# Patient Record
Sex: Male | Born: 1945
Health system: Southern US, Community
[De-identification: ages and names within clinical notes are randomized; demographics above are authoritative.]

## PROBLEM LIST (undated history)

## (undated) DIAGNOSIS — E669 Obesity, unspecified: Secondary | ICD-10-CM

## (undated) DIAGNOSIS — Z87442 Personal history of urinary calculi: Secondary | ICD-10-CM

## (undated) DIAGNOSIS — G8929 Other chronic pain: Secondary | ICD-10-CM

## (undated) DIAGNOSIS — F5104 Psychophysiologic insomnia: Secondary | ICD-10-CM

## (undated) DIAGNOSIS — E781 Pure hyperglyceridemia: Secondary | ICD-10-CM

## (undated) DIAGNOSIS — R109 Unspecified abdominal pain: Secondary | ICD-10-CM

## (undated) DIAGNOSIS — M1712 Unilateral primary osteoarthritis, left knee: Secondary | ICD-10-CM

## (undated) DIAGNOSIS — N521 Erectile dysfunction due to diseases classified elsewhere: Secondary | ICD-10-CM

## (undated) DIAGNOSIS — H11002 Unspecified pterygium of left eye: Secondary | ICD-10-CM

## (undated) DIAGNOSIS — M545 Low back pain: Secondary | ICD-10-CM

## (undated) DIAGNOSIS — M1120 Other chondrocalcinosis, unspecified site: Secondary | ICD-10-CM

## (undated) DIAGNOSIS — E1140 Type 2 diabetes mellitus with diabetic neuropathy, unspecified: Principal | ICD-10-CM

## (undated) DIAGNOSIS — K56609 Unspecified intestinal obstruction, unspecified as to partial versus complete obstruction: Secondary | ICD-10-CM

## (undated) DIAGNOSIS — I700 Atherosclerosis of aorta: Secondary | ICD-10-CM

## (undated) DIAGNOSIS — R1032 Left lower quadrant pain: Secondary | ICD-10-CM

## (undated) DIAGNOSIS — K219 Gastro-esophageal reflux disease without esophagitis: Secondary | ICD-10-CM

## (undated) DIAGNOSIS — I10 Essential (primary) hypertension: Secondary | ICD-10-CM

## (undated) DIAGNOSIS — J302 Other seasonal allergic rhinitis: Secondary | ICD-10-CM

## (undated) HISTORY — DX: Other seasonal allergic rhinitis: J30.2

## (undated) HISTORY — PX: CATARACT EXTRACTION W/ INTRAOCULAR LENS  IMPLANT, BILATERAL: SHX1307

## (undated) HISTORY — PX: HERNIA REPAIR: SHX51

## (undated) HISTORY — DX: Psychophysiologic insomnia: F51.04

## (undated) HISTORY — DX: Unspecified pterygium of left eye: H11.002

## (undated) HISTORY — DX: Essential (primary) hypertension: I10

## (undated) HISTORY — DX: Unspecified abdominal pain: R10.9

## (undated) HISTORY — DX: Low back pain: M54.5

## (undated) HISTORY — DX: Type 2 diabetes mellitus with diabetic neuropathy, unspecified: E11.40

## (undated) HISTORY — DX: Unilateral primary osteoarthritis, left knee: M17.12

## (undated) HISTORY — DX: Obesity, unspecified: E66.9

## (undated) HISTORY — DX: Pure hyperglyceridemia: E78.1

## (undated) HISTORY — DX: Unspecified intestinal obstruction, unspecified as to partial versus complete obstruction: K56.609

## (undated) HISTORY — DX: Gastro-esophageal reflux disease without esophagitis: K21.9

## (undated) HISTORY — DX: Other chronic pain: G89.29

## (undated) HISTORY — PX: ABDOMINAL SURGERY: SHX537

## (undated) HISTORY — PX: APPENDECTOMY: SHX54

## (undated) HISTORY — DX: Other chondrocalcinosis, unspecified site: M11.20

## (undated) HISTORY — DX: Left lower quadrant pain: R10.32

## (undated) HISTORY — DX: Erectile dysfunction due to diseases classified elsewhere: N52.1

---
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
## 1993-05-08 HISTORY — PX: ABDOMINAL ADHESION SURGERY: SHX90

## 1997-09-09 ENCOUNTER — Encounter: Admission: RE | Admit: 1997-09-09 | Discharge: 1997-09-09 | Payer: Self-pay | Admitting: Hematology and Oncology

## 1997-10-27 ENCOUNTER — Observation Stay (HOSPITAL_COMMUNITY): Admission: EM | Admit: 1997-10-27 | Discharge: 1997-10-28 | Payer: Self-pay | Admitting: Emergency Medicine

## 1997-10-28 ENCOUNTER — Encounter: Admission: RE | Admit: 1997-10-28 | Discharge: 1997-10-28 | Payer: Self-pay | Admitting: Internal Medicine

## 1998-05-08 HISTORY — PX: VENTRAL HERNIA REPAIR: SHX424

## 1998-10-02 ENCOUNTER — Inpatient Hospital Stay (HOSPITAL_COMMUNITY): Admission: EM | Admit: 1998-10-02 | Discharge: 1998-10-04 | Payer: Self-pay | Admitting: Emergency Medicine

## 1998-10-02 ENCOUNTER — Encounter: Payer: Self-pay | Admitting: Emergency Medicine

## 1998-10-03 ENCOUNTER — Encounter: Payer: Self-pay | Admitting: General Surgery

## 1998-10-29 ENCOUNTER — Inpatient Hospital Stay (HOSPITAL_COMMUNITY): Admission: RE | Admit: 1998-10-29 | Discharge: 1998-11-02 | Payer: Self-pay | Admitting: Surgery

## 1999-09-21 ENCOUNTER — Encounter: Admission: RE | Admit: 1999-09-21 | Discharge: 1999-09-21 | Payer: Self-pay | Admitting: Hematology and Oncology

## 1999-10-25 ENCOUNTER — Encounter: Payer: Self-pay | Admitting: Emergency Medicine

## 1999-10-25 ENCOUNTER — Inpatient Hospital Stay (HOSPITAL_COMMUNITY): Admission: EM | Admit: 1999-10-25 | Discharge: 1999-10-26 | Payer: Self-pay | Admitting: Emergency Medicine

## 1999-10-26 ENCOUNTER — Encounter: Payer: Self-pay | Admitting: Gastroenterology

## 2000-05-02 ENCOUNTER — Encounter: Admission: RE | Admit: 2000-05-02 | Discharge: 2000-05-02 | Payer: Self-pay | Admitting: Internal Medicine

## 2000-05-18 ENCOUNTER — Encounter: Admission: RE | Admit: 2000-05-18 | Discharge: 2000-05-18 | Payer: Self-pay

## 2001-03-28 ENCOUNTER — Encounter: Payer: Self-pay | Admitting: Orthopaedic Surgery

## 2001-03-28 ENCOUNTER — Ambulatory Visit (HOSPITAL_BASED_OUTPATIENT_CLINIC_OR_DEPARTMENT_OTHER): Admission: RE | Admit: 2001-03-28 | Discharge: 2001-03-28 | Payer: Self-pay | Admitting: Orthopaedic Surgery

## 2001-03-28 HISTORY — PX: SHOULDER ARTHROSCOPY: SHX128

## 2001-05-08 HISTORY — PX: ROTATOR CUFF REPAIR: SHX139

## 2001-06-12 ENCOUNTER — Encounter: Admission: RE | Admit: 2001-06-12 | Discharge: 2001-06-12 | Payer: Self-pay | Admitting: Internal Medicine

## 2001-06-20 ENCOUNTER — Encounter: Admission: RE | Admit: 2001-06-20 | Discharge: 2001-06-20 | Payer: Self-pay | Admitting: Internal Medicine

## 2001-09-04 ENCOUNTER — Encounter: Admission: RE | Admit: 2001-09-04 | Discharge: 2001-09-04 | Payer: Self-pay

## 2001-09-09 ENCOUNTER — Encounter: Admission: RE | Admit: 2001-09-09 | Discharge: 2001-09-09 | Payer: Self-pay

## 2002-03-20 ENCOUNTER — Encounter: Admission: RE | Admit: 2002-03-20 | Discharge: 2002-03-20 | Payer: Self-pay | Admitting: Internal Medicine

## 2002-03-24 ENCOUNTER — Encounter: Admission: RE | Admit: 2002-03-24 | Discharge: 2002-03-24 | Payer: Self-pay | Admitting: Internal Medicine

## 2002-05-08 HISTORY — PX: PTERYGIUM EXCISION: SHX2273

## 2002-06-20 ENCOUNTER — Encounter: Admission: RE | Admit: 2002-06-20 | Discharge: 2002-06-20 | Payer: Self-pay | Admitting: Internal Medicine

## 2002-07-03 ENCOUNTER — Encounter: Admission: RE | Admit: 2002-07-03 | Discharge: 2002-07-03 | Payer: Self-pay | Admitting: Internal Medicine

## 2003-04-08 ENCOUNTER — Encounter: Admission: RE | Admit: 2003-04-08 | Discharge: 2003-04-08 | Payer: Self-pay | Admitting: Internal Medicine

## 2003-04-27 ENCOUNTER — Encounter (INDEPENDENT_AMBULATORY_CARE_PROVIDER_SITE_OTHER): Payer: Self-pay | Admitting: *Deleted

## 2003-04-27 LAB — CONVERTED CEMR LAB: PSA: 0.67 ng/mL

## 2003-05-21 ENCOUNTER — Encounter: Admission: RE | Admit: 2003-05-21 | Discharge: 2003-05-21 | Payer: Self-pay | Admitting: Internal Medicine

## 2004-03-17 ENCOUNTER — Ambulatory Visit: Payer: Self-pay | Admitting: Internal Medicine

## 2004-03-23 ENCOUNTER — Ambulatory Visit: Payer: Self-pay | Admitting: Internal Medicine

## 2004-08-10 ENCOUNTER — Ambulatory Visit: Payer: Self-pay | Admitting: Internal Medicine

## 2004-12-12 ENCOUNTER — Ambulatory Visit (HOSPITAL_COMMUNITY): Admission: RE | Admit: 2004-12-12 | Discharge: 2004-12-12 | Payer: Self-pay | Admitting: Internal Medicine

## 2004-12-12 ENCOUNTER — Ambulatory Visit: Payer: Self-pay | Admitting: Internal Medicine

## 2004-12-19 ENCOUNTER — Ambulatory Visit: Payer: Self-pay | Admitting: Internal Medicine

## 2005-01-03 ENCOUNTER — Encounter (INDEPENDENT_AMBULATORY_CARE_PROVIDER_SITE_OTHER): Payer: Self-pay | Admitting: Cardiology

## 2005-01-03 ENCOUNTER — Ambulatory Visit: Payer: Self-pay | Admitting: Internal Medicine

## 2005-01-03 ENCOUNTER — Ambulatory Visit (HOSPITAL_COMMUNITY): Admission: RE | Admit: 2005-01-03 | Discharge: 2005-01-03 | Payer: Self-pay | Admitting: Internal Medicine

## 2005-02-02 ENCOUNTER — Ambulatory Visit (HOSPITAL_COMMUNITY): Admission: RE | Admit: 2005-02-02 | Discharge: 2005-02-02 | Payer: Self-pay | Admitting: Internal Medicine

## 2005-02-02 ENCOUNTER — Ambulatory Visit: Payer: Self-pay | Admitting: Internal Medicine

## 2005-02-08 ENCOUNTER — Ambulatory Visit (HOSPITAL_COMMUNITY): Admission: RE | Admit: 2005-02-08 | Discharge: 2005-02-08 | Payer: Self-pay | Admitting: Internal Medicine

## 2005-04-13 ENCOUNTER — Ambulatory Visit: Payer: Self-pay | Admitting: Internal Medicine

## 2005-06-26 ENCOUNTER — Ambulatory Visit: Payer: Self-pay | Admitting: Internal Medicine

## 2005-06-30 ENCOUNTER — Ambulatory Visit: Payer: Self-pay | Admitting: Internal Medicine

## 2005-10-06 ENCOUNTER — Ambulatory Visit: Payer: Self-pay | Admitting: Internal Medicine

## 2006-02-12 ENCOUNTER — Ambulatory Visit: Payer: Self-pay | Admitting: Internal Medicine

## 2006-02-15 ENCOUNTER — Ambulatory Visit: Payer: Self-pay | Admitting: Internal Medicine

## 2006-02-26 ENCOUNTER — Encounter (INDEPENDENT_AMBULATORY_CARE_PROVIDER_SITE_OTHER): Payer: Self-pay | Admitting: *Deleted

## 2006-02-26 DIAGNOSIS — K56609 Unspecified intestinal obstruction, unspecified as to partial versus complete obstruction: Secondary | ICD-10-CM | POA: Insufficient documentation

## 2006-02-26 DIAGNOSIS — N521 Erectile dysfunction due to diseases classified elsewhere: Secondary | ICD-10-CM | POA: Insufficient documentation

## 2006-02-26 DIAGNOSIS — E781 Pure hyperglyceridemia: Secondary | ICD-10-CM

## 2006-02-26 DIAGNOSIS — E7800 Pure hypercholesterolemia, unspecified: Secondary | ICD-10-CM

## 2006-02-26 DIAGNOSIS — E119 Type 2 diabetes mellitus without complications: Secondary | ICD-10-CM | POA: Insufficient documentation

## 2006-02-26 DIAGNOSIS — N489 Disorder of penis, unspecified: Secondary | ICD-10-CM | POA: Insufficient documentation

## 2006-02-26 DIAGNOSIS — K219 Gastro-esophageal reflux disease without esophagitis: Secondary | ICD-10-CM | POA: Insufficient documentation

## 2006-02-26 DIAGNOSIS — E785 Hyperlipidemia, unspecified: Secondary | ICD-10-CM | POA: Insufficient documentation

## 2006-02-26 DIAGNOSIS — Z8711 Personal history of peptic ulcer disease: Secondary | ICD-10-CM

## 2006-02-26 DIAGNOSIS — E1140 Type 2 diabetes mellitus with diabetic neuropathy, unspecified: Secondary | ICD-10-CM

## 2006-02-26 HISTORY — DX: Erectile dysfunction due to diseases classified elsewhere: N52.1

## 2006-02-26 HISTORY — DX: Gastro-esophageal reflux disease without esophagitis: K21.9

## 2006-02-26 HISTORY — DX: Pure hyperglyceridemia: E78.1

## 2006-09-24 ENCOUNTER — Encounter (INDEPENDENT_AMBULATORY_CARE_PROVIDER_SITE_OTHER): Payer: Self-pay | Admitting: Internal Medicine

## 2006-09-24 ENCOUNTER — Ambulatory Visit: Payer: Self-pay | Admitting: Internal Medicine

## 2006-09-24 LAB — CONVERTED CEMR LAB
ALT: 20 units/L (ref 0–53)
Basophils Absolute: 0 10*3/uL (ref 0.0–0.1)
Basophils Relative: 0 % (ref 0–1)
Blood Glucose, Fingerstick: 119
CO2: 22 meq/L (ref 19–32)
Cholesterol: 141 mg/dL
Creatinine, Ser: 0.85 mg/dL (ref 0.40–1.50)
Eosinophils Absolute: 0.1 10*3/uL (ref 0.0–0.7)
Eosinophils Relative: 1 % (ref 0–5)
HCT: 45.2 % (ref 39.0–52.0)
HDL: 30 mg/dL
Hemoglobin: 14.7 g/dL (ref 13.0–17.0)
Ketones, ur: NEGATIVE mg/dL
Leukocytes, UA: NEGATIVE
Lymphocytes Relative: 35 % (ref 12–46)
MCHC: 32.5 g/dL (ref 30.0–36.0)
MCV: 85.4 fL (ref 78.0–100.0)
Monocytes Absolute: 0.9 10*3/uL — ABNORMAL HIGH (ref 0.2–0.7)
Nitrite: NEGATIVE
RDW: 14 % (ref 11.5–14.0)
Specific Gravity, Urine: 1.033 (ref 1.005–1.03)
Total Bilirubin: 0.4 mg/dL (ref 0.3–1.2)
Total CK: 211 units/L (ref 7–232)
Triglyceride fasting, serum: 250 mg/dL
pH: 5.5 (ref 5.0–8.0)

## 2006-10-26 ENCOUNTER — Ambulatory Visit: Payer: Self-pay | Admitting: Internal Medicine

## 2006-10-26 ENCOUNTER — Encounter (INDEPENDENT_AMBULATORY_CARE_PROVIDER_SITE_OTHER): Payer: Self-pay | Admitting: Internal Medicine

## 2006-10-29 LAB — CONVERTED CEMR LAB
ALT: 19 units/L (ref 0–53)
AST: 16 units/L (ref 0–37)
Albumin: 4.2 g/dL (ref 3.5–5.2)
BUN: 15 mg/dL (ref 6–23)
Calcium: 8.9 mg/dL (ref 8.4–10.5)
Chloride: 107 meq/L (ref 96–112)
Potassium: 4.2 meq/L (ref 3.5–5.3)
Total Protein: 6.9 g/dL (ref 6.0–8.3)

## 2006-11-02 ENCOUNTER — Ambulatory Visit: Payer: Self-pay | Admitting: Internal Medicine

## 2006-11-02 DIAGNOSIS — H11039 Double pterygium of unspecified eye: Secondary | ICD-10-CM

## 2006-11-02 DIAGNOSIS — M1712 Unilateral primary osteoarthritis, left knee: Secondary | ICD-10-CM

## 2006-11-02 HISTORY — DX: Unilateral primary osteoarthritis, left knee: M17.12

## 2006-11-27 ENCOUNTER — Ambulatory Visit (HOSPITAL_COMMUNITY): Admission: RE | Admit: 2006-11-27 | Discharge: 2006-11-27 | Payer: Self-pay | Admitting: Orthopaedic Surgery

## 2006-12-13 ENCOUNTER — Ambulatory Visit (HOSPITAL_BASED_OUTPATIENT_CLINIC_OR_DEPARTMENT_OTHER): Admission: RE | Admit: 2006-12-13 | Discharge: 2006-12-13 | Payer: Self-pay | Admitting: Orthopaedic Surgery

## 2006-12-13 HISTORY — PX: KNEE ARTHROSCOPY: SUR90

## 2007-01-14 ENCOUNTER — Telehealth: Payer: Self-pay | Admitting: *Deleted

## 2007-02-14 ENCOUNTER — Telehealth (INDEPENDENT_AMBULATORY_CARE_PROVIDER_SITE_OTHER): Payer: Self-pay | Admitting: *Deleted

## 2007-04-12 ENCOUNTER — Encounter (INDEPENDENT_AMBULATORY_CARE_PROVIDER_SITE_OTHER): Payer: Self-pay | Admitting: *Deleted

## 2007-07-25 ENCOUNTER — Encounter (INDEPENDENT_AMBULATORY_CARE_PROVIDER_SITE_OTHER): Payer: Self-pay | Admitting: *Deleted

## 2007-07-25 ENCOUNTER — Ambulatory Visit: Payer: Self-pay | Admitting: Internal Medicine

## 2007-07-27 LAB — CONVERTED CEMR LAB
Cholesterol: 255 mg/dL — ABNORMAL HIGH (ref 0–200)
Total CHOL/HDL Ratio: 8.5
Triglycerides: 459 mg/dL — ABNORMAL HIGH (ref <150)

## 2007-09-05 ENCOUNTER — Encounter (INDEPENDENT_AMBULATORY_CARE_PROVIDER_SITE_OTHER): Payer: Self-pay | Admitting: *Deleted

## 2007-09-05 ENCOUNTER — Ambulatory Visit: Payer: Self-pay | Admitting: Internal Medicine

## 2007-09-05 DIAGNOSIS — G8929 Other chronic pain: Secondary | ICD-10-CM

## 2007-09-05 DIAGNOSIS — M545 Low back pain, unspecified: Secondary | ICD-10-CM

## 2007-09-05 HISTORY — DX: Low back pain, unspecified: M54.50

## 2007-09-05 HISTORY — DX: Other chronic pain: G89.29

## 2007-09-10 LAB — CONVERTED CEMR LAB
CO2: 21 meq/L (ref 19–32)
Cholesterol: 199 mg/dL (ref 0–200)
Creatinine, Ser: 0.97 mg/dL (ref 0.40–1.50)
Glucose, Bld: 120 mg/dL — ABNORMAL HIGH (ref 70–99)
Total Bilirubin: 0.5 mg/dL (ref 0.3–1.2)
Total CHOL/HDL Ratio: 6
Total Protein: 7.2 g/dL (ref 6.0–8.3)
Triglycerides: 290 mg/dL — ABNORMAL HIGH (ref <150)
VLDL: 58 mg/dL — ABNORMAL HIGH (ref 0–40)

## 2007-09-25 ENCOUNTER — Telehealth (INDEPENDENT_AMBULATORY_CARE_PROVIDER_SITE_OTHER): Payer: Self-pay | Admitting: *Deleted

## 2007-10-17 ENCOUNTER — Telehealth (INDEPENDENT_AMBULATORY_CARE_PROVIDER_SITE_OTHER): Payer: Self-pay | Admitting: *Deleted

## 2007-11-29 ENCOUNTER — Ambulatory Visit: Payer: Self-pay | Admitting: Internal Medicine

## 2007-11-29 ENCOUNTER — Encounter: Payer: Self-pay | Admitting: Internal Medicine

## 2007-11-29 DIAGNOSIS — N401 Enlarged prostate with lower urinary tract symptoms: Secondary | ICD-10-CM | POA: Insufficient documentation

## 2007-11-29 DIAGNOSIS — R351 Nocturia: Secondary | ICD-10-CM

## 2007-11-29 LAB — CONVERTED CEMR LAB
AST: 14 units/L (ref 0–37)
Albumin: 4.5 g/dL (ref 3.5–5.2)
BUN: 23 mg/dL (ref 6–23)
Bilirubin Urine: NEGATIVE
Blood Glucose, Fingerstick: 132
CO2: 22 meq/L (ref 19–32)
Calcium: 9.3 mg/dL (ref 8.4–10.5)
Chloride: 106 meq/L (ref 96–112)
Cholesterol: 115 mg/dL (ref 0–200)
Creatinine, Ser: 0.94 mg/dL (ref 0.40–1.50)
Glucose, Bld: 93 mg/dL (ref 70–99)
HCT: 46.2 % (ref 39.0–52.0)
HDL: 34 mg/dL — ABNORMAL LOW (ref >39)
Hemoglobin: 14.8 g/dL (ref 13.0–17.0)
Ketones, ur: NEGATIVE mg/dL
Ketones, urine, test strip: NEGATIVE
Nitrite: NEGATIVE
Protein, U semiquant: NEGATIVE
RBC: 5.41 M/uL (ref 4.22–5.81)
RDW: 13.8 % (ref 11.5–15.5)
Specific Gravity, Urine: 1.026 (ref 1.005–1.03)
Total CHOL/HDL Ratio: 3.4
Triglycerides: 174 mg/dL — ABNORMAL HIGH (ref <150)
pH: 5
pH: 5 (ref 5.0–8.0)

## 2008-03-16 ENCOUNTER — Telehealth: Payer: Self-pay | Admitting: *Deleted

## 2008-04-07 ENCOUNTER — Telehealth: Payer: Self-pay | Admitting: Internal Medicine

## 2008-04-13 ENCOUNTER — Telehealth: Payer: Self-pay | Admitting: Internal Medicine

## 2008-05-27 ENCOUNTER — Ambulatory Visit: Payer: Self-pay | Admitting: Internal Medicine

## 2008-05-27 DIAGNOSIS — N390 Urinary tract infection, site not specified: Secondary | ICD-10-CM

## 2008-05-27 DIAGNOSIS — F528 Other sexual dysfunction not due to a substance or known physiological condition: Secondary | ICD-10-CM | POA: Insufficient documentation

## 2008-05-27 DIAGNOSIS — R350 Frequency of micturition: Secondary | ICD-10-CM

## 2008-05-27 LAB — CONVERTED CEMR LAB
Blood Glucose, Fingerstick: 169
Hemoglobin, Urine: NEGATIVE
Hgb A1c MFr Bld: 6.3 %
Ketones, ur: NEGATIVE mg/dL
Leukocytes, UA: NEGATIVE
Microalb Creat Ratio: 2.7 mg/g (ref 0.0–30.0)
Microalb, Ur: 0.61 mg/dL (ref 0.00–1.89)
Nitrite: NEGATIVE
Protein, ur: NEGATIVE mg/dL
Urine Glucose: NEGATIVE mg/dL
pH: 5.5 (ref 5.0–8.0)

## 2008-05-28 ENCOUNTER — Ambulatory Visit: Payer: Self-pay | Admitting: Internal Medicine

## 2008-05-28 LAB — CONVERTED CEMR LAB
ALT: 27 units/L (ref 0–53)
CO2: 20 meq/L (ref 19–32)
Calcium: 8.8 mg/dL (ref 8.4–10.5)
Chloride: 109 meq/L (ref 96–112)
Cholesterol: 110 mg/dL (ref 0–200)
Glucose, Bld: 124 mg/dL — ABNORMAL HIGH (ref 70–99)
Prolactin: 8 ng/mL (ref 2.1–17.1)
Sodium: 142 meq/L (ref 135–145)
Testosterone: 306.07 ng/dL — ABNORMAL LOW (ref 350–890)
Total Bilirubin: 0.4 mg/dL (ref 0.3–1.2)
Total Protein: 6.8 g/dL (ref 6.0–8.3)
Triglycerides: 194 mg/dL — ABNORMAL HIGH (ref <150)

## 2008-06-01 ENCOUNTER — Telehealth: Payer: Self-pay | Admitting: *Deleted

## 2008-07-08 ENCOUNTER — Ambulatory Visit: Payer: Self-pay | Admitting: Internal Medicine

## 2008-07-08 LAB — CONVERTED CEMR LAB
Blood in Urine, dipstick: NEGATIVE
Glucose, Urine, Semiquant: NEGATIVE
Ketones, urine, test strip: NEGATIVE
Urobilinogen, UA: 0.2
WBC Urine, dipstick: NEGATIVE
pH: 5

## 2008-07-13 ENCOUNTER — Telehealth: Payer: Self-pay | Admitting: Internal Medicine

## 2008-08-05 ENCOUNTER — Encounter: Payer: Self-pay | Admitting: Internal Medicine

## 2008-08-05 ENCOUNTER — Ambulatory Visit (HOSPITAL_COMMUNITY): Admission: RE | Admit: 2008-08-05 | Discharge: 2008-08-05 | Payer: Self-pay | Admitting: Gastroenterology

## 2008-10-14 ENCOUNTER — Telehealth: Payer: Self-pay | Admitting: Internal Medicine

## 2009-01-12 ENCOUNTER — Telehealth: Payer: Self-pay | Admitting: Internal Medicine

## 2009-01-27 ENCOUNTER — Telehealth: Payer: Self-pay | Admitting: Internal Medicine

## 2009-02-04 ENCOUNTER — Ambulatory Visit: Payer: Self-pay | Admitting: Diagnostic Radiology

## 2009-02-04 ENCOUNTER — Emergency Department (HOSPITAL_BASED_OUTPATIENT_CLINIC_OR_DEPARTMENT_OTHER): Admission: EM | Admit: 2009-02-04 | Discharge: 2009-02-04 | Payer: Self-pay | Admitting: Emergency Medicine

## 2009-03-02 ENCOUNTER — Ambulatory Visit: Payer: Self-pay | Admitting: Internal Medicine

## 2009-03-02 ENCOUNTER — Telehealth: Payer: Self-pay | Admitting: Internal Medicine

## 2009-03-02 LAB — CONVERTED CEMR LAB
Blood Glucose, AC Bkfst: 122 mg/dL
Hgb A1c MFr Bld: 5.9 %

## 2009-03-03 ENCOUNTER — Encounter: Payer: Self-pay | Admitting: Internal Medicine

## 2009-03-04 LAB — CONVERTED CEMR LAB
ALT: 35 units/L (ref 0–53)
AST: 22 units/L (ref 0–37)
Albumin: 4.3 g/dL (ref 3.5–5.2)
Alkaline Phosphatase: 70 units/L (ref 39–117)
BUN: 20 mg/dL (ref 6–23)
Basophils Absolute: 0 10*3/uL (ref 0.0–0.1)
Creatinine, Ser: 0.96 mg/dL (ref 0.40–1.50)
Eosinophils Relative: 1 % (ref 0–5)
HCT: 49.8 % (ref 39.0–52.0)
HDL: 40 mg/dL (ref >39)
LDL Cholesterol: 102 mg/dL — ABNORMAL HIGH (ref 0–99)
Lymphocytes Relative: 31 % (ref 12–46)
Neutro Abs: 6.2 10*3/uL (ref 1.7–7.7)
Neutrophils Relative %: 58 % (ref 43–77)
Platelets: 286 10*3/uL (ref 150–400)
Potassium: 4.6 meq/L (ref 3.5–5.3)
RDW: 14.9 % (ref 11.5–15.5)
Total CHOL/HDL Ratio: 4.7
Total CK: 137 units/L (ref 7–232)
WBC: 10.7 10*3/uL — ABNORMAL HIGH (ref 4.0–10.5)

## 2009-04-05 ENCOUNTER — Ambulatory Visit: Payer: Self-pay | Admitting: Internal Medicine

## 2009-04-05 LAB — CONVERTED CEMR LAB
Alkaline Phosphatase: 66 units/L (ref 39–117)
BUN: 15 mg/dL (ref 6–23)
Glucose, Bld: 120 mg/dL — ABNORMAL HIGH (ref 70–99)
HDL: 29 mg/dL — ABNORMAL LOW (ref >39)
Total Bilirubin: 0.6 mg/dL (ref 0.3–1.2)
Total CHOL/HDL Ratio: 9.3
Triglycerides: 459 mg/dL — ABNORMAL HIGH (ref <150)

## 2009-04-19 ENCOUNTER — Ambulatory Visit: Payer: Self-pay | Admitting: Internal Medicine

## 2009-04-29 ENCOUNTER — Ambulatory Visit (HOSPITAL_COMMUNITY): Admission: RE | Admit: 2009-04-29 | Discharge: 2009-04-29 | Payer: Self-pay | Admitting: Gastroenterology

## 2009-06-01 ENCOUNTER — Telehealth: Payer: Self-pay | Admitting: Internal Medicine

## 2009-06-03 ENCOUNTER — Ambulatory Visit: Payer: Self-pay | Admitting: Internal Medicine

## 2009-06-03 DIAGNOSIS — R252 Cramp and spasm: Secondary | ICD-10-CM | POA: Insufficient documentation

## 2009-06-03 DIAGNOSIS — I10 Essential (primary) hypertension: Secondary | ICD-10-CM

## 2009-06-03 HISTORY — DX: Essential (primary) hypertension: I10

## 2009-06-03 LAB — CONVERTED CEMR LAB
Cholesterol: 323 mg/dL — ABNORMAL HIGH (ref 0–200)
Creatinine, Urine: 166.3 mg/dL
Microalb Creat Ratio: 3 mg/g (ref 0.0–30.0)
Microalb, Ur: 0.5 mg/dL (ref 0.00–1.89)
Triglycerides: 551 mg/dL — ABNORMAL HIGH (ref <150)

## 2009-07-12 ENCOUNTER — Telehealth: Payer: Self-pay | Admitting: Internal Medicine

## 2009-08-12 ENCOUNTER — Encounter: Payer: Self-pay | Admitting: Internal Medicine

## 2009-10-05 ENCOUNTER — Encounter: Admission: RE | Admit: 2009-10-05 | Discharge: 2009-10-05 | Payer: Self-pay | Admitting: Gastroenterology

## 2010-02-06 ENCOUNTER — Encounter: Payer: Self-pay | Admitting: Internal Medicine

## 2010-02-08 ENCOUNTER — Emergency Department (HOSPITAL_COMMUNITY): Admission: EM | Admit: 2010-02-08 | Discharge: 2010-02-08 | Payer: Self-pay | Admitting: Emergency Medicine

## 2010-02-08 ENCOUNTER — Telehealth: Payer: Self-pay | Admitting: Internal Medicine

## 2010-02-09 ENCOUNTER — Telehealth: Payer: Self-pay | Admitting: *Deleted

## 2010-02-09 ENCOUNTER — Encounter: Payer: Self-pay | Admitting: Internal Medicine

## 2010-02-16 ENCOUNTER — Encounter: Payer: Self-pay | Admitting: Internal Medicine

## 2010-02-23 ENCOUNTER — Ambulatory Visit (HOSPITAL_COMMUNITY): Admission: RE | Admit: 2010-02-23 | Discharge: 2010-02-23 | Payer: Self-pay | Admitting: Surgery

## 2010-03-07 ENCOUNTER — Encounter: Payer: Self-pay | Admitting: Internal Medicine

## 2010-03-10 ENCOUNTER — Ambulatory Visit (HOSPITAL_COMMUNITY): Admission: RE | Admit: 2010-03-10 | Discharge: 2010-03-10 | Payer: Self-pay | Admitting: Internal Medicine

## 2010-03-10 ENCOUNTER — Ambulatory Visit: Payer: Self-pay | Admitting: Internal Medicine

## 2010-03-10 DIAGNOSIS — R0789 Other chest pain: Secondary | ICD-10-CM | POA: Insufficient documentation

## 2010-03-10 DIAGNOSIS — G8929 Other chronic pain: Secondary | ICD-10-CM

## 2010-03-10 DIAGNOSIS — R109 Unspecified abdominal pain: Secondary | ICD-10-CM

## 2010-03-10 HISTORY — DX: Other chronic pain: G89.29

## 2010-03-10 LAB — CONVERTED CEMR LAB
Blood Glucose, Fingerstick: 154
Hgb A1c MFr Bld: 6 %

## 2010-03-15 ENCOUNTER — Ambulatory Visit: Payer: Self-pay | Admitting: Internal Medicine

## 2010-03-15 ENCOUNTER — Encounter: Payer: Self-pay | Admitting: *Deleted

## 2010-03-15 DIAGNOSIS — R079 Chest pain, unspecified: Secondary | ICD-10-CM | POA: Insufficient documentation

## 2010-03-21 ENCOUNTER — Telehealth (INDEPENDENT_AMBULATORY_CARE_PROVIDER_SITE_OTHER): Payer: Self-pay | Admitting: *Deleted

## 2010-03-22 ENCOUNTER — Ambulatory Visit: Payer: Self-pay | Admitting: Cardiovascular Disease

## 2010-03-22 ENCOUNTER — Encounter (HOSPITAL_COMMUNITY)
Admission: RE | Admit: 2010-03-22 | Discharge: 2010-05-07 | Payer: Self-pay | Source: Home / Self Care | Attending: Internal Medicine | Admitting: Internal Medicine

## 2010-03-22 ENCOUNTER — Ambulatory Visit: Payer: Self-pay | Admitting: Internal Medicine

## 2010-03-22 ENCOUNTER — Ambulatory Visit: Payer: Self-pay

## 2010-03-22 ENCOUNTER — Encounter: Payer: Self-pay | Admitting: Cardiovascular Disease

## 2010-03-25 LAB — CONVERTED CEMR LAB
ALT: 19 units/L (ref 0–53)
Amylase: 36 units/L (ref 0–105)
Basophils Absolute: 0 10*3/uL (ref 0.0–0.1)
Bilirubin Urine: NEGATIVE
CO2: 25 meq/L (ref 19–32)
Chloride: 103 meq/L (ref 96–112)
Creatinine, Ser: 0.82 mg/dL (ref 0.40–1.50)
HDL: 30 mg/dL — ABNORMAL LOW (ref >39)
Hemoglobin, Urine: NEGATIVE
Hemoglobin: 14.6 g/dL (ref 13.0–17.0)
Ketones, ur: NEGATIVE mg/dL
Leukocytes, UA: NEGATIVE
Lymphocytes Relative: 42 % (ref 12–46)
Monocytes Absolute: 0.7 10*3/uL (ref 0.1–1.0)
Neutro Abs: 2.9 10*3/uL (ref 1.7–7.7)
Protein, ur: NEGATIVE mg/dL
RBC: 5.22 M/uL (ref 4.22–5.81)
RDW: 13.8 % (ref 11.5–15.5)
Total CHOL/HDL Ratio: 9.8
Triglycerides: 597 mg/dL — ABNORMAL HIGH (ref <150)
Urine Glucose: NEGATIVE mg/dL
pH: 6.5 (ref 5.0–8.0)

## 2010-05-28 ENCOUNTER — Encounter: Payer: Self-pay | Admitting: Gastroenterology

## 2010-05-28 ENCOUNTER — Encounter: Payer: Self-pay | Admitting: Surgery

## 2010-05-29 ENCOUNTER — Encounter: Payer: Self-pay | Admitting: Internal Medicine
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
## 2010-06-09 NOTE — Progress Notes (Signed)
Summary: Referral  Phone Note Call from Patient   Caller: Spouse Summary of Call: Spoke with pt's wife this am. Pt needs a referral to Cardiology.  What Cardiologist would you recommend?  GI did not  specify a physician.Angelina Ok RN  February 09, 2010 10:24 AM  Initial call taken by: Angelina Ok RN,  February 09, 2010 10:23 AM  Follow-up for Phone Call        He will need to be seen and evaluated in order for [place] to make a referral. Follow-up by: [name] MD,  February 09, 2010 3:36 PM  Additional Follow-up for Phone Call Additional follow up Details #1::        Pt has an appointment with Dr. Graciela Husbands for 02/24/2010. Additional Follow-up by: Angelina Ok RN,  February 10, 2010 1:34 PM    Additional Follow-up for Phone Call Additional follow up Details #2::    I will be also happy to work out a time see Mr. Gentle apart from my regular clinic.   Follow-up by: [name] MD,  February 11, 2010 3:35 PM  Additional Follow-up for Phone Call Additional follow up Details #3:: Details for Additional Follow-up Action Taken: Pt was seen by Dr. Jamey Ripa on yesterday.  CT done yesterday at [facility] showed a Cyst on the left Kidney.  Dr. Jamey Ripa wants his PCP to do a referral if necessary.  DLoleta Chance sees Dr. Abel Presto.  Saw Dr. Matthias Hughs on Tuesday. Can be reached at  [phone] or [phone].  Angelina Ok RN  February 24, 2010 3:54 PM  Additional Follow-up by: Angelina Ok RN,  February 24, 2010 3:54 PM  Additional Follow-up Please get copies of all notes from Dr. Jamey Ripa, Dr. Matthias Hughs, and Dr. Graciela Husbands, and a copy of the CT report.  I will need to see patient in clinic.  I can see him next Wednesday afternoon at 2:00 PM if that will work for him. [name] MD  February 24, 2010 4:58 PM

## 2010-06-09 NOTE — Assessment & Plan Note (Signed)
Summary: Cardiology Nuclear Testing  Nuclear Med Background Indications for Stress Test: Evaluation for Ischemia   History: Myocardial Perfusion Study  History Comments:  ~5 yrs ago MPS: (-)  Symptoms: Chest Pain, Chest Pressure, DOE    Nuclear Pre-Procedure Cardiac Risk Factors: Family History - CAD, History of Smoking, Hypertension, Lipids, NIDDM Caffeine/Decaff Intake: none NPO After: 8:00 PM Lungs: clear IV 0.9% NS with Angio Cath: 22g     IV Site: R Hand IV Started by: Cathlyn Parsons, RN Chest Size (in) 46     Height (in): 67 Weight (lb): 215 BMI: 33.80  Nuclear Med Study 1 or 2 day study:  1 day     Stress Test Type:  Treadmill/Lexiscan Reading MD:  Charlton Haws, MD     Referring MD:  S.Klein Resting Radionuclide:  Technetium 51m Tetrofosmin     Resting Radionuclide Dose:  11 mCi  Stress Radionuclide:  Technetium 14m Tetrofosmin     Stress Radionuclide Dose:  33 mCi   Stress Protocol  Max Systolic BP: 152 mm Hg Lexiscan: 0.4 mg   Stress Test Technologist:  Milana Na, EMT-P     Nuclear Technologist:  Doyne Keel, CNMT  Rest Procedure  Myocardial perfusion imaging was performed at rest 45 minutes following the intravenous administration of Technetium 40m Tetrofosmin.  Stress Procedure  The patient received IV Lexiscan 0.4 mg over 15-seconds with concurrent low level exercise and then Technetium 61m Tetrofosmin was injected at 30-seconds while the patient continued walking one more minute.  There were no significant changes with Lexiscan.  Quantitative spect images were obtained after a 45 minute delay.  QPS Raw Data Images:  Normal; no motion artifact; normal heart/lung ratio. Stress Images:  Normal homogeneous uptake in all areas of the myocardium. Rest Images:  Normal homogeneous uptake in all areas of the myocardium. Subtraction (SDS):  Normal Transient Ischemic Dilatation:  1.04  (Normal <1.22)  Lung/Heart Ratio:  .36  (Normal  <0.45)  Quantitative Gated Spect Images QGS EDV:  109 ml QGS ESV:  40 ml QGS EF:  64 % QGS cine images:  normal  Findings Normal nuclear study      Overall Impression  Exercise Capacity: Lexiscan with no exercise. BP Response: Normal blood pressure response. Clinical Symptoms: Lightheaded ECG Impression: No significant ST segment change suggestive of ischemia. Overall Impression: Normal stress nuclear study.  Appended Document: Cardiology Nuclear Testing adv pt of normal results. Claris Gladden, RN, BSN

## 2010-06-09 NOTE — Letter (Signed)
Summary: ONE TOUCH /BLOOD GLUCOSE  ONE TOUCH /BLOOD GLUCOSE   Imported By: Margie Billet 04/04/2010 14:06:27  _____________________________________________________________________  External Attachment:    Type:   Image     Comment:   External Document

## 2010-06-09 NOTE — Letter (Signed)
Summary: [facility]  [facility]   Imported By: Margie Billet 08/23/2009 14:32:11  _____________________________________________________________________  External Attachment:    Type:   Image     Comment:   External Document  Appended Document: [facility]   Diabetic Eye Exam  Procedure date:  08/12/2009  Findings:      No background diabetic retinopathy. Exam by Marin Comment, OD, [facility]

## 2010-06-09 NOTE — Progress Notes (Signed)
Summary: Elevated sugars  Phone Note Call from Patient   Caller: Spouse Call For: [name] MD Summary of Call: Spoke with pt's wife says that pt's sugars are in the 200's.  Personnality changes.  Says that he has these changes when his sugars are up.  Has also been off of his Cholesterol meds x 5 months.  Angelina Ok RN  June 01, 2009 9:19 AM  Initial call taken by: Angelina Ok RN,  June 01, 2009 9:19 AM  Follow-up for Phone Call        I returned call and got answering machine; I left message that I could see Mr. Getter tomorrow afternoon at 3:30 or later, or first thing Thursday AM.  I asked that he let me know in the morning if either of these will work with his schedule. Follow-up by: [name] MD,  June 01, 2009 7:49 PM  Additional Follow-up for Phone Call Additional follow up Details #1::        Appointment scheduled for Thursday 1/27 at 8:30 AM. Additional Follow-up by: [name] MD,  June 03, 2009 8:31 AM

## 2010-06-09 NOTE — Assessment & Plan Note (Signed)
Summary: to see joines @ 4pm/ch   Vital Signs:  Patient profile:   65 year old male Height:      67 inches Weight:      219.8 pounds BMI:     34.55 Temp:     97.0 degrees F oral Pulse rate:   62 / minute BP sitting:   143 / 79  (right arm)  Vitals Entered By: Filomena Jungling NT II (March 10, 2010 4:07 PM) CC: checkup Is Patient Diabetic? Yes Nutritional Status BMI of > 30 = obese CBG Result 154  Have you ever been in a relationship where you felt threatened, hurt or afraid?No   Does patient need assistance? Functional Status Self care Ambulation Normal    Primary Care Provider:  [name] MD  CC:  checkup.  History of Present Illness: Patient returns for follow up of his chronic abdominal pain with recent exacerbation, diabetes mellitus, hypertension, hyperlipidemia, GERD, and other chronic problems.  He has had chronic bilateral mid abdominal pain since his abdominal surgery in 1995, but the pain worsened in early October prompting a visit to the emergency room and follow-up with his GI physician Dr. Matthias Hughs and general surgeon Dr. Jamey Ripa.  Dr. Jamey Ripa obtained a CT scan on October 19, which showed no acute abnormalities; a probable small left renal cyst was identified.  Patient reports that his midabdominal pain has improved, and although it is still present it is currently at a tolerable level of about 2/10 severity.  Other complaints include chronic GERD with heartburn and frequent reflux symptoms aggravated by lying on his back.  He also reports recent frequent episodes of difficulty swallowing, with solid food sticking and then slowly passing.  He has had some episodes of vomiting when solid foods will not pass.  He has chronic substernal chest pain that he associates with the GERD.  He stopped taking Nexium several months ago.  Regarding his diabetes, patient stopped taking metformin several months ago; he has occasionally been checking his blood sugar which ranges from the  low to mid 100s to as high as the 200s.  He also stopped taking TriCor which I had prescribed at the last visit for elevated triglycerides.     Preventive Screening-Counseling & Management  Alcohol-Tobacco     Smoking Status: quit     Year Quit: 20 years ago     Passive Smoke Exposure: no  Caffeine-Diet-Exercise     Does Patient Exercise: yes     Type of exercise: total gym     Times/week: 3  Current Medications (verified): 1)  Aspirin 81 Mg Tabs (Aspirin) .... Take 1 Tablet By Mouth Once Daily. 2)  Onetouch Ultra Test   Strp (Glucose Blood) .... Use To Test Once A Day.  Please Dispense 90 Day Supply 3)  Lancets Thin   Misc (Lancets) .... Use To Test Blood Sugar Once A Day  Allergies (verified): 1)  ! Darvocet  Family History: Family History of CAD Male 1st degree relative <50, father died of MI at 37 Mother had throat cancer, Type II DM Brother has Type II DM Brother with dysphagia No colon cancer No prostate cancer  Social History: Married Former Smoker Works as Nutritional therapist Alcohol use-less than 3 beers per week  Review of Systems General:  Denies chills, fever, and sweats. Eyes:  Denies blurring and double vision. ENT:  Denies sore throat. CV:  Denies difficulty breathing at night, difficulty breathing while lying down, fainting, and swelling of feet; has  substernal discomfort that he associates with reflux. Resp:  Denies shortness of breath. GI:  Denies bloody stools, constipation, diarrhea, nausea, and vomiting blood; see HPI. GU:  Denies dysuria and urinary hesitancy. MS:  Denies cramps. Endo:  Denies excessive thirst and excessive urination.  Physical Exam  General:  alert, no distress Lungs:  normal respiratory effort, normal breath sounds, no crackles, and no wheezes.   Heart:  normal rate, regular rhythm, no murmur, no gallop, and no rub.   Abdomen:  well-healed surgical scars are present; soft, non-tender, normal bowel sounds, no masses, no guarding, no  rigidity, no hepatomegaly, and no splenomegaly.   Extremities:  no edema; see diabetic foot exam  Diabetes Management Exam:    Foot Exam (with socks and/or shoes not present):       Sensory-Monofilament:          Left foot: normal          Right foot: normal   Impression & Recommendations:  Problem # 1:  DIABETES MELLITUS, TYPE II (ICD-250.00) Patient's hemoglobin A1c is 6.0, which indicates reasonably good control on diet alone.  His random blood sugars at home seem higher than would be expected for that hemoglobin A1C level.  I advised him to check his blood sugar regularly, and I will check labs including a repeat hemoglobin A1c and a fructosamine level.  I also advised him to bring his meter in to the clinic so we can check his reading against our blood glucose meter.  If his triglycerides remain elevated, or if otherwise indicated by the above labs, then I will consider restarting metformin.  Patient stopped his medication on his own although he had no apparent side effects.  The following medications were removed from the medication list:    Metformin Hcl 500 Mg Xr24h-tab (Metformin hcl) .Marland Kitchen... Take 1 tablet by mouth once a day His updated medication list for this problem includes:    Aspirin 81 Mg Tabs (Aspirin) .Marland Kitchen... Take 1 tablet by mouth once daily.  Labs Reviewed: Creat: 0.88 (04/05/2009)     Last Eye Exam: No background diabetic retinopathy. Exam by Marin Comment, OD, [facility] (08/12/2009) Reviewed HgBA1c results: 6.0 (03/10/2010)  6.0 (06/03/2009)  Orders: T-Hgb A1C (in-house) (30865HQ) T- Capillary Blood Glucose (82948)Future Orders: T-Urinalysis ([id number]) ... 03/11/2010 T- * Misc. Laboratory test [phone]) ... 03/11/2010  Problem # 2:  HYPERTENSION, MILD (ICD-401.1) Patient is on no medications.  Following above lab assessment, will consider addition of a low-dose ACE inhibitor.  BP today: 143/79 Prior BP: 134/90 (06/03/2009)  Labs  Reviewed: K+: 4.5 (04/05/2009) Creat: : 0.88 (04/05/2009)   Chol: 323 (06/03/2009)   HDL: 33 (06/03/2009)   LDL: * mg/dL (05/10/7251)   TG: 664 (06/03/2009)  Problem # 3:  HYPERLIPIDEMIA (ICD-272.4) Patient's triglyceride level has been markedly elevated in the past.  He stopped TriCor on his own, although he had no apparent side effects.  The plan is to check a fasting lipid panel and then base treatment recommendations upon that result.  The following medications were removed from the medication list:    Tricor 48 Mg Tabs (Fenofibrate) .Marland Kitchen... Take 1 tablet by mouth once a day  Labs Reviewed: SGOT: 14 (04/05/2009)   SGPT: 20 (04/05/2009)   HDL:33 (06/03/2009), 29 (04/05/2009)  LDL:* mg/dL (40/34/7425), * mg/dL (95/63/8756)  EPPI:951 (06/03/2009), 269 (04/05/2009)  Trig:551 (06/03/2009), 459 (04/05/2009)  Future Orders: T-Lipid Profile ([id number]) ... 03/11/2010  Problem # 4:  ABDOMINAL PAIN (ICD-789.00) Patient has chronic  mid abdominal pain with an unremarkable CT scan done recently by Dr. Jamey Ripa.  His pain has improved, and Dr. Jamey Ripa did not feel that he would benefit from surgery without a clear indication.  The plan is to check labs as below.  Future Orders: T-Comprehensive Metabolic Panel [phone]) ... 03/11/2010 T-CBC w/Diff ([id number]) ... 03/11/2010 T-Lipase [phone]) ... 03/11/2010 T-Amylase ([id number]) ... 03/11/2010 T-TSH [phone]) ... 03/11/2010  Problem # 5:  GERD (ICD-530.81) Patient reports prominent symptoms of GERD with dysphagia to solid foods.  I advised that he see Dr. Matthias Hughs again for consideration of an EGD.  Will restart Nexium at previous dose.  The following medications were removed from the medication list:    Nexium 40 Mg Cpdr (Esomeprazole magnesium) .Marland Kitchen... Take 1 tablet by mouth two times a day His updated medication list for this problem includes:    Nexium 40 Mg Cpdr (Esomeprazole magnesium) .Marland Kitchen... Take 1 capsule by mouth once a  day  Labs Reviewed: Hgb: 15.5 (03/03/2009)   Hct: 49.8 (03/03/2009)  Problem # 6:  CHEST PAIN, ATYPICAL (ICD-786.59) Patient has chronic atypical chest pain which is likely related to his GERD.  He was referred from the emergency room for cardiology evaluation last month, and has an upcoming appointment with Dr. Graciela Husbands.  Given his risk factors, I advised him to keep that appointment.  Orders: CXR- 2view (CXR)  Complete Medication List: 1)  Aspirin 81 Mg Tabs (Aspirin) .... Take 1 tablet by mouth once daily. 2)  Onetouch Ultra Test Strp (Glucose blood) .... Use to test once a day.  please dispense 90 day supply 3)  Lancets Thin Misc (Lancets) .... Use to test blood sugar once a day 4)  Nexium 40 Mg Cpdr (Esomeprazole magnesium) .... Take 1 capsule by mouth once a day  Patient Instructions: 1)  Please schedule a follow-up appointment in 1 month. 2)  Please return next week for fasting blood work. 3)  Start Nexium 40 mg once daily.  Prescriptions: NEXIUM 40 MG CPDR (ESOMEPRAZOLE MAGNESIUM) Take 1 capsule by mouth once a day  #31 x 11   Entered and Authorized by:   [name] MD   Signed by:   [name] MD on 03/10/2010   Method used:   Print then Give to Patient   RxID:   [id number]    Orders Added: 1)  T-Hgb A1C (in-house) [41660YT] 2)  CXR- 2view [CXR] 3)  T-Comprehensive Metabolic Panel [[id number]] 4)  T-CBC w/Diff [[id number]] 5)  T-Lipid Profile [[id number]] 6)  T-Lipase [[id number]] 7)  T-Amylase [[id number]] 8)  T-TSH [[id number]] 9)  T-Urinalysis [81003-65000] 10)  T- * Misc. Laboratory test [99999] 11)  T- Capillary Blood Glucose [82948] 12)  Est. Patient Level IV [27062]    Prevention & Chronic Care Immunizations   Influenza vaccine: Fluvax 3+  (04/05/2009)   Influenza vaccine deferral: Refused  (03/02/2009)    Tetanus booster: 10/06/2005: Tdap [facility])    Pneumococcal vaccine: Not documented    H. zoster vaccine: Not  documented  Colorectal Screening   Hemoccult: negative X3  (04/19/2009)   Hemoccult action/deferral: Ordered  (04/05/2009)    Colonoscopy: Normal exam to the cecum. Exam by Bernette Redbird, MD   (04/29/2009)   Colonoscopy action/deferral: GI referral  (04/05/2009)   Colonoscopy due: 05/2019  Other Screening   PSA: 0.51  (06/03/2009)   PSA action/deferral: Discussed-PSA requested  (06/03/2009)   Smoking status: quit  (03/10/2010)  Diabetes Mellitus   HgbA1C: 6.0  (03/10/2010)    Eye exam:  No background diabetic retinopathy. Exam by Marin Comment, OD, [facility]  (08/12/2009)   Diabetic eye exam action/deferral: Ophthalmology referral  (06/03/2009)   Eye exam due: 04/2008    Foot exam: yes  (03/10/2010)   Foot exam action/deferral: Do today   High risk foot: No  (03/10/2010)   Foot care education: Done  (03/10/2010)   Foot exam due: 03/11/2011    Urine microalbumin/creatinine ratio: 3.0  (06/03/2009)   Urine microalbumin action/deferral: Ordered    Diabetes flowsheet reviewed?: Yes   Progress toward A1C goal: At goal  Lipids   Total Cholesterol: 323  (06/03/2009)   Lipid panel action/deferral: LDL Direct Ordered   LDL: * mg/dL  (04/54/0981)   LDL Direct: 64  (06/03/2009)   HDL: 33  (06/03/2009)   Triglycerides: 551  (06/03/2009)    SGOT (AST): 14  (04/05/2009)   SGPT (ALT): 20  (04/05/2009) CMP ordered    Alkaline phosphatase: 66  (04/05/2009)   Total bilirubin: 0.6  (04/05/2009)    Lipid flowsheet reviewed?: Yes   Progress toward LDL goal: Unchanged  Hypertension   Last Blood Pressure: 143 / 79  (03/10/2010)   Serum creatinine: 0.88  (04/05/2009)   Serum potassium 4.5  (04/05/2009) CMP ordered     Hypertension flowsheet reviewed?: Yes   Progress toward BP goal: Unchanged  Self-Management Support :   Personal Goals (by the next clinic visit) :     Personal A1C goal: 7  (04/05/2009)     Personal blood pressure goal: 130/80  (04/05/2009)      Personal LDL goal: 100  (04/05/2009)    Patient will work on the following items until the next clinic visit to reach self-care goals:     Medications and monitoring: take my medicines every day, check my blood sugar  (03/10/2010)     Eating: drink diet soda or water instead of juice or soda, eat more vegetables, use fresh or frozen vegetables, eat foods that are low in salt, eat baked foods instead of fried foods, eat fruit for snacks and desserts  (03/10/2010)     Activity: take a 30 minute walk every day, take the stairs instead of the elevator  (03/10/2010)     Other: total gym upper body workout 4 days a week- takes Cinammon pills to help blood sugar and OTC for muscle  (04/05/2009)    Diabetes self-management support: Education handout  (03/10/2010)   Diabetes education handout printed    Hypertension self-management support: Education handout  (03/10/2010)   Hypertension education handout printed    Lipid self-management support: Education handout  (03/10/2010)     Lipid education handout printed   Nursing Instructions: Diabetic foot exam today Give Flu vaccine today    Diabetic Foot Exam Foot Inspection Is there a history of a foot ulcer?              No Is there a foot ulcer now?              No Can the patient see the bottom of their feet?          No Are the shoes appropriate in style and fit?          Yes Is there swelling or an abnormal foot shape?          No Are the toenails long?                No Are the toenails thick?  No Are the toenails ingrown?              No Is there heavy callous build-up?              No Is there pain in the calf muscle (Intermittent claudication) when walking?    NoIs there a claw toe deformity?              No Is there elevated skin temperature?            No Is there limited ankle dorsiflexion?            No Is there foot or ankle muscle weakness?            No  Diabetic Foot Care Education Patient educated on  appropriate care of diabetic feet.  Pulse Check          Right Foot          Left Foot Posterior Tibial:        normal            normal Dorsalis Pedis:        normal            normal  High Risk Feet? No Set Next Diabetic Foot Exam here: 03/11/2011   10-g (5.07) Semmes-Weinstein Monofilament Test Performed by: Filomena Jungling NT II          Right Foot          Left Foot Visual Inspection     normal           normal Site 1         normal         normal Site 2         normal         normal Site 3         normal         normal Site 4         normal         normal Site 5         normal         normal Site 6         normal         normal Site 7         normal         normal Site 8         normal         normal Site 9         normal         normal  Impression      normal         normal   Laboratory Results   Blood Tests   Date/Time Received: March 10, 2010 4:33 PM Date/Time Reported: Alric Quan  March 10, 2010 4:33 PM   HGBA1C: 6.0%   (Normal Range: Non-Diabetic - 3-6%   Control Diabetic - 6-8%) CBG Random:: 154mg /dL    Process Orders Check Orders Results:     [facility]: Order checked:     [phone] -- T- * Misc. Laboratory test -- No ABN rules found (CPT: ) Tests Sent for requisitioning (March 11, 2010 6:05 PM):     03/11/2010: [facility] -- T-Comprehensive Metabolic Panel [[id number]] (signed)     03/11/2010: [facility] -- T-CBC w/Diff [[id number]] (signed)     03/11/2010: [facility] --  T-Lipid Profile [[id number]] (signed)     03/11/2010: [facility] -- T-Lipase [phone] (signed)     03/11/2010: [facility] -- T-Amylase [phone] (signed)     03/11/2010: [facility] -- T-TSH [id number] (signed)     03/11/2010: [facility] -- T-Urinalysis [[id number]] (signed)     03/11/2010: [facility] -- T- * Misc.  Laboratory test [phone] (signed)

## 2010-06-09 NOTE — Assessment & Plan Note (Signed)
Summary: injection/gg  Nurse Visit   Allergies: 1)  ! Darvocet  Orders Added: 1)  Flu Vaccine 60yrs + MEDICARE PATIENTS [Q2039] 2)  Administration Flu vaccine - MCR [G0008] Flu Vaccine Consent Questions     Do you have a history of severe allergic reactions to this vaccine? no    Any prior history of allergic reactions to egg and/or gelatin? no    Do you have a sensitivity to the preservative Thimersol? no    Do you have a past history of Guillan-Barre Syndrome? no    Do you currently have an acute febrile illness? no    Have you ever had a severe reaction to latex? no    Vaccine information given and explained to patient? yes    Are you currently pregnant? no    Lot Number:[id number]   Exp Date:11/05/2010   Manufacturer: [employer]    Site Given  Left Deltoid IMInjection given on 03/10/2010

## 2010-06-09 NOTE — Progress Notes (Signed)
Summary: Nuclear Pre-Procedure  Phone Note Outgoing Call Call back at [facility] Phone [phone]   Call placed by: Stanton Kidney, EMT-P,  March 21, 2010 12:14 PM Action Taken: Phone Call Completed Summary of Call: Left message with information on [employer] Information Sheet (see scanned document for details).     Nuclear Med Background Indications for Stress Test: Evaluation for Ischemia   History: Myocardial Perfusion Study  History Comments:  ~5 yrs ago MPS: (-)  Symptoms: Chest Pain    Nuclear Pre-Procedure Cardiac Risk Factors: Family History - CAD, History of Smoking, Hypertension, Lipids, NIDDM Height (in): 67

## 2010-06-09 NOTE — Consult Note (Signed)
Summary: [facility]  [facility]   Imported By: Louretta Parma 03/04/2010 16:49:23  _____________________________________________________________________  External Attachment:    Type:   Image     Comment:   External Document

## 2010-06-09 NOTE — Progress Notes (Signed)
----   Converted from flag ---- ---- 02/08/2010 1:42 PM, Angelina Ok RN wrote: Dr. Vanessa Ralphs,  Spoke with D. Loleta Chance pt's wife.  Said he had a lot of abdominal pain on yesterday.  Unable to sleep well last night.  Said that he is not bleeding from mouth or rectum.  Burning in chest.  Stanton Kidney said that his abdomen is hard.  Call to Dr. Matthias Hughs who is meeting pt in the ED today. Angelina Ok, RN October 4,2011 1:41 PM ------------------------------
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
## 2010-06-09 NOTE — Progress Notes (Signed)
Summary: refill/ hla  Phone Note Refill Request Message from:  Patient on July 12, 2009 5:01 PM  Refills Requested: Medication #1:  ONETOUCH ULTRA TEST   STRP use to test once a day.  please dispense 90 day supply [BMN]   Last Refilled: 2/8 Initial call taken by: Marin Roberts RN,  July 12, 2009 5:02 PM  Follow-up for Phone Call        Refilled electronically.  Follow-up by: [name] MD,  July 12, 2009 5:12 PM    Prescriptions: Koren Bound TEST   STRP (GLUCOSE BLOOD) use to test once a day.  please dispense 90 day supply Brand medically necessary #100 x 3   Entered and Authorized by:   [name] MD   Signed by:   [name] MD on 07/12/2009   Method used:   Electronically to        [facility] (retail)       [address]       [place], [place]  [address]       Ph: [phone]       Fax: [phone]   RxID:   [id number]

## 2010-06-09 NOTE — Consult Note (Signed)
[facility]   Imported By: Louretta Parma 02/28/2010 17:00:46  _____________________________________________________________________  External Attachment:    Type:   Image     Comment:   External Document

## 2010-06-09 NOTE — Letter (Signed)
Summary: [facility]   Imported By: Margie Billet 03/21/2010 15:49:36  _____________________________________________________________________  External Attachment:    Type:   Image     Comment:   External Document

## 2010-07-19 LAB — GLUCOSE, CAPILLARY: Glucose-Capillary: 154 mg/dL — ABNORMAL HIGH (ref 70–99)

## 2010-07-20 LAB — DIFFERENTIAL
Basophils Absolute: 0 10*3/uL (ref 0.0–0.1)
Basophils Relative: 0 % (ref 0–1)
Eosinophils Relative: 1 % (ref 0–5)
Lymphocytes Relative: 37 % (ref 12–46)
Monocytes Absolute: 0.9 10*3/uL (ref 0.1–1.0)
Monocytes Relative: 10 % (ref 3–12)
Neutro Abs: 4.3 10*3/uL (ref 1.7–7.7)

## 2010-07-20 LAB — CBC
Platelets: 218 10*3/uL (ref 150–400)
RBC: 5 MIL/uL (ref 4.22–5.81)
RDW: 13.4 % (ref 11.5–15.5)
WBC: 8.3 10*3/uL (ref 4.0–10.5)

## 2010-07-20 LAB — COMPREHENSIVE METABOLIC PANEL
AST: 21 U/L (ref 0–37)
Albumin: 3.7 g/dL (ref 3.5–5.2)
Alkaline Phosphatase: 67 U/L (ref 39–117)
Chloride: 106 mEq/L (ref 96–112)
GFR calc Af Amer: >60 mL/min (ref >60)
Potassium: 3.7 mEq/L (ref 3.5–5.1)
Sodium: 134 mEq/L — ABNORMAL LOW (ref 135–145)
Total Bilirubin: 0.6 mg/dL (ref 0.3–1.2)
Total Protein: 6.7 g/dL (ref 6.0–8.3)

## 2010-07-20 LAB — URINE CULTURE
Colony Count: NO GROWTH
Culture: NO GROWTH

## 2010-07-20 LAB — URINALYSIS, ROUTINE W REFLEX MICROSCOPIC
Bilirubin Urine: NEGATIVE
Glucose, UA: NEGATIVE mg/dL
Hgb urine dipstick: NEGATIVE
Specific Gravity, Urine: 1.02 (ref 1.005–1.030)
Urobilinogen, UA: 0.2 mg/dL (ref 0.0–1.0)

## 2010-08-10 LAB — GLUCOSE, CAPILLARY: Glucose-Capillary: 122 mg/dL — ABNORMAL HIGH (ref 70–99)

## 2010-08-11 LAB — GLUCOSE, CAPILLARY: Glucose-Capillary: 122 mg/dL — ABNORMAL HIGH (ref 70–99)

## 2010-08-22 LAB — GLUCOSE, CAPILLARY: Glucose-Capillary: 169 mg/dL — ABNORMAL HIGH (ref 70–99)

## 2010-09-08 ENCOUNTER — Other Ambulatory Visit: Payer: Self-pay | Admitting: *Deleted

## 2010-09-09 MED ORDER — FENOFIBRATE 48 MG PO TABS: 48.0000 mg | ORAL_TABLET | Freq: Every day | ORAL | Status: DC
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
## 2010-09-23 NOTE — Discharge Summary (Signed)
[facility]  Patient:    Tyler Bridges, Tyler Bridges                         MRN: [id number] Adm. Date:  [id number] Disc. Date: [id number] Attending:  Rich Brave Dictator:   Burgess Amor, P.A. CC:         Currie Paris, M.D.                           Discharge Summary  REASON FOR ADMISSION:  Abdominal pain, history of small bowel obstruction.  FINAL DIAGNOSIS:  Partial small bowel obstruction, resolved.  CONSULTATIONS:  None.  COMPLICATIONS:  None.  PROCEDURES:  None.  LABORATORY:  A CBC was obtained on the patients admission which revealed a white blood cell count of 8.5, hemoglobin 14.3 with a hematocrit of 42.1.  He had a platelet count of 293,000.  A repeat CBC obtained the morning of October 26, 1999 was essentially unremarkable with the exception of a slightly increased white blood cell count of 10.2 with a normal differential. Additionally on the date of admission, he had a SMA-7 with a sodium of 139, potassium 4.0, chloride 107, bicarbonate 24, BUN 17 and a creatinine of 1.1. Glucose was 195.  Calcium was 9.0.  Total protein was 6.4, albumin 3.6, AST 37, ALT 34, alkaline phosphatase 60, total bilirubin 1.1 and a lipase of 31. On the morning of October 26, 1999, a BMP was repeated and his values were essentially unchanged.  On October 25, 1999, he also had a urinalysis which was negative.  HOSPITAL COURSE:  The patient was admitted on the morning of October 25, 1999 due to an approximate eight hour history of severe left lower quadrant abdominal pain which was consistent with previous episodes of small bowel obstruction for which he has been admitted for supportive care in the past.  Although, he had no peritoneal findings and no guarding, because he had a left lower quadrant area of pain, he was empirically treated for the possibility of diverticulitis with intravenous Unasyn.  He was admitted with a clear liquid diet as tolerated, and by the morning  of October 26, 1999 was increased to a low residue diet which he tolerated well.  He also received adequate pain control with intravenous morphine over the course of this 24 hour admission, and his last dose of morphine was approximately 4:00 AM on the morning of October 26, 1999.  By 10:00 AM that morning, he was essentially without pain and as mentioned above, tolerated a low residue diet well without nausea, vomiting or an increase in abdominal symptoms.  The patient was promptly discharged home, for further outpatient management of symptoms.  DISPOSITION:  The patient was discharged to home.  He was not given a follow-up office visit appointment with Dr. Matthias Hughs, as the patient has such a long experience with his partial obstructions.  He is well versed in symptoms and signs to look for and he was advised to contact our office if he develops further abdominal pain, fevers or diarrhea which could be a possible complication post antibiotic exposure.  He will not be discharged on antibiotics, as we feel there is low likelihood that this was indeed a case of diverticulitis.  DISCHARGE DIET:  Low residue, increasing to a regular diet as tolerated over the next several days.  ACTIVITY LEVEL:  As tolerated.  MEDICATIONS:  Not applicable.  CONDITION ON  DISCHARGE:  Improved. DD:  10/26/99 TD:  10/27/99 Job: [id number]

## 2010-09-28 ENCOUNTER — Ambulatory Visit: Payer: Self-pay | Admitting: Internal Medicine

## 2010-11-11 ENCOUNTER — Other Ambulatory Visit: Payer: Self-pay | Admitting: Internal Medicine

## 2010-11-11 DIAGNOSIS — E7800 Pure hypercholesterolemia, unspecified: Secondary | ICD-10-CM

## 2010-11-11 DIAGNOSIS — E119 Type 2 diabetes mellitus without complications: Secondary | ICD-10-CM

## 2010-11-11 DIAGNOSIS — R252 Cramp and spasm: Secondary | ICD-10-CM

## 2010-11-11 DIAGNOSIS — E785 Hyperlipidemia, unspecified: Secondary | ICD-10-CM

## 2010-11-14 ENCOUNTER — Other Ambulatory Visit: Payer: Self-pay

## 2010-11-14 DIAGNOSIS — E785 Hyperlipidemia, unspecified: Secondary | ICD-10-CM

## 2010-11-14 DIAGNOSIS — E119 Type 2 diabetes mellitus without complications: Secondary | ICD-10-CM

## 2010-11-14 DIAGNOSIS — R252 Cramp and spasm: Secondary | ICD-10-CM

## 2010-11-14 LAB — CBC WITH DIFFERENTIAL/PLATELET
Basophils Absolute: 0 10*3/uL (ref 0.0–0.1)
Basophils Relative: 1 % (ref 0–1)
HCT: 45.2 % (ref 39.0–52.0)
MCHC: 31.9 g/dL (ref 30.0–36.0)
Monocytes Absolute: 0.7 10*3/uL (ref 0.1–1.0)
Neutro Abs: 3.2 10*3/uL (ref 1.7–7.7)
Platelets: 253 10*3/uL (ref 150–400)
RDW: 14.2 % (ref 11.5–15.5)

## 2010-11-14 LAB — LIPID PANEL
Cholesterol: 344 mg/dL — ABNORMAL HIGH (ref 0–200)
Total CHOL/HDL Ratio: 11.5 Ratio

## 2010-11-14 LAB — TSH: TSH: 1.355 u[IU]/mL (ref 0.350–4.500)

## 2010-11-14 LAB — CK: Total CK: 125 U/L (ref 7–232)

## 2010-11-15 LAB — COMPLETE METABOLIC PANEL WITH GFR
ALT: 19 U/L (ref 0–53)
AST: 16 U/L (ref 0–37)
Albumin: 4 g/dL (ref 3.5–5.2)
Alkaline Phosphatase: 73 U/L (ref 39–117)
Calcium: 9.1 mg/dL (ref 8.4–10.5)
Chloride: 104 mEq/L (ref 96–112)
Potassium: 4.2 mEq/L (ref 3.5–5.3)
Sodium: 137 mEq/L (ref 135–145)
Total Protein: 6.8 g/dL (ref 6.0–8.3)

## 2010-11-16 ENCOUNTER — Encounter: Payer: Self-pay | Admitting: Internal Medicine

## 2010-11-16 ENCOUNTER — Ambulatory Visit (INDEPENDENT_AMBULATORY_CARE_PROVIDER_SITE_OTHER): Payer: Self-pay | Admitting: Internal Medicine

## 2010-11-16 VITALS — BP 147/88 | HR 59 | Temp 97.5°F | Resp 20 | Ht 68.0 in | Wt 220.4 lb

## 2010-11-16 DIAGNOSIS — E7800 Pure hypercholesterolemia, unspecified: Secondary | ICD-10-CM

## 2010-11-16 DIAGNOSIS — E781 Pure hyperglyceridemia: Secondary | ICD-10-CM

## 2010-11-16 DIAGNOSIS — R252 Cramp and spasm: Secondary | ICD-10-CM

## 2010-11-16 DIAGNOSIS — E119 Type 2 diabetes mellitus without complications: Secondary | ICD-10-CM

## 2010-11-16 LAB — GLUCOSE, CAPILLARY: Glucose-Capillary: 214 mg/dL — ABNORMAL HIGH (ref 70–99)

## 2010-11-16 MED ORDER — METFORMIN HCL ER 500 MG PO TB24: 500.0000 mg | ORAL_TABLET | Freq: Every day | ORAL | Status: DC

## 2010-11-16 NOTE — Assessment & Plan Note (Addendum)
Triglycerides  Date Value Range Status  11/14/2010 898* <150 (mg/dL) Final  16/02/9603 540* (<150) (mg/dL) Final  9/81/1914 782* (<150) (mg/dL) Final    Triglycerides are markedly elevated, and patient was on TriCor until this was held at the end of last week.  After further discussing his recent exacerbation of cramps with patient, I do not think these were clearly related to the TriCor, and the patient also does not feel these were  TriCor related.  I advised him to restart the TriCor and let me know if he has any recurrence of the cramps.  I also advised that he stay well hydrated when working in the heat and sweating.  His diabetes has been managed by diet only, and as above, given his hyperglycemia the plan is to start metformin for diabetes; this may also help control his hypertriglyceridemia.  Will recheck a fasting lipid panel in about 3 months.

## 2010-11-16 NOTE — Progress Notes (Signed)
  Subjective:    Patient ID: Tyler Bridges, male    DOB: 24-Oct-1945, 64 y.o.   MRN: [id number]  HPI Patient returns for followup of his diabetes mellitus, hyperlipidemia, and other chronic medical problems.  He developed recent worsening of chronic cramps last week while working in the heat and sweating profusely; I advised on Friday that he hold the TriCor until his visit today, and he has not taken the TriCor for about 5 days.  He does not feel that the TriCor is the cause of the cramps; when he previously stopped TriCor, they did not resolve.  Instead he feels that the recent worsening of the cramps was related to sweating with inadequate oral volume replacement.  Today he has no acute complaints.     Review of Systems  Constitutional: Negative for fever, chills and diaphoresis.  Respiratory: Negative for shortness of breath and wheezing.   Cardiovascular: Negative for chest pain, palpitations and leg swelling.  Gastrointestinal: Negative for nausea, vomiting and blood in stool.  Genitourinary: Negative for dysuria.       Objective:   Physical Exam  Constitutional: No distress.  Cardiovascular: Normal rate, regular rhythm and normal heart sounds.  Exam reveals no gallop and no friction rub.   No murmur heard.      No lower extremity edema.  Pulmonary/Chest: Effort normal and breath sounds normal. He has no wheezes. He has no rales.  Abdominal: Soft. Bowel sounds are normal. He exhibits no distension. There is no hepatosplenomegaly. There is no tenderness. There is no rebound and no guarding.          Assessment & Plan:

## 2010-11-16 NOTE — Patient Instructions (Signed)
Restart Tricor and call if cramps recur. Start metformin XR 500 mg daily. Please make appointment with diabetes educator Jamison Neighbor. Check blood sugar twice a day for one week and call or bring results to clinic. Please see eye doctor for annual eye exam.

## 2010-11-16 NOTE — Assessment & Plan Note (Signed)
And LDL could not be calculated on recent lipid panel due to the elevated triglycerides.  The plan is to add a direct LDL measurement to the blood drawn 2 days ago.

## 2010-11-16 NOTE — Assessment & Plan Note (Signed)
Patient has chronic muscle cramps, with a recent exacerbation after working outside in the heat and sweating profusely without adequate oral volume replacement.  His cramps have resolved.  I advised him to let me know if he has any recurrence of cramps after restarting his TriCor.

## 2010-11-16 NOTE — Assessment & Plan Note (Signed)
Hemoglobin A1C  Date Value Range Status  11/16/2010 6.2   Final  03/22/2010 6.2* (<5.7) (%) Final    Assessment: Although the patient's hemoglobin A1c is 6.2 on diet alone, his home blood glucose measurements are often above 170, with a recent mean value of 186.  I am concerned that his hyperglycemia is contributing to his marked hypertriglyceridemia.  The plan is to start metformin XR 500 mg daily; patient was on this medication in the past without any problems.  I will also refer him to Jamison Neighbor for medical nutrition therapy for diabetes.  I advised him to check his blood sugars twice a day for one week and bring the record in the clinic, and then to check a fasting blood sugar daily.  Plan: Diabetes treatment: Start metformin XR 500 mg daily Refer to: diabetes educator for self-management training and diabetes educator for medical nutrition therapy Instruction/counseling given: reminded to get eye exam

## 2010-11-17 ENCOUNTER — Encounter: Payer: Self-pay | Admitting: Internal Medicine

## 2010-11-26 ENCOUNTER — Other Ambulatory Visit: Payer: Self-pay | Admitting: Internal Medicine

## 2010-11-29 ENCOUNTER — Ambulatory Visit: Payer: Self-pay | Admitting: Dietician

## 2011-01-10 ENCOUNTER — Other Ambulatory Visit: Payer: Self-pay | Admitting: *Deleted

## 2011-01-10 DIAGNOSIS — E119 Type 2 diabetes mellitus without complications: Secondary | ICD-10-CM

## 2011-01-10 NOTE — Telephone Encounter (Signed)
Pt request one touch ultra test strips.  I can't find in epic, please enter.

## 2011-01-12 MED ORDER — GLUCOSE BLOOD VI STRP: ORAL_STRIP | Status: DC

## 2011-02-20 LAB — BASIC METABOLIC PANEL
BUN: 17
CO2: 26
Glucose, Bld: 98
Potassium: 4.3
Sodium: 139

## 2011-04-29 ENCOUNTER — Other Ambulatory Visit: Payer: Self-pay | Admitting: Internal Medicine

## 2011-05-04 NOTE — Telephone Encounter (Signed)
Message sent to front desk for an appt. 

## 2011-05-04 NOTE — Telephone Encounter (Signed)
Refill approved; please schedule a follow up appointment in my clinic when available.

## 2011-06-21 ENCOUNTER — Encounter: Payer: Self-pay | Admitting: Internal Medicine

## 2011-06-21 ENCOUNTER — Ambulatory Visit (INDEPENDENT_AMBULATORY_CARE_PROVIDER_SITE_OTHER): Payer: Medicare Other | Admitting: Internal Medicine

## 2011-06-21 VITALS — BP 148/70 | HR 60 | Temp 96.7°F | Ht 68.0 in | Wt 222.6 lb

## 2011-06-21 DIAGNOSIS — Z79899 Other long term (current) drug therapy: Secondary | ICD-10-CM

## 2011-06-21 DIAGNOSIS — E781 Pure hyperglyceridemia: Secondary | ICD-10-CM

## 2011-06-21 DIAGNOSIS — E119 Type 2 diabetes mellitus without complications: Secondary | ICD-10-CM

## 2011-06-21 DIAGNOSIS — Z23 Encounter for immunization: Secondary | ICD-10-CM

## 2011-06-21 DIAGNOSIS — I10 Essential (primary) hypertension: Secondary | ICD-10-CM

## 2011-06-21 DIAGNOSIS — Z125 Encounter for screening for malignant neoplasm of prostate: Secondary | ICD-10-CM

## 2011-06-21 LAB — COMPLETE METABOLIC PANEL WITH GFR
Albumin: 4.2 g/dL (ref 3.5–5.2)
CO2: 23 mEq/L (ref 19–32)
Calcium: 9.3 mg/dL (ref 8.4–10.5)
GFR, Est African American: >89 mL/min
GFR, Est Non African American: 89 mL/min
Glucose, Bld: 147 mg/dL — ABNORMAL HIGH (ref 70–99)
Potassium: 4.6 mEq/L (ref 3.5–5.3)
Sodium: 139 mEq/L (ref 135–145)
Total Protein: 7 g/dL (ref 6.0–8.3)

## 2011-06-21 LAB — LIPID PANEL
HDL: 30 mg/dL — ABNORMAL LOW (ref >39)
Triglycerides: 451 mg/dL — ABNORMAL HIGH (ref <150)

## 2011-06-21 NOTE — Assessment & Plan Note (Signed)
BP Readings from Last 3 Encounters:  06/21/11 148/70  11/16/10 147/88  03/15/10 112/85     Assessment: Patient's systolic blood pressure is mildly elevated today as it was at the previous visit.  He is currently on no medications.  Plan: I advised patient to check his blood pressure periodically and bring the record to the next clinic visit.  If his systolic blood pressure remains elevated, then I will start an antihypertensive medication.

## 2011-06-21 NOTE — Assessment & Plan Note (Signed)
Hemoglobin A1C  Date Value Range Status  06/21/2011 6.5   Final     CBG Fasting 156  11/16/2010 6.2   Final     Assessment: Diabetes control: controlled Progress toward goals: at goal Barriers to meeting goals: no barriers identified  Plan: Diabetes treatment: continue current medications Refer to: none Instruction/counseling given: reminded to get eye exam, reminded to bring blood glucose meter & log to each visit and reminded to bring medications to each visit

## 2011-06-21 NOTE — Assessment & Plan Note (Addendum)
Lipids:    Component Value Date/Time   CHOL 344* 11/14/2010 0837   TRIG 898* 11/14/2010 0837   HDL 30* 11/14/2010 0837   LDLCALC NOT CALC 11/14/2010 0837   LDLDIRECT 68 11/14/2010 1018   VLDL NOT CALC 11/14/2010 0837   CHOLHDL 11.5 11/14/2010 0837    Assessment: Patient is doing well on TriCor at current dose, without apparent side effects.  Plan: Continue TriCor; check a lipid panel today (patient reports that he is fasting).

## 2011-06-21 NOTE — Progress Notes (Signed)
  Subjective:    Patient ID: Tyler Bridges, male    DOB: [birth year], 66 y.o.   MRN: [id number]  HPI Patient returns for followup of his diabetes mellitus, hypertriglyceridemia, and other chronic medical problems.  He is doing well today, without any acute complaints.  He reports that he saw orthopedic surgeon Dr. Cleophas Dunker about one month ago for low back pain and that Dr. Cleophas Dunker did plain x-rays and treated him symptomatically.  He reports that his back pain has resolved.  He has not been checking his blood sugars frequently.  Patient requests a PSA testing today, and we have had discussions in the past about the pros and cons of PSA screening and he has elected to have the screening.  I talked with him again today about the pros and cons of screening, and specifically about the current USPSTF recommendations; he would like to proceed with screening, so a screening PSA will be drawn.   Review of Systems  Constitutional: Negative for fever, chills, diaphoresis and activity change.  Respiratory: Positive for shortness of breath (Some shortness of breath with strenous exertion, none with usual activity.). Negative for cough and wheezing.   Cardiovascular: Negative for chest pain and leg swelling.  Gastrointestinal: Negative for nausea, vomiting, abdominal pain and blood in stool.  Genitourinary: Positive for dysuria (Occasional mild dysuria). Negative for frequency, hematuria and testicular pain.  Musculoskeletal: Negative for arthralgias.  Skin: Negative for rash.  Neurological: Negative for syncope, weakness and numbness.       Objective:   Physical Exam  Constitutional: No distress.  Cardiovascular: Normal rate, regular rhythm and normal heart sounds.  Exam reveals no gallop and no friction rub.   No murmur heard.      No leg edema.  Pulmonary/Chest: Effort normal and breath sounds normal. No respiratory distress. He has no wheezes. He has no rales.  Abdominal: Soft. Bowel sounds are  normal. He exhibits no distension. There is no hepatosplenomegaly. There is no tenderness. There is no rebound and no guarding.        Assessment & Plan:

## 2011-06-21 NOTE — Patient Instructions (Signed)
Continue current medications. 

## 2011-06-22 LAB — URINALYSIS, ROUTINE W REFLEX MICROSCOPIC
Bilirubin Urine: NEGATIVE
Glucose, UA: NEGATIVE mg/dL
Hgb urine dipstick: NEGATIVE
Ketones, ur: NEGATIVE mg/dL
Leukocytes, UA: NEGATIVE
pH: 5.5 (ref 5.0–8.0)

## 2011-06-26 ENCOUNTER — Other Ambulatory Visit: Payer: Self-pay | Admitting: *Deleted

## 2011-06-26 NOTE — Telephone Encounter (Signed)
Pt would like a call from you to discuss lab results also is there anything cheaper than tricor? You may speak w/ his spouse about this

## 2011-06-27 MED ORDER — FENOFIBRATE 48 MG PO TABS: 96.0000 mg | ORAL_TABLET | Freq: Every day | ORAL | Status: DC

## 2011-09-07 ENCOUNTER — Other Ambulatory Visit: Payer: Self-pay | Admitting: Internal Medicine

## 2012-01-09 ENCOUNTER — Encounter: Payer: Self-pay | Admitting: *Deleted

## 2012-01-09 ENCOUNTER — Other Ambulatory Visit: Payer: Medicare Other

## 2012-01-09 DIAGNOSIS — N390 Urinary tract infection, site not specified: Secondary | ICD-10-CM

## 2012-01-09 LAB — URINALYSIS, ROUTINE W REFLEX MICROSCOPIC
Bilirubin Urine: NEGATIVE
Glucose, UA: NEGATIVE mg/dL
Specific Gravity, Urine: 1.023 (ref 1.005–1.030)

## 2012-01-09 NOTE — Progress Notes (Unsigned)
Dr Meredith Pel, deb Harth brought some of her husbands urine in, states he since Saturday  has painful urination and frequency, states he "feels bad" but denies N&V, abd pain, fever. i will ask attending to place the order.

## 2012-01-09 NOTE — Progress Notes (Unsigned)
With those symptoms, he really should be seen in clinic.  I would advise urgent care if needed tonight, otherwise would have him seen in [facility] tomorrow.

## 2012-01-09 NOTE — Progress Notes (Unsigned)
Ordered UA and UC for evaluation

## 2012-01-09 NOTE — Progress Notes (Unsigned)
Spoke w/ pt's wife and pt states he has not had pain today but she will have him f/u w/ urologist and will schedule tomorrow.

## 2012-01-11 LAB — URINE CULTURE
Colony Count: NO GROWTH
Organism ID, Bacteria: NO GROWTH

## 2012-01-12 ENCOUNTER — Telehealth: Payer: Self-pay | Admitting: Internal Medicine
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
## 2012-01-15 ENCOUNTER — Other Ambulatory Visit: Payer: Self-pay | Admitting: Internal Medicine

## 2012-01-15 ENCOUNTER — Other Ambulatory Visit: Payer: Self-pay | Admitting: *Deleted

## 2012-01-16 MED ORDER — ESOMEPRAZOLE MAGNESIUM 40 MG PO CPDR: 40.0000 mg | DELAYED_RELEASE_CAPSULE | Freq: Every day | ORAL | Status: DC

## 2012-02-13 ENCOUNTER — Other Ambulatory Visit: Payer: Self-pay | Admitting: *Deleted

## 2012-02-13 MED ORDER — METFORMIN HCL ER 500 MG PO TB24: 500.0000 mg | ORAL_TABLET | Freq: Every day | ORAL | Status: DC

## 2012-02-13 NOTE — Telephone Encounter (Signed)
**   pt has refills but is asking for a change to 90 day supply.  They need a now Rx sent in.

## 2012-02-15 NOTE — Telephone Encounter (Signed)
Pt requested a 90 day supply.  I checked with Dr Josem Kaufmann and changed to # 90 with 0 refills.

## 2012-02-19 ENCOUNTER — Telehealth: Payer: Self-pay | Admitting: *Deleted

## 2012-02-19 NOTE — Telephone Encounter (Signed)
Pt's spouse desires appt w/ dr Meredith Pel for pt c/o urinary problems, after voiding feeling that "there is more but wont empty" and scrotal pain x appr 1 week. She also states that pt is using alcoholic beverages and has not been eating correctly causing his blood sugars to be elevated, though he will not tell her how high his blood sugar.  i spoke w/ dr Meredith Pel and pt will be encouraged to see someone in clinic 10/15 otherwise dr Meredith Pel will see pt thurs am at 0830. i spoke pt's wife and pt will wait until thurs, it was suggested that he come into clinic 10/15 and possibly see dr brown but pt desires to wait until thurs, doriss. Notified and will set a template for thurs, dr Meredith Pel at 0830. Pt's wife is reminded if pt becomes worse or cannot wait until thurs to let triage know for a resident visit before thurs or to go to ED after hours, she is also reminded to have pt bring meter and medicines

## 2012-02-22 ENCOUNTER — Ambulatory Visit (INDEPENDENT_AMBULATORY_CARE_PROVIDER_SITE_OTHER): Payer: Medicare Other | Admitting: Internal Medicine

## 2012-02-22 ENCOUNTER — Encounter: Payer: Self-pay | Admitting: Internal Medicine

## 2012-02-22 VITALS — BP 140/74 | HR 78 | Temp 98.0°F | Ht 68.0 in | Wt 215.8 lb

## 2012-02-22 DIAGNOSIS — E781 Pure hyperglyceridemia: Secondary | ICD-10-CM

## 2012-02-22 DIAGNOSIS — R3915 Urgency of urination: Secondary | ICD-10-CM

## 2012-02-22 DIAGNOSIS — K219 Gastro-esophageal reflux disease without esophagitis: Secondary | ICD-10-CM

## 2012-02-22 DIAGNOSIS — Z79899 Other long term (current) drug therapy: Secondary | ICD-10-CM

## 2012-02-22 DIAGNOSIS — Z23 Encounter for immunization: Secondary | ICD-10-CM

## 2012-02-22 DIAGNOSIS — E119 Type 2 diabetes mellitus without complications: Secondary | ICD-10-CM

## 2012-02-22 DIAGNOSIS — R350 Frequency of micturition: Secondary | ICD-10-CM

## 2012-02-22 LAB — CBC WITH DIFFERENTIAL/PLATELET
Basophils Absolute: 0.1 10*3/uL (ref 0.0–0.1)
Basophils Relative: 1 % (ref 0–1)
MCHC: 33.9 g/dL (ref 30.0–36.0)
Neutro Abs: 2.3 10*3/uL (ref 1.7–7.7)
Neutrophils Relative %: 30 % — ABNORMAL LOW (ref 43–77)
RDW: 13.8 % (ref 11.5–15.5)

## 2012-02-22 LAB — LDL CHOLESTEROL, DIRECT: Direct LDL: 45 mg/dL

## 2012-02-22 LAB — LIPID PANEL: Total CHOL/HDL Ratio: 16.4 Ratio

## 2012-02-22 MED ORDER — METFORMIN HCL ER 500 MG PO TB24: 1000.0000 mg | ORAL_TABLET | Freq: Every day | ORAL | Status: DC
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
## 2012-02-22 NOTE — Assessment & Plan Note (Signed)
PSA  Date Value Range Status  06/21/2011 0.70  <=4.00 ng/mL Final     Test Methodology: ECLIA PSA (Electrochemiluminescence Immunoassay)     Assessment: Patient reports increased urinary frequency with some urgency which has worsened over the past 6 months.  He has some mild discomfort with urination but no definite dysuria.  He has intermittent pain in his left femoral area, but exam shows only minimal tenderness in that area with no mass or other abnormality.  The cause of his urinary frequency is not clear; it may be related to prostatism or another genitourinary problem.  It also may due to hyperglycemia.  A urinalysis and urine culture done in September were negative.  As above, a PSA done in February was normal.  Plan: The plan today is to repeat urinalysis and urine culture, and refer to urology.  An appointment was made with Dr. Brunilda Payor at [facility] on November 13.

## 2012-02-22 NOTE — Patient Instructions (Signed)
Increase metformin XR 500 mg to a dose of 2 tablets (=1,000 mg) daily. An appointment has been made with a urologist for evaluation of your urinary frequency.

## 2012-02-22 NOTE — Assessment & Plan Note (Signed)
Lab Results  Component Value Date   HGBA1C 6.6 02/22/2012    Assessment: Although patient's hemoglobin A1c is at goal, his blood sugars have recently been running in the low 200s.  He reports urinary frequency which may be a symptom of hyperglycemia.   Plan: Diabetes treatment: Increase metformin XR to a dose of 1000 mg daily. Patient is overdue for an eye exam, and I discussed the importance of this with him; he is followed by Dr. Dione Booze and he agreed to see Dr. Dione Booze for his eye exam. Instruction/counseling given: reminded to get eye exam and reminded to bring medications to each visit.

## 2012-02-22 NOTE — Assessment & Plan Note (Signed)
Assessment: Symptoms are controlled on Nexium 40 mg daily.  Plan: Continue Nexium 40 mg daily

## 2012-02-22 NOTE — Assessment & Plan Note (Signed)
Assessment: Patient reports that he has been taking fenofibrate without problems.  Plan: Will check a lipid panel today and continue fenofibrate (TriCor) 96 mg daily.

## 2012-02-23 LAB — COMPLETE METABOLIC PANEL WITH GFR
Albumin: 3.9 g/dL (ref 3.5–5.2)
CO2: 23 mEq/L (ref 19–32)
GFR, Est African American: >89 mL/min
GFR, Est Non African American: >89 mL/min
Glucose, Bld: 190 mg/dL — ABNORMAL HIGH (ref 70–99)
Potassium: 4.4 mEq/L (ref 3.5–5.3)
Sodium: 134 mEq/L — ABNORMAL LOW (ref 135–145)
Total Protein: 6.6 g/dL (ref 6.0–8.3)

## 2012-02-23 LAB — URINALYSIS, ROUTINE W REFLEX MICROSCOPIC
Bilirubin Urine: NEGATIVE
Leukocytes, UA: NEGATIVE
Protein, ur: NEGATIVE mg/dL
Urobilinogen, UA: 0.2 mg/dL (ref 0.0–1.0)

## 2012-02-24 LAB — URINE CULTURE

## 2012-03-19 ENCOUNTER — Ambulatory Visit (INDEPENDENT_AMBULATORY_CARE_PROVIDER_SITE_OTHER): Payer: Medicare Other | Admitting: Internal Medicine

## 2012-03-19 ENCOUNTER — Telehealth: Payer: Self-pay | Admitting: *Deleted

## 2012-03-19 ENCOUNTER — Ambulatory Visit (HOSPITAL_COMMUNITY)
Admission: RE | Admit: 2012-03-19 | Discharge: 2012-03-19 | Disposition: A | Discharge status: Home / Self Care | Payer: Medicare Other | Source: Ambulatory Visit | Attending: Internal Medicine | Admitting: Internal Medicine

## 2012-03-19 VITALS — BP 148/76 | HR 71 | Temp 97.0°F | Ht 68.0 in | Wt 212.3 lb

## 2012-03-19 DIAGNOSIS — M791 Myalgia, unspecified site: Secondary | ICD-10-CM

## 2012-03-19 DIAGNOSIS — M47812 Spondylosis without myelopathy or radiculopathy, cervical region: Secondary | ICD-10-CM | POA: Insufficient documentation

## 2012-03-19 DIAGNOSIS — E119 Type 2 diabetes mellitus without complications: Secondary | ICD-10-CM

## 2012-03-19 DIAGNOSIS — M79609 Pain in unspecified limb: Secondary | ICD-10-CM

## 2012-03-19 DIAGNOSIS — R030 Elevated blood-pressure reading, without diagnosis of hypertension: Secondary | ICD-10-CM

## 2012-03-19 DIAGNOSIS — E1165 Type 2 diabetes mellitus with hyperglycemia: Secondary | ICD-10-CM

## 2012-03-19 LAB — GLUCOSE, CAPILLARY: Glucose-Capillary: 270 mg/dL — ABNORMAL HIGH (ref 70–99)

## 2012-03-19 MED ORDER — CYCLOBENZAPRINE HCL 5 MG PO TABS: 5.0000 mg | ORAL_TABLET | Freq: Three times a day (TID) | ORAL | Status: DC | PRN

## 2012-03-19 NOTE — Progress Notes (Signed)
Subjective Tyler Bridges is a 66 year old man that presents with back pain. He describes the pain as a steady ache rating 6/10 while on ibuprofen, 10/10 without ibuprofen. It started out as a slight ache 2 weeks ago, but became worse 1 week ago. It has bothered him more at night, and he has had difficulty sleeping as a result. His pain is aggravated by picking up heavy objects. Exercise, ibuprofen and pain killers help to control the pain.The pain does not radiate anywhere. He has not had something like this before. He denies a history of depression or sadness. He denies any recent weight loss, night sweats, fevers, or chills.  Objective VITALS: BP 148/76  Pulse 71  Temp 97 F (36.1 C) (Oral)  Ht 5\' 8"  (1.727 m)  Wt 212 lb 4.8 oz (96.299 kg)  BMI 32.28 kg/m2  SpO2 97%  GEN: NAD, resting comfortably HEENT: PERRL, EOMI, anicteric, nl OP, moist MM  NECK: supple, no appreciable cervical or supraclavicular LAD CV: RRR, normal S1S2, no m/r/g, no peripheral edema LUNGS: CTAB no wheezes or crackles ABD: soft, NT/ND +bs EXT: no edema/cyanosis/clubbing PSYCH: AAO x 3, normal affect NEURO: CN III-XII grossly intact, 5/5 strength b/l SKIN: no rashes or lesions MSK: no joint tenderness or effusions. Tender medial to left scapula. Some left sided paravertebral tenderness, without tenderness over vertebral column  Assessment/Plan **Back pain Tyler Bridges demonstrates paravertebral tenderness on exam. 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 He does not remember a single inciting event. The symptoms have lasted for under two weeks. He elicits no focal neurological deficits on exam.  These findings suggest a muscle sprain as the diagnosis. Given his age of 68 years and the severity of his pain, we will need further imaging to rule out other potential causes of back pain.    **HTN Tyler Bridges's blood pressure is 148/76, which is higher than usual. We believe this may 2/2 to the pain in his back. If his BP remains elevated in future visits, we may need to start an antihypertensive.

## 2012-03-19 NOTE — Telephone Encounter (Signed)
Pt's spouse reports pt injured his L shoulder yesterday and would like some pain med and muscle relaxers, appt is made w/ med student this pm

## 2012-03-19 NOTE — Patient Instructions (Addendum)
Tyler Bridges, thank you for coming in today for further assessment of your back pain.  We are going to prescribe you Cyclobenzaprine for the pain. We will also need an x-ray of your spine.  If you continue to have these problems, please be sure to call the clinic.    Cyclobenzaprine tablets What is this medicine? CYCLOBENZAPRINE (sye kloe BEN za preen) is a muscle relaxer. It is used to treat muscle pain, spasms, and stiffness. This medicine may be used for other purposes; ask your health care provider or pharmacist if you have questions. What should I tell my health care provider before I take this medicine? They need to know if you have any of these conditions: -heart disease, irregular heartbeat, or previous heart attack -liver disease -thyroid problem -an unusual or allergic reaction to cyclobenzaprine, tricyclic antidepressants, lactose, other medicines, foods, dyes, or preservatives -pregnant or trying to get pregnant -breast-feeding How should I use this medicine? Take this medicine by mouth with a glass of water. Follow the directions on the prescription label. If this medicine upsets your stomach, take it with food or milk. Take your medicine at regular intervals. Do not take it more often than directed. Talk to your pediatrician regarding the use of this medicine in children. Special care may be needed. Overdosage: If you think you have taken too much of this medicine contact a poison control center or emergency room at once. NOTE: This medicine is only for you. Do not share this medicine with others. What if I miss a dose? If you miss a dose, take it as soon as you can. If it is almost time for your next dose, take only that dose. Do not take double or extra doses. What may interact with this medicine? Do not take this medicine with any of the following medications: -cisapride -droperidol -flecainide -grepafloxacin -halofantrine -levomethadyl -MAOIs like Carbex, Eldepryl,  Marplan, Nardil, and Parnate -nilotinib -pimozide -probucol -sertindole This medicine may also interact with the following medications: -abarelix -alcohol -contrast dyes -dolasetron -guanethidine -medicines for cancer -medicines for depression, anxiety, or psychotic disturbances -medicines to treat an irregular heartbeat -medicines used for sleep or numbness during surgery or procedure -methadone -octreotide -ondansetron -palonosetron -phenothiazines like chlorpromazine, mesoridazine, prochlorperazine, thioridazine -some medicines for infection like alfuzosin, chloroquine, clarithromycin, levofloxacin, mefloquine, pentamidine, troleandomycin -tramadol -vardenafil This list may not describe all possible interactions. Give your health care provider a list of all the medicines, herbs, non-prescription drugs, or dietary supplements you use. Also tell them if you smoke, drink alcohol, or use illegal drugs. Some items may interact with your medicine. What should I watch for while using this medicine? Check with your doctor or health care professional if your condition does not improve within 1 to 3 weeks. You may get drowsy or dizzy when you first start taking the medicine or change doses. Do not drive, use machinery, or do anything that may be dangerous until you know how the medicine affects you. Stand or sit up slowly. Your mouth may get dry. Drinking water, chewing sugarless gum, or sucking on hard candy may help. What side effects may I notice from receiving this medicine? Side effects that you should report to your doctor or health care professional as soon as possible: -allergic reactions like skin rash, itching or hives, swelling of the face, lips, or tongue -chest pain -fast heartbeat -hallucinations -seizures -vomiting Side effects that usually do not require medical attention (report to your doctor or health care professional if they continue or  are  bothersome): -headache This list may not describe all possible side effects. Call your doctor for medical advice about side effects. You may report side effects to FDA at 1-800-FDA-1088. Where should I keep my medicine? Keep out of the reach of children. Store at room temperature between 15 and 30 degrees C (59 and 86 degrees F). Keep container tightly closed. Throw away any unused medicine after the expiration date. NOTE: This sheet is a summary. It may not cover all possible information. If you have questions about this medicine, talk to your doctor, pharmacist, or health care provider.  2013, [employer]. (08/05/2007 10:26:21 PM)

## 2012-03-19 NOTE — Telephone Encounter (Signed)
Agree with plan 

## 2012-03-20 NOTE — Progress Notes (Signed)
Subjective:    Patient ID: Tyler Bridges, male    DOB: [birth year], 66 y.o.   MRN: [id number]  HPI Tyler Bridges is a 66 year old male who comes in with back pain. It is located on the left paravertebral muscle region in the thoracic area of the spine. It started 3 weeks ago, and has gotten worse since the past 1 week. It is constant, dull ache in nature, about 9-10 in intensity, present at night, got better with icing and pain killers, but then came back. 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 He says he does work out but has not since the pain started. His wife, Jihan Rudy works at the clinic, and she joined [place] for the later part of the visit. She informed us that the patient has a zipline he loves to zip back and forth on, and that also can put considerable pressure on his back muscles.     He denies shortness of breath, chest pain or pressure, numbness or tingling or shooting nerve pains in the area. He has no weakness in any of his limbs and no changes in his bowel habit.  PMH  has a past medical history of Diabetes mellitus; Hypercholesteremia; Hypertriglyceridemia; GERD (gastroesophageal reflux disease); ED (erectile dysfunction); BPH (benign prostatic hypertrophy); Osteoarthritis; Gastric ulcer; SBO (small bowel obstruction); Chronic abdominal pain; Pterygium; Muscle spasm of back; Cramps, muscle, general; UTI (lower urinary tract infection); Atypical chest pain; and Peyronie's syndrome.  Review of Systems  Constitutional: Positive for activity change. Negative for chills and fatigue.  HENT: Negative.   Eyes: Negative.   Respiratory: Negative.  Negative for chest tightness and shortness of breath.   Cardiovascular: Negative.  Negative for palpitations.  Gastrointestinal: Negative.   Musculoskeletal: Negative for myalgias, joint swelling, arthralgias and gait problem.  Skin: Negative.   Neurological: Negative.   Hematological: Negative.   Psychiatric/Behavioral: Negative.        Objective:   Physical Exam  Constitutional: He is oriented to person, place, and time. He appears well-developed and well-nourished.  HENT:  Head: Normocephalic and atraumatic.  Eyes: Conjunctivae normal and EOM are normal. Pupils are equal, round, and reactive to light.  Neck: Normal range of motion. Neck supple.  Cardiovascular: Normal rate, regular rhythm, normal heart sounds and intact distal pulses.   Pulmonary/Chest: Effort normal and breath sounds normal.  Abdominal: Soft. Bowel sounds are normal.  Musculoskeletal: Normal range of motion.       Thoracic back: He exhibits tenderness and pain. He exhibits no swelling, no edema, no deformity, no spasm and normal pulse.       Back:  Neurological: He is alert and oriented to person, place, and time.  Skin: Skin is warm.  Psychiatric: He has a normal mood and affect. His behavior is normal.  The movement of the shoulder joints is normal.    Assessment & Plan:   Back Pain - This pain looks muscular in origin. The area is tender when I press on it which supports muscular pain. My differential would include any vertebral pathology, pain from gastritis or PUD, cardiac causes. I would think cardiac causes are unlikely given the nature of the pain (constantly present since 3 weeks). It could be pain from PUD, however, the patient is on Nexium and does not think his heartburn has changed in nature, neither does he think he has black stools/changed bowel habit. Also, it gets better with painkillers like flexeril.  For now, I will prescribe  him with Flexeril. I will do an Xray of thoracic and cervical spines to rule out any acute vertebral pathology. I have instructed him to rest, apply ice on the area twice a day.  If this does not get better he should give [place] a call back.  High blood pressure and diabetes - His blood pressure is high on this visit, which I would possibly attribute to his pain right now. I would focus on back pain this  visit and leave the management of his back pain, and he can follow up with Dr. Meredith Pel who is his PCP for his chronic problems.  Thank you.

## 2012-03-22 ENCOUNTER — Ambulatory Visit (INDEPENDENT_AMBULATORY_CARE_PROVIDER_SITE_OTHER): Payer: Medicare Other | Admitting: Surgery

## 2012-03-22 ENCOUNTER — Encounter (INDEPENDENT_AMBULATORY_CARE_PROVIDER_SITE_OTHER): Payer: Self-pay | Admitting: Surgery

## 2012-03-22 VITALS — BP 142/82 | HR 78 | Temp 97.7°F | Resp 20 | Ht 68.0 in | Wt 211.2 lb

## 2012-03-22 DIAGNOSIS — K4090 Unilateral inguinal hernia, without obstruction or gangrene, not specified as recurrent: Secondary | ICD-10-CM

## 2012-03-22 NOTE — Progress Notes (Signed)
NAME: Tyler Bridges DOB: 06/07/1945 MRN: [id number]                                                                                      DATE: 03/22/2012  PCP: JOINES,JERRY DALE, MD Referring Provider: Grapey, David S, MD  IMPRESSION:  Reducible, symptomatic left inguinal hernia  PLAN:   Repair. I have discussed with the patient the fact that he has an inguinal hernia and reviewed the pathophysiology of that diagnosis. I have given him educational materials about this. I discussed options for treatment including observation or repair and have discussed both laparoscopic and open inguinal hernia repairs as potential techniques. We have discussed the use of mesh. We have discussed risks of surgery including bleeding, infection, recurrence, postoperative pain and possible chronic groin pain, testicular injury, and potential issues with voiding. I believe the patient's questions have been answered and that he has a good understanding of the issues  He is apparently going to need a circumcision so we will co-ordinate with urology                  CC:  Chief Complaint  Patient presents with  . Inguinal Hernia    Left    HPI:  Tyler Bridges is a 66 y.o.  male who presents for evaluation of a left inguinal hernia. He's been having some discomfort in that area.He is seeing a urologist for some voiding symptoms and the urologist noted a hernia on exam and asked us to see him.when he does strenuous activities he does have some discomfort in that left inguinal area.   PMH:  has a past medical history of Diabetes mellitus; Hypercholesteremia; Hypertriglyceridemia; GERD (gastroesophageal reflux disease); ED (erectile dysfunction); BPH (benign prostatic hypertrophy); Osteoarthritis; Gastric ulcer; SBO (small bowel obstruction); Chronic abdominal pain; Pterygium; Muscle spasm of back; Cramps, muscle, general; UTI (lower urinary tract infection); Atypical chest pain; Peyronie's syndrome; and Left  inguinal hernia (03/22/2012).  PSH:   has past surgical history that includes Abdominal surgery (1994 (approximate)); Shoulder arthroscopy (03/28/2001); Knee arthroscopy (12/13/2006); Ventral hernia repair (2000); Appendectomy; Pterygium excision (2004); and Abdominal adhesion surgery (1995).  ALLERGIES:   Allergies  Allergen Reactions  . Propoxyphene-Acetaminophen     REACTION: Itching    MEDICATIONS: Current outpatient prescriptions:aspirin 81 MG EC tablet, Take 81 mg by mouth daily.  , Disp: , Rfl: ;  cyclobenzaprine (FLEXERIL) 5 MG tablet, Take 1 tablet (5 mg total) by mouth every 8 (eight) hours as needed for muscle spasms., Disp: 30 tablet, Rfl: 0;  esomeprazole (NEXIUM) 40 MG capsule, Take 1 capsule (40 mg total) by mouth daily., Disp: 30 capsule, Rfl: 5 fenofibrate (TRICOR) 48 MG tablet, Take 2 tablets (96 mg total) by mouth daily., Disp: 180 tablet, Rfl: 3;  metFORMIN (GLUCOPHAGE-XR) 500 MG 24 hr tablet, Take 2 tablets (1,000 mg total) by mouth daily., Disp: 62 tablet, Rfl: 6;  silodosin (RAPAFLO) 8 MG CAPS capsule, Take 8 mg by mouth daily with breakfast., Disp: , Rfl: ;  ONE TOUCH ULTRA TEST test strip, , Disp: , Rfl:   ROS: He has filled out our 12 point review of systems and it   is negative . EXAM:   VITAL SIGNS: BP 142/82  Pulse 78  Temp 97.7 F (36.5 C) (Oral)  Resp 20  Ht 5' 8" (1.727 m)  Wt 211 lb 3.2 oz (95.8 kg)  BMI 32.11 kg/m2  GENERAL:  The patient is alert, oriented, and generally healthy-appearing, NAD. Mood and affect are normal.  HEENT:  The head is normocephalic, the eyes nonicteric, the pupils were round regular and equal. EOMs are normal. Pharynx normal. Dentition good.  NECK:  The neck is supple and there are no masses or thyromegaly.  LUNGS:  Normal respirations and clear to auscultation.  HEART:  Regular rhythm, with no murmurs rubs or gallops. Pulses are intact carotid dorsalis pedis and posterior tibial. No significant varicosities are  noted.  ABDOMEN:  Soft, flat, and nontender. No masses or organomegaly is noted. No hernias are noted. Bowel sounds are normal.  GU:  Normal testis. LIH, reducible, likely direct, no RIH  EXTREMITIES:  Good range of motion, no edema.   DATA REVIEWED:  Notes from Dr Grapey    Nalah Macioce J 03/22/2012  CC: Grapey, David S, MD, JOINES,JERRY DALE, MD        

## 2012-03-22 NOTE — Patient Instructions (Signed)
We will co-ordinate a surgery time with the urologist so we can repair your left inguinal hernia

## 2012-04-02 ENCOUNTER — Other Ambulatory Visit: Payer: Self-pay | Admitting: *Deleted

## 2012-04-02 DIAGNOSIS — M791 Myalgia, unspecified site: Secondary | ICD-10-CM

## 2012-04-02 MED ORDER — CYCLOBENZAPRINE HCL 5 MG PO TABS: 5.0000 mg | ORAL_TABLET | Freq: Three times a day (TID) | ORAL | Status: DC | PRN

## 2012-04-02 NOTE — Telephone Encounter (Signed)
I refilled for #30; if patient has ongoing symptoms, he will need a follow-up appointment.

## 2012-04-02 NOTE — Telephone Encounter (Signed)
Called to pharm and informed pt's wife

## 2012-04-09 ENCOUNTER — Other Ambulatory Visit: Payer: Self-pay | Admitting: Urology

## 2012-04-12 ENCOUNTER — Other Ambulatory Visit (INDEPENDENT_AMBULATORY_CARE_PROVIDER_SITE_OTHER): Payer: Self-pay | Admitting: Surgery

## 2012-04-15 ENCOUNTER — Encounter (HOSPITAL_BASED_OUTPATIENT_CLINIC_OR_DEPARTMENT_OTHER): Payer: Self-pay | Admitting: *Deleted

## 2012-04-15 NOTE — Progress Notes (Signed)
NPO AFTER MN. ARRIVES AT 075. NEEDS ISTAT AND EKG. WILL TAKE AM MEDS WITH EXCEPTION METFORMIN AM OF SURG W/ SIP OF WATER. WILL DO HIBICLENS HS AND AM OF SURG .

## 2012-04-17 ENCOUNTER — Encounter (HOSPITAL_BASED_OUTPATIENT_CLINIC_OR_DEPARTMENT_OTHER): Payer: Self-pay | Admitting: Anesthesiology

## 2012-04-17 ENCOUNTER — Encounter (HOSPITAL_BASED_OUTPATIENT_CLINIC_OR_DEPARTMENT_OTHER)
Admission: RE | Disposition: A | Discharge status: Home / Self Care | Payer: Self-pay | Source: Ambulatory Visit | Attending: Surgery

## 2012-04-17 ENCOUNTER — Ambulatory Visit (HOSPITAL_BASED_OUTPATIENT_CLINIC_OR_DEPARTMENT_OTHER)
Admission: RE | Admit: 2012-04-17 | Discharge: 2012-04-17 | Disposition: A | Discharge status: Home / Self Care | Payer: Medicare Other | Source: Ambulatory Visit | Attending: Surgery | Admitting: Surgery

## 2012-04-17 ENCOUNTER — Encounter (HOSPITAL_BASED_OUTPATIENT_CLINIC_OR_DEPARTMENT_OTHER): Payer: Self-pay | Admitting: *Deleted

## 2012-04-17 ENCOUNTER — Ambulatory Visit (HOSPITAL_BASED_OUTPATIENT_CLINIC_OR_DEPARTMENT_OTHER): Payer: Medicare Other | Admitting: Anesthesiology

## 2012-04-17 DIAGNOSIS — E119 Type 2 diabetes mellitus without complications: Secondary | ICD-10-CM | POA: Insufficient documentation

## 2012-04-17 DIAGNOSIS — E7800 Pure hypercholesterolemia, unspecified: Secondary | ICD-10-CM | POA: Insufficient documentation

## 2012-04-17 DIAGNOSIS — K219 Gastro-esophageal reflux disease without esophagitis: Secondary | ICD-10-CM | POA: Insufficient documentation

## 2012-04-17 DIAGNOSIS — K4090 Unilateral inguinal hernia, without obstruction or gangrene, not specified as recurrent: Secondary | ICD-10-CM

## 2012-04-17 DIAGNOSIS — Z79899 Other long term (current) drug therapy: Secondary | ICD-10-CM | POA: Insufficient documentation

## 2012-04-17 DIAGNOSIS — E781 Pure hyperglyceridemia: Secondary | ICD-10-CM | POA: Insufficient documentation

## 2012-04-17 HISTORY — PX: CIRCUMCISION: SHX1350

## 2012-04-17 HISTORY — PX: INGUINAL HERNIA REPAIR: SHX194

## 2012-04-17 LAB — POCT I-STAT, CHEM 8
Calcium, Ion: 1.22 mmol/L (ref 1.13–1.30)
Glucose, Bld: 183 mg/dL — ABNORMAL HIGH (ref 70–99)
HCT: 41 % (ref 39.0–52.0)
Hemoglobin: 13.9 g/dL (ref 13.0–17.0)
Potassium: 4.3 mEq/L (ref 3.5–5.1)
TCO2: 25 mmol/L (ref 0–100)

## 2012-04-17 SURGERY — REPAIR, HERNIA, INGUINAL, ADULT
Anesthesia: General | Site: Penis | Wound class: Clean

## 2012-04-17 MED ORDER — OXYCODONE-ACETAMINOPHEN 5-325 MG PO TABS: 1.0000 | ORAL_TABLET | ORAL | Status: DC | PRN

## 2012-04-17 MED ORDER — LIDOCAINE HCL (CARDIAC) 20 MG/ML IV SOLN
INTRAVENOUS | Status: DC | PRN
  Administered 2012-04-17: 80 mg via INTRAVENOUS

## 2012-04-17 MED ORDER — ROCURONIUM BROMIDE 100 MG/10ML IV SOLN
INTRAVENOUS | Status: DC | PRN
  Administered 2012-04-17 (×2): 20 mg via INTRAVENOUS
  Administered 2012-04-17: 10 mg via INTRAVENOUS

## 2012-04-17 MED ORDER — BUPIVACAINE HCL (PF) 0.25 % IJ SOLN
INTRAMUSCULAR | Status: DC | PRN
  Administered 2012-04-17: 7 mL

## 2012-04-17 MED ORDER — ONDANSETRON HCL 4 MG/2ML IJ SOLN
INTRAMUSCULAR | Status: DC | PRN
  Administered 2012-04-17: 4 mg via INTRAVENOUS

## 2012-04-17 MED ORDER — LACTATED RINGERS IV SOLN
INTRAVENOUS | Status: DC
  Administered 2012-04-17 (×2): via INTRAVENOUS
  Filled 2012-04-17: qty 1000

## 2012-04-17 MED ORDER — SODIUM CHLORIDE 0.9 % IR SOLN
Status: DC | PRN
  Administered 2012-04-17: 500 mL

## 2012-04-17 MED ORDER — CEFAZOLIN SODIUM-DEXTROSE 2-3 GM-% IV SOLR
2.0000 g | INTRAVENOUS | Status: AC
  Administered 2012-04-17: 2 g via INTRAVENOUS
  Filled 2012-04-17: qty 50

## 2012-04-17 MED ORDER — FENTANYL CITRATE 0.05 MG/ML IJ SOLN
INTRAMUSCULAR | Status: DC | PRN
  Administered 2012-04-17 (×4): 50 ug via INTRAVENOUS

## 2012-04-17 MED ORDER — NEOSTIGMINE METHYLSULFATE 1 MG/ML IJ SOLN
INTRAMUSCULAR | Status: DC | PRN
  Administered 2012-04-17: 3 mg via INTRAVENOUS

## 2012-04-17 MED ORDER — LACTATED RINGERS IV SOLN
INTRAVENOUS | Status: DC
  Filled 2012-04-17: qty 1000

## 2012-04-17 MED ORDER — CHLORHEXIDINE GLUCONATE 4 % EX LIQD
1.0000 "application " | Freq: Once | CUTANEOUS | Status: DC
  Filled 2012-04-17: qty 15

## 2012-04-17 MED ORDER — SUCCINYLCHOLINE CHLORIDE 20 MG/ML IJ SOLN
INTRAMUSCULAR | Status: DC | PRN
  Administered 2012-04-17: 100 mg via INTRAVENOUS

## 2012-04-17 MED ORDER — MIDAZOLAM HCL 5 MG/5ML IJ SOLN
INTRAMUSCULAR | Status: DC | PRN
  Administered 2012-04-17: 2 mg via INTRAVENOUS

## 2012-04-17 MED ORDER — FENTANYL CITRATE 0.05 MG/ML IJ SOLN
25.0000 ug | INTRAMUSCULAR | Status: DC | PRN
  Filled 2012-04-17: qty 1

## 2012-04-17 MED ORDER — BUPIVACAINE HCL (PF) 0.25 % IJ SOLN
INTRAMUSCULAR | Status: DC | PRN
  Administered 2012-04-17: 30 mL

## 2012-04-17 MED ORDER — PROPOFOL 10 MG/ML IV BOLUS
INTRAVENOUS | Status: DC | PRN
  Administered 2012-04-17: 200 mg via INTRAVENOUS

## 2012-04-17 MED ORDER — DEXAMETHASONE SODIUM PHOSPHATE 4 MG/ML IJ SOLN
INTRAMUSCULAR | Status: DC | PRN
  Administered 2012-04-17: 8 mg via INTRAVENOUS

## 2012-04-17 MED ORDER — GLYCOPYRROLATE 0.2 MG/ML IJ SOLN
INTRAMUSCULAR | Status: DC | PRN
  Administered 2012-04-17: 0.4 mg via INTRAVENOUS

## 2012-04-17 SURGICAL SUPPLY — 56 items
BANDAGE COBAN STERILE 2 (GAUZE/BANDAGES/DRESSINGS) ×3 IMPLANT
BANDAGE CONFORM 2  STR LF (GAUZE/BANDAGES/DRESSINGS) ×3 IMPLANT
BLADE HEX COATED 2.75 (ELECTRODE) ×3 IMPLANT
BLADE SURG 10 STRL SS (BLADE) ×3 IMPLANT
BLADE SURG 15 STRL LF DISP TIS (BLADE) ×2 IMPLANT
BLADE SURG 15 STRL SS (BLADE) ×1
BLADE SURG ROTATE 9660 (MISCELLANEOUS) IMPLANT
CLOTH BEACON ORANGE TIMEOUT ST (SAFETY) ×6 IMPLANT
COVER MAYO STAND STRL (DRAPES) ×3 IMPLANT
COVER TABLE BACK 60X90 (DRAPES) ×3 IMPLANT
DECANTER SPIKE VIAL GLASS SM (MISCELLANEOUS) IMPLANT
DERMABOND ADVANCED (GAUZE/BANDAGES/DRESSINGS) ×2
DERMABOND ADVANCED .7 DNX12 (GAUZE/BANDAGES/DRESSINGS) ×4 IMPLANT
DISSECTOR ROUND CHERRY 3/8 STR (MISCELLANEOUS) ×3 IMPLANT
DRAIN PENROSE 18X1/2 LTX STRL (DRAIN) ×3 IMPLANT
DRAPE LAPAROTOMY T 102X78X121 (DRAPES) ×3 IMPLANT
DRAPE LAPAROTOMY TRNSV 102X78 (DRAPE) ×3 IMPLANT
DRAPE PED LAPAROTOMY (DRAPES) ×3 IMPLANT
ELECT NEEDLE TIP 2.8 STRL (NEEDLE) ×3 IMPLANT
ELECT REM PT RETURN 9FT ADLT (ELECTROSURGICAL) ×3
ELECTRODE REM PT RTRN 9FT ADLT (ELECTROSURGICAL) ×2 IMPLANT
GAUZE SPONGE 4X4 12PLY STRL LF (GAUZE/BANDAGES/DRESSINGS) IMPLANT
GAUZE SPONGE 4X4 16PLY XRAY LF (GAUZE/BANDAGES/DRESSINGS) ×3 IMPLANT
GAUZE XEROFORM 1X8 LF (GAUZE/BANDAGES/DRESSINGS) ×3 IMPLANT
GLOVE BIO SURGEON STRL SZ 6.5 (GLOVE) ×3 IMPLANT
GLOVE BIO SURGEON STRL SZ7.5 (GLOVE) ×3 IMPLANT
GLOVE ECLIPSE 6.0 STRL STRAW (GLOVE) ×3 IMPLANT
GLOVE EUDERMIC 7 POWDERFREE (GLOVE) ×3 IMPLANT
GOWN STRL REIN XL XLG (GOWN DISPOSABLE) ×3 IMPLANT
GOWN W/2 COTTON TOWELS 2 STD (GOWNS) ×6 IMPLANT
MESH HERNIA 3X6 (Mesh General) ×3 IMPLANT
NEEDLE HYPO 22GX1.5 SAFETY (NEEDLE) ×3 IMPLANT
NEEDLE HYPO 25X1 1.5 SAFETY (NEEDLE) ×3 IMPLANT
NS IRRIG 500ML POUR BTL (IV SOLUTION) ×3 IMPLANT
PACK BASIN DAY SURGERY FS (CUSTOM PROCEDURE TRAY) ×3 IMPLANT
PENCIL BUTTON HOLSTER BLD 10FT (ELECTRODE) ×3 IMPLANT
PLUG AND PATCH SMALL (MISCELLANEOUS) IMPLANT
PLUG ATRIUM AND PATCH X LRG (MISCELLANEOUS) IMPLANT
SPONGE GAUZE 4X4 12PLY (GAUZE/BANDAGES/DRESSINGS) ×3 IMPLANT
SPONGE LAP 4X18 X RAY DECT (DISPOSABLE) ×3 IMPLANT
SUT CHROMIC 4 0 P 3 18 (SUTURE) ×6 IMPLANT
SUT CHROMIC 4 0 PS 2 18 (SUTURE) ×6 IMPLANT
SUT MNCRL AB 4-0 PS2 18 (SUTURE) ×3 IMPLANT
SUT PROLENE 2 0 CT2 30 (SUTURE) ×6 IMPLANT
SUT SILK 2 0 SH (SUTURE) IMPLANT
SUT SILK 3 0 SH 30 (SUTURE) IMPLANT
SUT VIC AB 3-0 CT1 27 (SUTURE) ×2
SUT VIC AB 3-0 CT1 27XBRD (SUTURE) ×4 IMPLANT
SUT VIC AB 3-0 SH 27 (SUTURE) ×2
SUT VIC AB 3-0 SH 27X BRD (SUTURE) ×4 IMPLANT
SUT VIC AB 4-0 BRD 54 (SUTURE) IMPLANT
SYR CONTROL 10ML LL (SYRINGE) ×3 IMPLANT
TAPE CLOTH SURG 6X10 WHT LF (GAUZE/BANDAGES/DRESSINGS) ×3 IMPLANT
TOWEL OR 17X26 10 PK STRL BLUE (TOWEL DISPOSABLE) ×9 IMPLANT
TRAY DSU PREP LF (CUSTOM PROCEDURE TRAY) ×3 IMPLANT
WATER STERILE IRR 500ML POUR (IV SOLUTION) IMPLANT

## 2012-04-17 NOTE — Anesthesia Postprocedure Evaluation (Signed)
  Anesthesia Post-op Note  Patient: Tyler Bridges  Procedure(s) Performed: Procedure(s) (LRB): HERNIA REPAIR INGUINAL ADULT (Left) CIRCUMCISION ADULT (N/A)  Patient Location: PACU  Anesthesia Type: General  Level of Consciousness: awake and alert   Airway and Oxygen Therapy: Patient Spontanous Breathing  Post-op Pain: mild  Post-op Assessment: Post-op Vital signs reviewed, Patient's Cardiovascular Status Stable, Respiratory Function Stable, Patent Airway and No signs of Nausea or vomiting  Last Vitals:  Filed Vitals:   04/17/12 1105  BP: 148/75  Pulse: 80  Temp: 36.6 C  Resp: 24    Post-op Vital Signs: stable   Complications: No apparent anesthesia complications

## 2012-04-17 NOTE — Transfer of Care (Signed)
Immediate Anesthesia Transfer of Care Note  Patient: Tyler Bridges  Procedure(s) Performed: Procedure(s) (LRB): HERNIA REPAIR INGUINAL ADULT (Left) CIRCUMCISION ADULT (N/A)  Patient Location: PACU  Anesthesia Type: General  Level of Consciousness: awake, oriented, sedated and patient cooperative  Airway & Oxygen Therapy: Patient Spontanous Breathing and Patient connected to face mask oxygen  Post-op Assessment: Report given to PACU RN and Post -op Vital signs reviewed and stable  Post vital signs: Reviewed and stable  Complications: No apparent anesthesia complications

## 2012-04-17 NOTE — Anesthesia Procedure Notes (Signed)
Procedure Name: Intubation Date/Time: 04/17/2012 9:08 AM Performed by: Renella Cunas D Pre-anesthesia Checklist: Patient identified, Emergency Drugs available, Suction available and Patient being monitored Patient Re-evaluated:Patient Re-evaluated prior to inductionOxygen Delivery Method: Circle System Utilized Preoxygenation: Pre-oxygenation with 100% oxygen Intubation Type: IV induction Ventilation: Mask ventilation without difficulty Laryngoscope Size: Mac and 4 Grade View: Grade I Tube type: Oral Tube size: 8.0 mm Number of attempts: 1 Airway Equipment and Method: stylet,  oral airway,  Stylet and LTA kit utilized Placement Confirmation: ETT inserted through vocal cords under direct vision,  positive ETCO2 and breath sounds checked- equal and bilateral Secured at: 22 cm Tube secured with: Tape Dental Injury: Teeth and Oropharynx as per pre-operative assessment

## 2012-04-17 NOTE — Brief Op Note (Signed)
04/17/2012  10:59 AM  PATIENT:  Tyler Bridges  66 y.o. male  PRE-OPERATIVE DIAGNOSIS:  left inguinal hernia and recurrent balanitis  POST-OPERATIVE DIAGNOSIS:  left inguinal hernia and recurrent balanitis  PROCEDURE:  Procedure(s) (LRB) with comments: HERNIA REPAIR INGUINAL ADULT (Left) - repair left inguinal hernia CIRCUMCISION ADULT (N/A) - with penile block  SURGEON:  Surgeon(s) and Role: Panel 1:    * Christian Leta Jungling, MD - Primary  Panel 2:    * Sebastian Ache, MD - Primary  PHYSICIAN ASSISTANT:   ASSISTANTS: none   ANESTHESIA:   local and general  EBL:  Total I/O In: 1000 [I.V.:1000] Out: -   BLOOD ADMINISTERED:none  DRAINS: none   LOCAL MEDICATIONS USED:  MARCAINE     SPECIMEN:  Source of Specimen:  foreskin  DISPOSITION OF SPECIMEN:  PATHOLOGY  COUNTS:  YES  TOURNIQUET:  * No tourniquets in log *  DICTATION: .Other Dictation: Dictation Number [id number]  PLAN OF CARE: Discharge to home after PACU  PATIENT DISPOSITION:  PACU - hemodynamically stable.   Delay start of Pharmacological VTE agent (>24hrs) due to surgical blood loss or risk of bleeding: no

## 2012-04-17 NOTE — Interval H&P Note (Signed)
History and Physical Interval Note:  04/17/2012 8:40 AM  Tyler Bridges  has presented today for surgery, with the diagnosis of left inguinal hernia  The various methods of treatment have been discussed with the patient and family. After consideration of risks, benefits and other options for treatment, the patient has consented to  Procedure(s) (LRB) with comments: HERNIA REPAIR INGUINAL ADULT (Left) - repair left inguinal hernia CIRCUMCISION ADULT (N/A) as a surgical intervention .  The patient's history has been reviewed, patient examined, no change in status, stable for surgery.  I have reviewed the patient's chart and labs.  Questions were answered to the patient's satisfaction.   I have marked the left inguinal area as the surgical site  Nanda Bittick J
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
## 2012-04-17 NOTE — Anesthesia Preprocedure Evaluation (Addendum)
Anesthesia Evaluation  Patient identified by MRN, date of birth, ID band Patient awake    Reviewed: Allergy & Precautions, H&P , NPO status , Patient's Chart, lab work & pertinent test results  Airway Mallampati: III TM Distance: >3 FB Neck ROM: full    Dental No notable dental hx. (+) Teeth Intact and Dental Advisory Given   Pulmonary neg pulmonary ROS,  breath sounds clear to auscultation  Pulmonary exam normal       Cardiovascular Exercise Tolerance: Good Rhythm:regular Rate:Normal     Neuro/Psych negative neurological ROS  negative psych ROS   GI/Hepatic negative GI ROS, Neg liver ROS, hiatal hernia, GERD-  Medicated and Poorly Controlled,  Endo/Other  diabetes, Well Controlled, Type 2, Oral Hypoglycemic Agents  Renal/GU negative Renal ROS  negative genitourinary   Musculoskeletal   Abdominal   Peds  Hematology negative hematology ROS (+)   Anesthesia Other Findings   Reproductive/Obstetrics negative OB ROS                          Anesthesia Physical Anesthesia Plan  ASA: III  Anesthesia Plan: General   Post-op Pain Management:    Induction: Intravenous  Airway Management Planned: Oral ETT  Additional Equipment:   Intra-op Plan:   Post-operative Plan: Extubation in OR  Informed Consent: I have reviewed the patients History and Physical, chart, labs and discussed the procedure including the risks, benefits and alternatives for the proposed anesthesia with the patient or authorized representative who has indicated his/her understanding and acceptance.   Dental Advisory Given  Plan Discussed with: CRNA and Surgeon  Anesthesia Plan Comments:        Anesthesia Quick Evaluation

## 2012-04-17 NOTE — H&P (View-Only) (Signed)
NAMEBLAIR LUNDEEN DOB: 11-Jul-1945 MRN: [id number]                                                                                      DATE: 03/22/2012  PCP: Farley Ly, MD Referring Provider: Valetta Fuller, MD  IMPRESSION:  Reducible, symptomatic left inguinal hernia  PLAN:   Repair. I have discussed with the patient the fact that he has an inguinal hernia and reviewed the pathophysiology of that diagnosis. I have given him educational materials about this. I discussed options for treatment including observation or repair and have discussed both laparoscopic and open inguinal hernia repairs as potential techniques. We have discussed the use of mesh. We have discussed risks of surgery including bleeding, infection, recurrence, postoperative pain and possible chronic groin pain, testicular injury, and potential issues with voiding. I believe the patient's questions have been answered and that he has a good understanding of the issues  He is apparently going to need a circumcision so we will co-ordinate with urology                  CC:  Chief Complaint  Patient presents with  . Inguinal Hernia    Left    HPI:  WARD BOISSONNEAULT is a 66 y.o.  male who presents for evaluation of a left inguinal hernia. He's been having some discomfort in that area.He is seeing a urologist for some voiding symptoms and the urologist noted a hernia on exam and asked [place] to see him.when he does strenuous activities he does have some discomfort in that left inguinal area.   PMH:  has a past medical history of Diabetes mellitus; Hypercholesteremia; Hypertriglyceridemia; GERD (gastroesophageal reflux disease); ED (erectile dysfunction); BPH (benign prostatic hypertrophy); Osteoarthritis; Gastric ulcer; SBO (small bowel obstruction); Chronic abdominal pain; Pterygium; Muscle spasm of back; Cramps, muscle, general; UTI (lower urinary tract infection); Atypical chest pain; Peyronie's syndrome; and Left  inguinal hernia (03/22/2012).  PSH:   has past surgical history that includes Abdominal surgery (1994 (approximate)); Shoulder arthroscopy (03/28/2001); Knee arthroscopy (12/13/2006); Ventral hernia repair (2000); Appendectomy; Pterygium excision (2004); and Abdominal adhesion surgery (1995).  ALLERGIES:   Allergies  Allergen Reactions  . Propoxyphene-Acetaminophen     REACTION: Itching    MEDICATIONS: Current outpatient prescriptions:aspirin 81 MG EC tablet, Take 81 mg by mouth daily.  , Disp: , Rfl: ;  cyclobenzaprine (FLEXERIL) 5 MG tablet, Take 1 tablet (5 mg total) by mouth every 8 (eight) hours as needed for muscle spasms., Disp: 30 tablet, Rfl: 0;  esomeprazole (NEXIUM) 40 MG capsule, Take 1 capsule (40 mg total) by mouth daily., Disp: 30 capsule, Rfl: 5 fenofibrate (TRICOR) 48 MG tablet, Take 2 tablets (96 mg total) by mouth daily., Disp: 180 tablet, Rfl: 3;  metFORMIN (GLUCOPHAGE-XR) 500 MG 24 hr tablet, Take 2 tablets (1,000 mg total) by mouth daily., Disp: 62 tablet, Rfl: 6;  silodosin (RAPAFLO) 8 MG CAPS capsule, Take 8 mg by mouth daily with breakfast., Disp: , Rfl: ;  ONE TOUCH ULTRA TEST test strip, , Disp: , Rfl:   ROS: He has filled out our 12 point review of systems and it  is negative . EXAM:   VITAL SIGNS: BP 142/82  Pulse 78  Temp 97.7 F (36.5 C) (Oral)  Resp 20  Ht 5\' 8"  (1.727 m)  Wt 211 lb 3.2 oz (95.8 kg)  BMI 32.11 kg/m2  GENERAL:  The patient is alert, oriented, and generally healthy-appearing, NAD. Mood and affect are normal.  HEENT:  The head is normocephalic, the eyes nonicteric, the pupils were round regular and equal. EOMs are normal. Pharynx normal. Dentition good.  NECK:  The neck is supple and there are no masses or thyromegaly.  LUNGS:  Normal respirations and clear to auscultation.  HEART:  Regular rhythm, with no murmurs rubs or gallops. Pulses are intact carotid dorsalis pedis and posterior tibial. No significant varicosities are  noted.  ABDOMEN:  Soft, flat, and nontender. No masses or organomegaly is noted. No hernias are noted. Bowel sounds are normal.  GU:  Normal testis. LIH, reducible, likely direct, no RIH  EXTREMITIES:  Good range of motion, no edema.   DATA REVIEWED:  Notes from Dr Alonna Buckler 03/22/2012  CC: Valetta Fuller, MD, Farley Ly, MD

## 2012-04-17 NOTE — Op Note (Signed)
Tyler Bridges 04-May-1946 [id number] 03/29/2012  Preoperative diagnosis: Left inguinal hernia   Postoperative diagnosis: Same - direct and indirect  Procedure: Repair LIH with mesh  Surgeon: Currie Paris, MD, FACS  Anesthesia:General  Clinical History and Indications: Patient has symptomatic left inguinal hernia  Description of Procedure:The patient was seen in the holding area and the plans for the procedure as noted above confirmed with the patient. We reviewed again the risks and complications and the patient had no further questions. I then marked the left  as the operative side. This was confirmed with the patient. He wishes to proceed.  The patient was taken to the operating room and after satisfactory GET anesthesia was obtained the left  inguinal area was prepped and draped as a sterile field. A time out was done.  I used 0.25% marcaine as local anesthesia and to help with postoperative pain management. The area of the incision was infiltrated first as well as a field block going medially and inferiorly. I also injected some below the external oblique aponeurosis at the level of the anterior superior iliac spine.  An oblique incision was made and deepened to the external oblique aponeurosis. Bleeders were either cauterized or tied with 3-0 Vicryl. The external oblique aponeurosis was opened in the line of its fibers and elevated off of the underlying tissue. The ilioinguinal nerve was noted and protected.  The spermatic cord was then dissected up off of the inguinal floor and surrounded with a drain. There was a direct defect present. There was also a very dilated internal ring with a large amount of pre-peritoneal fat protruding along the antero-medial aspect of the cord. I was able to strip this off the cord, leaving the vas and vessels intact, and reduce it. I placed two sutures of 2-0 Prolene to close the deep ring, placing them lateral to the cord.  I then took some Bard  mesh and cut it to shape. It was anchored at the pubic tubercle and a running 2-0 Prolene used to suture it to the edge of the inferior shelving edge of the external oblique. It was split laterally to go around the cord and then laid gently over the internal oblique medially. Several more sutures of 2-0 Prolene were used to anchor the mesh to the internal oblique. The tails of the mesh were crossed lateral to the cord structures and deep ring and tacked together.  This appeared to produce a nice coverage and repair with no tension. There was adequate space for the cord structures to exit through the mesh and deep ring.  I checked to make sure everything was dry. Additional local had been infiltrated as I was working to be sure we had complete anesthesia of the entire operative field.  The incision was then closed with a running 3-0 Vicryl on the external oblique, closing it over the repair. Scarpa's fascia was closed with a running 3-0 Vicryl and the skin with a running 4-0 Monocryl subcuticular and Dermabond on the skin.  The patient tolerated the procedure well. There were no operative complications. There was minimal blood loss. All counts were correct.  At this point the urologist came in to perform the planned circumcision  Currie Paris, MD, FACS 04/17/2012 10:16 AM

## 2012-04-18 ENCOUNTER — Encounter (HOSPITAL_BASED_OUTPATIENT_CLINIC_OR_DEPARTMENT_OTHER): Payer: Self-pay | Admitting: Surgery

## 2012-04-18 NOTE — Op Note (Signed)
NAME:  Tyler Bridges, Tyler Bridges                ACCOUNT NO.:  [id number]  MEDICAL RECORD NO.:  [id number]  LOCATION:                               FACILITY:  [facility]  PHYSICIAN:  Aneyah Lortz, MD     DATE OF BIRTH:  07/28/1945  DATE OF PROCEDURE: 04/17/2012 DATE OF DISCHARGE:  04/17/2012                              OPERATIVE REPORT   DIAGNOSIS:  Phimosis and recurrent balanitis.  PROCEDURES: 1. Circumcision. 2. Penile block.  FINDINGS:  Moderate phimosis, stigmata of balanitis previously.  COMPLICATIONS:  None.  SPECIMEN:  Foreskin.  ESTIMATED BLOOD LOSS:  Nil.  INDICATION:  Tyler Bridges is a pleasant 66-year-old gentleman with history of diabetes and recurrent phimosis and balanitis.  He has undergone multiple rounds of topical therapy and the episode have been increasing in frequency.  He is undergoing left hernia repair today by Dr. Streck and he has requested concomitant circumcision.  He was examined in the office and found to be a suitable candidate with the goal of reducing his risk of future balanitis episodes and worsening phimosis.  Informed consent was obtained and placed in the medical record.  PROCEDURE IN DETAIL:  The patient being Tyler Bridges, was verified. Procedure being circumcision of penile block was confirmed.  Procedure was carried out.  Time-out was performed.  Intravenous antibiotics were administered.  Dr. Streck had already completed his left inguinal hernia repair.  A new sterile field was created by prepping and draping the patient's penis, scrotum, and infraumbilical abdomen using iodine x3. Next, the proximal and distal foreskin leaflets were marked.  The distal end being approximately 8 mm proximal to the corona of the glans and the proximal corresponding to the corona of the glans and the un-stretched penis.  Circumferential incision was made along these two lines down to the subcutaneous tissue.  Hemostats were applied at the 12 o'clock position.  An  incision was made down the midline connecting these two incisions.  Redundant prepuce was then released from underlying tissue using Bovie electrocautery.  Additional hemostasis was achieved with point electrocautery.  The skin edges were reapproximated by first placing interrupted Vicryl U-stitches 4 corners and then placing a single running 3-0 Vicryl circumferentially, this resulted excellent hemostasis.  Penile block was performed by infiltrating 5 mL of 0.25% Marcaine just beneath the pubic ramus directed at the dorsal penile nerve, additional 5 mL was placed at the base of the penis in a ring block fashion.  A loose dressing was applied over the incision site after Dermabond.  Procedure was then terminated.  The patient tolerated the procedure well.  No immediate periprocedural complications.  The patient was taken to the postanesthesia care unit in stable condition.          ______________________________ Gavon Majano, MD     TM/MEDQ  D:  04/17/2012  T:  04/17/2012  Job:  [id number] 

## 2012-04-18 NOTE — Op Note (Deleted)
Tyler Bridges, Tyler Bridges NO.:  [id number]  MEDICAL RECORD NO.:  [id number]  LOCATION:                               FACILITY:  [facility]  PHYSICIAN:  Sebastian Ache, MD     DATE OF BIRTH:  [birth year]  DATE OF PROCEDURE: 04/17/2012 DATE OF DISCHARGE:  04/17/2012                              OPERATIVE REPORT   DIAGNOSIS:  Phimosis and recurrent balanitis.  PROCEDURES: 1. Circumcision. 2. Penile block.  FINDINGS:  Moderate phimosis, stigmata of balanitis previously.  COMPLICATIONS:  None.  SPECIMEN:  Foreskin.  ESTIMATED BLOOD LOSS:  Nil.  INDICATION:  [name] is a pleasant 66 year old gentleman with history of diabetes and recurrent phimosis and balanitis.  He has undergone multiple rounds of topical therapy and the episode have been increasing in frequency.  He is undergoing left hernia repair today by Dr. Jamey Ripa and he has requested concomitant circumcision.  He was examined in the office and found to be a suitable candidate with the goal of reducing his risk of future balanitis episodes and worsening phimosis.  Informed consent was obtained and placed in the medical record.  PROCEDURE IN DETAIL:  The patient being [facility], was verified. Procedure being circumcision of penile block was confirmed.  Procedure was carried out.  Time-out was performed.  Intravenous antibiotics were administered.  Dr. Jamey Ripa had already completed his left inguinal hernia repair.  A new sterile field was created by prepping and draping the patient's penis, scrotum, and infraumbilical abdomen using iodine x3. Next, the proximal and distal foreskin leaflets were marked.  The distal end being approximately 8 mm proximal to the corona of the glans and the proximal corresponding to the corona of the glans and the un-stretched penis.  Circumferential incision was made along these two lines down to the subcutaneous tissue.  Hemostats were applied at the 12 o'clock position.  An  incision was made down the midline connecting these two incisions.  Redundant prepuce was then released from underlying tissue using Bovie electrocautery.  Additional hemostasis was achieved with point electrocautery.  The skin edges were reapproximated by first placing interrupted Vicryl U-stitches 4 corners and then placing a single running 3-0 Vicryl circumferentially, this resulted excellent hemostasis.  Penile block was performed by infiltrating 5 mL of 0.25% Marcaine just beneath the pubic ramus directed at the dorsal penile nerve, additional 5 mL was placed at the base of the penis in a ring block fashion.  A loose dressing was applied over the incision site after Dermabond.  Procedure was then terminated.  The patient tolerated the procedure well.  No immediate periprocedural complications.  The patient was taken to the postanesthesia care unit in stable condition.          ______________________________ Sebastian Ache, MD     TM/MEDQ  D:  04/17/2012  T:  04/17/2012  Job:  [id number]

## 2012-04-19 ENCOUNTER — Telehealth (INDEPENDENT_AMBULATORY_CARE_PROVIDER_SITE_OTHER): Payer: Self-pay

## 2012-04-19 NOTE — Telephone Encounter (Signed)
Pt's wife called stating pt c/o constipation. Advised per protocol to use milk of mag adult does and repeat if no result in 8 hours. Also advised to start miralax daily until BMs regular. Pt to limit narcotics and use advil or tylenol instead if able to tolerate these meds. Pt to call back if no BM in 24 hours.

## 2012-04-24 ENCOUNTER — Telehealth (INDEPENDENT_AMBULATORY_CARE_PROVIDER_SITE_OTHER): Payer: Self-pay | Admitting: General Surgery

## 2012-04-24 NOTE — Telephone Encounter (Signed)
Left message on machine for patient's wife to call back. Made her aware to ask for triage. TO let her know we can call in norco per protocol or ask Dr Jamey Ripa about refill of oxycodone this afternoon when he is in the office. Awaiting call back.

## 2012-04-24 NOTE — Telephone Encounter (Signed)
Wife of pt spilled pain meds while trying to open cap (she has arthritis in her hands.)  Called in Hydrocodone 5/325 mg, # 30, 1-2 po Q4-6H prn pain, no refill to [facility] [phone].  Requested no safety cap.

## 2012-04-24 NOTE — Telephone Encounter (Signed)
Message copied by Liliana Cline on Wed Apr 24, 2012 10:39 AM ------      Message from: Isaias Sakai K      Created: Wed Apr 24, 2012 10:18 AM      Regarding: Dr Jamey Ripa      Contact: [phone]       Wife would like to pick up rx for husband today during her break. (Generic oxycodone) Would like to pick up between 11:30-1:30p.

## 2012-05-07 ENCOUNTER — Encounter (INDEPENDENT_AMBULATORY_CARE_PROVIDER_SITE_OTHER): Payer: Self-pay | Admitting: Surgery

## 2012-05-07 ENCOUNTER — Ambulatory Visit (INDEPENDENT_AMBULATORY_CARE_PROVIDER_SITE_OTHER): Payer: Medicare Other | Admitting: Surgery

## 2012-05-07 VITALS — BP 120/86 | HR 72 | Temp 97.4°F | Resp 18 | Ht 68.0 in | Wt 208.0 lb

## 2012-05-07 DIAGNOSIS — K4090 Unilateral inguinal hernia, without obstruction or gangrene, not specified as recurrent: Secondary | ICD-10-CM

## 2012-05-07 DIAGNOSIS — Z09 Encounter for follow-up examination after completed treatment for conditions other than malignant neoplasm: Secondary | ICD-10-CM

## 2012-05-07 MED ORDER — HYDROCODONE-ACETAMINOPHEN 5-325 MG PO TABS: 1.0000 | ORAL_TABLET | Freq: Four times a day (QID) | ORAL | Status: DC | PRN

## 2012-05-07 NOTE — Progress Notes (Signed)
NAME: Tyler Bridges                                            DOB: June 19, 1945 DATE: 05/07/2012                                                  MRN: [id number]  CC: Post op   HPI: This patient comes in for post op follow-up .Heunderwent Left inguinal hernia repair on 04/17/2012. He feels that he is doing well.  PE:  VITAL SIGNS: BP 120/86  Pulse 72  Temp 97.4 F (36.3 C) (Temporal)  Resp 18  Ht 5\' 8"  (1.727 m)  Wt 208 lb (94.348 kg)  BMI 31.63 kg/m2  General: The patient appears to be healthy, NAD Incision: healing nicely, no problems  DATA REVIEWED: Notes from the urologist  IMPRESSION: The patient is doing well S/P Repair of large LIH.    PLAN: RTC PRN Refilled his pain meds

## 2012-05-07 NOTE — Patient Instructions (Signed)
We will see you again on an as needed basis. Please call the office at [phone] if you have any questions or concerns. Thank you for allowing us to take care of you.  

## 2012-06-13 ENCOUNTER — Telehealth (INDEPENDENT_AMBULATORY_CARE_PROVIDER_SITE_OTHER): Payer: Self-pay

## 2012-06-13 MED ORDER — HYDROCODONE-ACETAMINOPHEN 5-325 MG PO TABS: 1.0000 | ORAL_TABLET | Freq: Four times a day (QID) | ORAL | Status: DC | PRN

## 2012-06-13 NOTE — Telephone Encounter (Signed)
The wife called and reports the pt is still in pain after lih repair.  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  It hurts when he is up, moving around and lifting.  He wants a refill on Hydrocodone.  His incision is ok.  He is eating ok, urinating and having bowel movements.  I asked Dr Biagio Quint since Dr Jamey Ripa is unavailable.  He authorized a refill.  I called in to [facility] [phone] Hydrocodone 5/325 one po q 6 hrs prn pain no refill.   I notified the wife.

## 2012-07-10 ENCOUNTER — Other Ambulatory Visit: Payer: Self-pay | Admitting: *Deleted

## 2012-07-10 ENCOUNTER — Other Ambulatory Visit: Payer: Self-pay | Admitting: Internal Medicine

## 2012-07-10 NOTE — Telephone Encounter (Signed)
Review.

## 2012-07-19 ENCOUNTER — Encounter: Payer: Self-pay | Admitting: Internal Medicine

## 2012-07-19 ENCOUNTER — Ambulatory Visit (INDEPENDENT_AMBULATORY_CARE_PROVIDER_SITE_OTHER): Payer: Medicare Other | Admitting: Internal Medicine

## 2012-07-19 VITALS — BP 127/74 | HR 69 | Temp 97.2°F | Ht 68.5 in | Wt 215.2 lb

## 2012-07-19 DIAGNOSIS — E119 Type 2 diabetes mellitus without complications: Secondary | ICD-10-CM

## 2012-07-19 DIAGNOSIS — I10 Essential (primary) hypertension: Secondary | ICD-10-CM

## 2012-07-19 DIAGNOSIS — E781 Pure hyperglyceridemia: Secondary | ICD-10-CM

## 2012-07-19 DIAGNOSIS — R1032 Left lower quadrant pain: Secondary | ICD-10-CM

## 2012-07-19 DIAGNOSIS — K219 Gastro-esophageal reflux disease without esophagitis: Secondary | ICD-10-CM

## 2012-07-19 DIAGNOSIS — E7800 Pure hypercholesterolemia, unspecified: Secondary | ICD-10-CM

## 2012-07-19 HISTORY — DX: Left lower quadrant pain: R10.32

## 2012-07-19 MED ORDER — HYDROCODONE-ACETAMINOPHEN 5-325 MG PO TABS: ORAL_TABLET | ORAL | Status: DC

## 2012-07-19 MED ORDER — ZOSTER VACCINE LIVE 19400 UNT/0.65ML ~~LOC~~ SOLR: 0.6500 mL | Freq: Once | SUBCUTANEOUS | Status: DC

## 2012-07-19 MED ORDER — GLUCOSE BLOOD VI STRP: ORAL_STRIP | Status: DC

## 2012-07-19 MED ORDER — FENOFIBRATE 48 MG PO TABS: 96.0000 mg | ORAL_TABLET | Freq: Every morning | ORAL | Status: DC

## 2012-07-19 NOTE — Addendum Note (Signed)
Addended by: [name] on: 07/19/2012 05:16 PM   Modules accepted: Orders

## 2012-07-19 NOTE — Assessment & Plan Note (Addendum)
  Component Value Date/Time   CHOL 311* 02/22/2012 1016   TRIG 1257* 02/22/2012 1016   HDL 19* 02/22/2012 1016   LDLCALC NOT CALC 02/22/2012 1016   LDLDIRECT 45 02/22/2012 1016   VLDL NOT CALC 02/22/2012 1016   CHOLHDL 16.4 02/22/2012 1016     Assessment: Patient is doing well on fenofibrate (Tricor) 96 mg daily.  Plan: Patient will return for a comprehensive metabolic panel and fasting lipid panel within one week.

## 2012-07-19 NOTE — Assessment & Plan Note (Signed)
BP Readings from Last 3 Encounters:  07/19/12 127/74  05/07/12 120/86  04/17/12 134/73    Lab Results  Component Value Date   NA 139 04/17/2012   K 4.3 04/17/2012   CREATININE 0.80 04/17/2012    Assessment:  Blood pressure control: controlled  Progress toward BP goal:  at goal  Comments: Blood pressure is well controlled on no antihypertensive medication.  Plan:  Medications:  Continue lifestyle management; no need for medication at this time.  Educational resources provided: brochure  Self management tools provided: home blood pressure logbook  Other plans: Check a metabolic panel today

## 2012-07-19 NOTE — Assessment & Plan Note (Signed)
Assessment: Symptoms are controlled on Zegerid OTC 20 mg daily.  Plan: Continue Zegerid OTC 20 mg daily.

## 2012-07-19 NOTE — Assessment & Plan Note (Addendum)
Lipids:    Component Value Date/Time   CHOL 311* 02/22/2012 1016   TRIG 1257* 02/22/2012 1016   HDL 19* 02/22/2012 1016   LDLCALC NOT CALC 02/22/2012 1016   LDLDIRECT 45 02/22/2012 1016   VLDL NOT CALC 02/22/2012 1016   CHOLHDL 16.4 02/22/2012 1016    Assessment: Patient is doing well on fenofibrate (Tricor) 96 mg daily.  Plan: Patient will return for a comprehensive metabolic panel and fasting lipid panel within one week.

## 2012-07-19 NOTE — Assessment & Plan Note (Addendum)
Lab Results  Component Value Date   HGBA1C 6.6 02/22/2012   HGBA1C 6.5 06/21/2011   HGBA1C 6.2 11/16/2010     Assessment:  Diabetes control: good control (HgbA1C at goal)  Progress toward A1C goal:  at goal  Comments: Patient is doing well on metformin XR 1000 mg daily.  Plan:  Medications:  continue current medications  Home glucose monitoring:   Frequency: once a day   Timing: before breakfast Instruction/counseling given: reminded to get eye exam, reminded to bring blood glucose meter & log to each visit and reminded to bring medications to each visit  Educational resources provided: brochure  Self management tools provided:    Other plans: Check a metabolic panel today.

## 2012-07-19 NOTE — Progress Notes (Signed)
  Subjective:    Patient ID: Tyler Bridges, male    DOB: [birth year], 67 y.o.   MRN: [id number]  HPI Patient returns for followup of his diabetes mellitus, hyperlipidemia, hypertension, GERD, and other chronic medical problems.  He underwent left inguinal hernia repair by Dr. Jamey Ripa and circumcision by Dr. Berneice Heinrich on 04/17/2012, and reports that he healed well following surgery.  He still has some mild residual pain in the left inguinal area of the surgical site which he has discussed with Dr. Jamey Ripa.  He reports that he has been taking one half of a hydrocodone 5 mg-acetaminophen 325 mg tablet approximately daily with reasonable relief, although the pain does return to some extent later in the day.  He has some chronic GERD symptoms which appear to be well controlled on Zegerid OTC.  Patient has been taking an over-the-counter sleep medication which contains diphenhydramine; he reports good results with no apparent side effects from the medication.   Review of Systems  Constitutional: Negative for fever, chills and diaphoresis.  Respiratory: Negative for shortness of breath.   Cardiovascular: Negative for chest pain and leg swelling.  Gastrointestinal: Negative for nausea, vomiting and blood in stool.  Genitourinary: Negative for dysuria and difficulty urinating.       Objective:   Physical Exam  Constitutional: No distress.  Cardiovascular: Normal rate, regular rhythm and normal heart sounds.  Exam reveals no gallop and no friction rub.   No murmur heard. Pulmonary/Chest: Effort normal and breath sounds normal. No respiratory distress. He has no wheezes. He has no rales.  Abdominal: Soft. Bowel sounds are normal. He exhibits no distension and no mass. There is no rebound and no guarding.    Musculoskeletal: He exhibits no edema.       Assessment & Plan:

## 2012-07-19 NOTE — Assessment & Plan Note (Signed)
Assessment: Patient has some mild residual left inguinal pain following a hernia repair done in December of 2013 by Dr. Jamey Ripa.  Exam shows minimal tenderness.  He has been getting partial relief with hydrocodone 5 mg-acetaminophen 325 mg one-half tablet once a day.  Plan: Hydrocodone 5 mg/acetaminophen 325 mg one half tablet twice a day as needed.  I advised patient to use this medication only when needed.

## 2012-07-19 NOTE — Patient Instructions (Signed)
General Instructions: Please return for fasting blood work within 1 week.   Treatment Goals:  Goals (1 Years of Data) as of 07/19/12         02/22/12 06/21/11     Result Component    . HEMOGLOBIN A1C < 7.0  6.6 6.5    . LDL CALC < 100  NOT CALC       Progress Toward Treatment Goals:  Treatment Goal 07/19/2012  Hemoglobin A1C at goal  Blood pressure at goal    Self Care Goals & Plans:  Self Care Goal 07/19/2012  Manage my medications take my medicines as prescribed; bring my medications to every visit; refill my medications on time  Eat healthy foods drink diet soda or water instead of juice or soda; eat more vegetables; eat foods that are low in salt    Home Blood Glucose Monitoring 07/19/2012  Check my blood sugar once a day  When to check my blood sugar before breakfast     Care Management & Community Referrals:  Referral 07/19/2012  Referrals made for care management support none needed

## 2012-10-04 ENCOUNTER — Encounter: Payer: Self-pay | Admitting: Family Medicine

## 2012-10-04 ENCOUNTER — Ambulatory Visit (INDEPENDENT_AMBULATORY_CARE_PROVIDER_SITE_OTHER): Payer: Medicare Other | Admitting: Family Medicine

## 2012-10-04 VITALS — BP 127/76 | HR 71 | Ht 68.0 in | Wt 210.0 lb

## 2012-10-04 DIAGNOSIS — M79644 Pain in right finger(s): Secondary | ICD-10-CM

## 2012-10-04 DIAGNOSIS — M79609 Pain in unspecified limb: Secondary | ICD-10-CM

## 2012-10-04 NOTE — Patient Instructions (Addendum)
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 Usually doing x-rays is not necessary unless we're worried about you having a new fracture. Take tylenol 500mg  1-2 tabs three times a day for pain. Aleve 1-2 tabs twice a day with food Glucosamine sulfate 750mg  twice a day is a supplement that may help moderate to severe arthritis. Capsaicin topically up to four times a day may also help with pain. Cortisone injections are an option - you were given one today. Heat or ice 15 minutes at a time 3-4 times a day as needed to help with pain. Follow up with me as needed.

## 2012-10-07 ENCOUNTER — Encounter: Payer: Self-pay | Admitting: Family Medicine

## 2012-10-07 DIAGNOSIS — M79646 Pain in unspecified finger(s): Secondary | ICD-10-CM | POA: Insufficient documentation

## 2012-10-07 NOTE — Assessment & Plan Note (Signed)
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  No h/o rheumatoid arthritis, gout.  Discussed radiographs unlikely to change our management.  Discussed tylenol, nsaids, capsaicin, glucosamine.  He wanted to try an intraarticular injection which was also given today.  See instructions for further.  After informed written consent patient was seated in chair.  Ultrasound used to identify right 2nd MCP joint.  Alcohol swab used to prep area then 2nd MCP injected with 0.5:0.5 mL marcaine: depomedrol.  Patient tolerated procedure well without immediate complications.
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
## 2012-11-04 ENCOUNTER — Other Ambulatory Visit: Payer: Self-pay | Admitting: Internal Medicine

## 2012-11-04 NOTE — Telephone Encounter (Signed)
I refilled Tricor as requested.  Please have patient come in for previously ordered labs (CMP and lipid panel).

## 2012-11-05 ENCOUNTER — Other Ambulatory Visit: Payer: Self-pay | Admitting: Internal Medicine

## 2012-11-13 ENCOUNTER — Ambulatory Visit (INDEPENDENT_AMBULATORY_CARE_PROVIDER_SITE_OTHER): Payer: Medicare Other | Admitting: Internal Medicine

## 2012-11-13 ENCOUNTER — Encounter: Payer: Self-pay | Admitting: Internal Medicine

## 2012-11-13 VITALS — BP 126/75 | HR 63 | Temp 97.4°F | Ht 68.5 in | Wt 214.2 lb

## 2012-11-13 DIAGNOSIS — K219 Gastro-esophageal reflux disease without esophagitis: Secondary | ICD-10-CM

## 2012-11-13 DIAGNOSIS — E119 Type 2 diabetes mellitus without complications: Secondary | ICD-10-CM

## 2012-11-13 DIAGNOSIS — E781 Pure hyperglyceridemia: Secondary | ICD-10-CM

## 2012-11-13 DIAGNOSIS — I10 Essential (primary) hypertension: Secondary | ICD-10-CM

## 2012-11-13 LAB — GLUCOSE, CAPILLARY: Glucose-Capillary: 174 mg/dL — ABNORMAL HIGH (ref 70–99)

## 2012-11-13 LAB — CBC WITH DIFFERENTIAL/PLATELET
HCT: 40.9 % (ref 39.0–52.0)
Hemoglobin: 14.2 g/dL (ref 13.0–17.0)
Lymphocytes Relative: 43 % (ref 12–46)
Monocytes Absolute: 0.6 10*3/uL (ref 0.1–1.0)
Monocytes Relative: 10 % (ref 3–12)
Neutro Abs: 2.7 10*3/uL (ref 1.7–7.7)
WBC: 6.1 10*3/uL (ref 4.0–10.5)

## 2012-11-13 LAB — LIPID PANEL: Triglycerides: 497 mg/dL — ABNORMAL HIGH (ref <150)

## 2012-11-13 NOTE — Patient Instructions (Signed)
General Instructions: Continue current medications. Please have Dr. Dione Booze send a copy of your eye exam to me.   Progress Toward Treatment Goals:  Treatment Goal 11/13/2012  Hemoglobin A1C at goal  Blood pressure at goal    Self Care Goals & Plans:  Self Care Goal 11/13/2012  Manage my medications take my medicines as prescribed; bring my medications to every visit; refill my medications on time  Monitor my health bring my glucose meter and log to each visit; keep track of my blood glucose  Eat healthy foods eat more vegetables    Home Blood Glucose Monitoring 11/13/2012  Check my blood sugar once a day  When to check my blood sugar before breakfast     Care Management & Community Referrals:  Referral 11/13/2012  Referrals made for care management support none needed  Referrals made to community resources none

## 2012-11-13 NOTE — Assessment & Plan Note (Signed)
BP Readings from Last 3 Encounters:  11/13/12 126/75  10/04/12 127/76  07/19/12 127/74    Lab Results  Component Value Date   NA 139 04/17/2012   K 4.3 04/17/2012   CREATININE 0.80 04/17/2012    Assessment: Blood pressure control: controlled Progress toward BP goal:  at goal Comments: Blood pressure is normal on no medications for hypertension  Plan: Medications:  none Other plans: continue to follow blood pressure

## 2012-11-13 NOTE — Progress Notes (Signed)
  Subjective:    Patient ID: Tyler Bridges, male    DOB: 12/28/1945, 67 y.o.   MRN: [id number]  HPI Patient returns for followup of his diabetes mellitus, hypercholesterolemia, hypertension, and other chronic medical problems.  He has no acute complaints, and reports that he has been doing well.  His left inguinal pain has improved, and he stopped taking the hydrocodone/acetaminophen more than 2 months ago.  He occasionally has mild mid-abdominal discomfort which is chronic for years and stable.  He reports that he has been compliant with his medications.  He has not yet gotten the Zostavax vaccine administered, and still has the prescription.  He has an appointment with his ophthalmologist Dr. Dione Booze today.  His right index finger pain is much improved following an injection in the sports medicine clinic.  Review of Systems  Constitutional: Negative for fever, chills and diaphoresis.  Respiratory: Negative for shortness of breath.   Cardiovascular: Negative for chest pain and leg swelling.  Gastrointestinal: Negative for nausea, vomiting, diarrhea and blood in stool.  Endocrine: Negative for polydipsia, polyphagia and polyuria.  Genitourinary: Negative for dysuria.  Musculoskeletal: Positive for arthralgias (Stable chronic right knee pain). Negative for myalgias.  Neurological: Negative for dizziness and syncope.       Objective:   Physical Exam  Constitutional: No distress.  Cardiovascular: Normal rate, regular rhythm, S1 normal and S2 normal.  Exam reveals no gallop and no friction rub.   No murmur heard. Pulses:      Dorsalis pedis pulses are 3+ on the right side, and 3+ on the left side.       Posterior tibial pulses are 3+ on the right side, and 3+ on the left side.  Pulmonary/Chest: Effort normal and breath sounds normal. No respiratory distress. He has no wheezes. He has no rales.  Abdominal: Soft. Bowel sounds are normal. He exhibits no distension. There is no hepatosplenomegaly.  There is no tenderness. There is no rebound and no guarding.  Musculoskeletal: He exhibits no edema.        Assessment & Plan:

## 2012-11-13 NOTE — Assessment & Plan Note (Signed)
Assessment: Patient is doing well on Zegerid OTC 20 mg daily, with no active symptoms.  Plan: Continue Zegerid OTC 20 mg daily.

## 2012-11-13 NOTE — Assessment & Plan Note (Signed)
  Component Value Date/Time   CHOL 311* 02/22/2012 1016   TRIG 1257* 02/22/2012 1016   HDL 19* 02/22/2012 1016   LDLCALC NOT CALC 02/22/2012 1016   LDLDIRECT 45 02/22/2012 1016   VLDL NOT CALC 02/22/2012 1016   CHOLHDL 16.4 02/22/2012 1016     Assessment: Patient is taking fenofibrate (Tricor) 96 mg daily with no apparent problems.  He did not return for a fasting lipid panel and comprehensive metabolic panel following his last visit.  Patient reports that he is fasting today.  Plan: Check a comprehensive metabolic panel and lipid panel today; continue fenofibrate 96 mg daily pending that result.

## 2012-11-13 NOTE — Assessment & Plan Note (Signed)
Lab Results  Component Value Date   HGBA1C 6.5 11/13/2012   HGBA1C 6.6 02/22/2012   HGBA1C 6.5 06/21/2011     Assessment: Diabetes control: good control (HgbA1C at goal) Progress toward A1C goal:  at goal Comments: Diabetes is well controlled on metformin XR 1000 mg daily  Plan: Medications:  continue current medications Home glucose monitoring: Frequency: once a day Timing: before breakfast Instruction/counseling given: reminded to get eye exam Self management tools provided: copy of home glucose meter download Other plans: check a CBC today

## 2012-11-14 ENCOUNTER — Other Ambulatory Visit: Payer: Self-pay

## 2012-11-14 LAB — COMPLETE METABOLIC PANEL WITH GFR
ALT: 20 U/L (ref 0–53)
CO2: 23 mEq/L (ref 19–32)
Calcium: 8.9 mg/dL (ref 8.4–10.5)
Chloride: 106 mEq/L (ref 96–112)
Creat: 0.89 mg/dL (ref 0.50–1.35)
GFR, Est African American: >89 mL/min
Total Protein: 6.7 g/dL (ref 6.0–8.3)

## 2012-11-14 NOTE — Addendum Note (Signed)
Addended by: Remus Blake on: 11/14/2012 01:50 PM   Modules accepted: Orders

## 2012-11-21 ENCOUNTER — Telehealth: Payer: Self-pay | Admitting: Internal Medicine

## 2012-11-21 DIAGNOSIS — E781 Pure hyperglyceridemia: Secondary | ICD-10-CM

## 2012-11-21 MED ORDER — FENOFIBRATE 48 MG PO TABS: 144.0000 mg | ORAL_TABLET | Freq: Every day | ORAL | Status: DC

## 2012-11-21 NOTE — Assessment & Plan Note (Signed)
Telephone Contact:  Lipids:    Component Value Date/Time   CHOL 219* 11/13/2012 0929   TRIG 497* 11/13/2012 0929   HDL 28* 11/13/2012 0929   LDLCALC Comment:   Not calculated due to Triglyceride >400.  11/13/2012 0929   LDLDIRECT 60 11/13/2012 0929   VLDL NOT CALC 11/13/2012 0929   CHOLHDL 7.8 11/13/2012 0929    Triglycerides  Date Value Range Status  11/13/2012 497* <150 mg/dL Final  16/02/9603 5409* <150 mg/dL Final     Result confirmed by automatic dilution.     Result repeated and verified.  06/21/2011 451* <150 mg/dL Final     Assessment: Patient's triglycerides have improved substantially from October 2013, on a dose of fenofibrate 96 mg daily.  Given his increased cardiovascular risk, his triglycerides should ideally be lowered further under current guidelines.  Plan: The plan is to increase fenofibrate 48 mg to a dose of 3 tablets daily (equals 144 mg daily).  I called patient and discussed his lab results and the risks and benefits of increasing the fenofibrate dose.  He is in agreement with increasing the dose.  I discussed the risk of muscle toxicity and I advised him to call right away if he develops any muscle aches, cramps, or other problems.  I also advised him of potential kidney side effects and the need to monitor his renal function periodically.  If he does well on the higher dose, then in the future can switch to a single 145 mg tablet.

## 2012-11-21 NOTE — Telephone Encounter (Signed)
Lipids:    Component Value Date/Time   CHOL 219* 11/13/2012 0929   TRIG 497* 11/13/2012 0929   HDL 28* 11/13/2012 0929   LDLCALC Comment:   Not calculated due to Triglyceride >400.  11/13/2012 0929   LDLDIRECT 60 11/13/2012 0929   VLDL NOT CALC 11/13/2012 0929   CHOLHDL 7.8 11/13/2012 0929    Triglycerides  Date Value Range Status  11/13/2012 497* <150 mg/dL Final  13/24/4010 2725* <150 mg/dL Final     Result confirmed by automatic dilution.     Result repeated and verified.  06/21/2011 451* <150 mg/dL Final     Assessment: Patient's triglycerides have improved substantially from October 2013, on a dose of fenofibrate 96 mg daily.  Given his increased cardiovascular risk, his triglycerides should ideally be lowered further under current guidelines.  Plan: The plan is to increase fenofibrate 48 mg to a dose of 3 tablets daily (equals 144 mg daily).  I called patient and discussed his lab results and the risks and benefits of increasing the fenofibrate dose.  He is in agreement with increasing the dose.  I discussed the risk of muscle toxicity and I advised him to call right away if he develops any muscle aches, cramps, or other problems.  I also advised him of potential kidney side effects and the need to monitor his renal function periodically.  If he does well on the higher dose, then in the future can switch to a single 145 mg tablet.

## 2013-01-03 ENCOUNTER — Telehealth: Payer: Self-pay | Admitting: *Deleted

## 2013-01-03 NOTE — Telephone Encounter (Signed)
Received prior authorization request from pt's pharmacy for fenofibrate. Per phone noted dated 11/21/12,  medication was increased to 48 mg 3 pills once daily, but pt's  Insurance has a max of 2 pills daily .  Will forward information to pcp for review,  to see if prescription can be increased to the 145 mg tabs daily. Please advise.Kingsley Spittle Cassady8/29/201412:21 PM

## 2013-01-09 MED ORDER — FENOFIBRATE 145 MG PO TABS: 145.0000 mg | ORAL_TABLET | Freq: Every day | ORAL | Status: DC

## 2013-01-09 NOTE — Telephone Encounter (Signed)
I changed to fenofibrate 145 mg one tablet daily.

## 2013-01-13 ENCOUNTER — Encounter (HOSPITAL_BASED_OUTPATIENT_CLINIC_OR_DEPARTMENT_OTHER): Payer: Self-pay | Admitting: *Deleted

## 2013-01-13 ENCOUNTER — Emergency Department (HOSPITAL_BASED_OUTPATIENT_CLINIC_OR_DEPARTMENT_OTHER)
Admission: EM | Admit: 2013-01-13 | Discharge: 2013-01-13 | Disposition: A | Discharge status: Home / Self Care | Payer: Medicare Other | Attending: Emergency Medicine | Admitting: Emergency Medicine

## 2013-01-13 DIAGNOSIS — W268XXA Contact with other sharp object(s), not elsewhere classified, initial encounter: Secondary | ICD-10-CM | POA: Insufficient documentation

## 2013-01-13 DIAGNOSIS — Z79899 Other long term (current) drug therapy: Secondary | ICD-10-CM | POA: Insufficient documentation

## 2013-01-13 DIAGNOSIS — Z7982 Long term (current) use of aspirin: Secondary | ICD-10-CM | POA: Insufficient documentation

## 2013-01-13 DIAGNOSIS — K219 Gastro-esophageal reflux disease without esophagitis: Secondary | ICD-10-CM | POA: Insufficient documentation

## 2013-01-13 DIAGNOSIS — G8929 Other chronic pain: Secondary | ICD-10-CM | POA: Insufficient documentation

## 2013-01-13 DIAGNOSIS — Z87828 Personal history of other (healed) physical injury and trauma: Secondary | ICD-10-CM | POA: Insufficient documentation

## 2013-01-13 DIAGNOSIS — E781 Pure hyperglyceridemia: Secondary | ICD-10-CM | POA: Insufficient documentation

## 2013-01-13 DIAGNOSIS — Y929 Unspecified place or not applicable: Secondary | ICD-10-CM | POA: Insufficient documentation

## 2013-01-13 DIAGNOSIS — M1710 Unilateral primary osteoarthritis, unspecified knee: Secondary | ICD-10-CM | POA: Insufficient documentation

## 2013-01-13 DIAGNOSIS — Z87448 Personal history of other diseases of urinary system: Secondary | ICD-10-CM | POA: Insufficient documentation

## 2013-01-13 DIAGNOSIS — Z8669 Personal history of other diseases of the nervous system and sense organs: Secondary | ICD-10-CM | POA: Insufficient documentation

## 2013-01-13 DIAGNOSIS — E119 Type 2 diabetes mellitus without complications: Secondary | ICD-10-CM | POA: Insufficient documentation

## 2013-01-13 DIAGNOSIS — S61209A Unspecified open wound of unspecified finger without damage to nail, initial encounter: Secondary | ICD-10-CM | POA: Insufficient documentation

## 2013-01-13 DIAGNOSIS — M19019 Primary osteoarthritis, unspecified shoulder: Secondary | ICD-10-CM | POA: Insufficient documentation

## 2013-01-13 DIAGNOSIS — Z8719 Personal history of other diseases of the digestive system: Secondary | ICD-10-CM | POA: Insufficient documentation

## 2013-01-13 DIAGNOSIS — IMO0002 Reserved for concepts with insufficient information to code with codable children: Secondary | ICD-10-CM | POA: Insufficient documentation

## 2013-01-13 DIAGNOSIS — Y9389 Activity, other specified: Secondary | ICD-10-CM | POA: Insufficient documentation

## 2013-01-13 DIAGNOSIS — S61012A Laceration without foreign body of left thumb without damage to nail, initial encounter: Secondary | ICD-10-CM

## 2013-01-13 DIAGNOSIS — Z87891 Personal history of nicotine dependence: Secondary | ICD-10-CM | POA: Insufficient documentation

## 2013-01-13 DIAGNOSIS — E7800 Pure hypercholesterolemia, unspecified: Secondary | ICD-10-CM | POA: Insufficient documentation

## 2013-01-13 MED ORDER — HYDROCODONE-ACETAMINOPHEN 5-325 MG PO TABS: 2.0000 | ORAL_TABLET | ORAL | Status: DC | PRN

## 2013-01-13 MED ORDER — CEPHALEXIN 500 MG PO CAPS: 500.0000 mg | ORAL_CAPSULE | Freq: Four times a day (QID) | ORAL | Status: DC
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
## 2013-01-13 NOTE — ED Provider Notes (Signed)
Medical screening examination/treatment/procedure(s) were performed by non-physician practitioner and as supervising physician I was immediately available for consultation/collaboration.   Dairon B. Sheldon, MD 01/13/13 2134 

## 2013-01-13 NOTE — ED Notes (Signed)
Last pm pulling up old flooring cut left thumb states since he is a diabetic his wife convinced him to come in. Bleeding controlled, laceration goes through the thumbnail

## 2013-04-23 ENCOUNTER — Ambulatory Visit: Payer: Medicare Other | Attending: Orthopaedic Surgery | Admitting: Physical Therapy

## 2013-04-23 DIAGNOSIS — M1710 Unilateral primary osteoarthritis, unspecified knee: Secondary | ICD-10-CM | POA: Insufficient documentation

## 2013-04-23 DIAGNOSIS — E119 Type 2 diabetes mellitus without complications: Secondary | ICD-10-CM | POA: Insufficient documentation

## 2013-04-23 DIAGNOSIS — M545 Low back pain, unspecified: Secondary | ICD-10-CM | POA: Insufficient documentation

## 2013-04-23 DIAGNOSIS — IMO0001 Reserved for inherently not codable concepts without codable children: Secondary | ICD-10-CM | POA: Insufficient documentation

## 2013-04-29 ENCOUNTER — Ambulatory Visit: Payer: Medicare Other | Admitting: Physical Therapy

## 2013-04-30 ENCOUNTER — Ambulatory Visit: Payer: Medicare Other | Admitting: Physical Therapy

## 2013-05-06 ENCOUNTER — Ambulatory Visit: Payer: Medicare Other | Admitting: Physical Therapy

## 2013-05-09 ENCOUNTER — Ambulatory Visit: Payer: Medicare Other | Attending: Orthopaedic Surgery | Admitting: Rehabilitation

## 2013-05-09 DIAGNOSIS — M545 Low back pain, unspecified: Secondary | ICD-10-CM | POA: Insufficient documentation

## 2013-05-09 DIAGNOSIS — M1710 Unilateral primary osteoarthritis, unspecified knee: Secondary | ICD-10-CM | POA: Insufficient documentation

## 2013-05-09 DIAGNOSIS — IMO0001 Reserved for inherently not codable concepts without codable children: Secondary | ICD-10-CM | POA: Insufficient documentation

## 2013-05-13 ENCOUNTER — Ambulatory Visit: Payer: Medicare Other | Admitting: Physical Therapy

## 2013-05-16 ENCOUNTER — Encounter: Payer: Medicare Other | Admitting: Physical Therapy

## 2013-05-20 ENCOUNTER — Encounter: Payer: Medicare Other | Admitting: Rehabilitation

## 2013-05-23 ENCOUNTER — Encounter: Payer: Medicare Other | Admitting: Rehabilitation

## 2013-06-11 ENCOUNTER — Telehealth: Payer: Self-pay | Admitting: Internal Medicine

## 2013-06-11 ENCOUNTER — Encounter: Payer: Self-pay | Admitting: Internal Medicine

## 2013-06-11 ENCOUNTER — Ambulatory Visit (INDEPENDENT_AMBULATORY_CARE_PROVIDER_SITE_OTHER): Payer: Medicare Other | Admitting: Internal Medicine

## 2013-06-11 VITALS — BP 140/80 | HR 72 | Temp 97.8°F | Ht 68.5 in | Wt 225.1 lb

## 2013-06-11 DIAGNOSIS — E781 Pure hyperglyceridemia: Secondary | ICD-10-CM

## 2013-06-11 DIAGNOSIS — Z23 Encounter for immunization: Secondary | ICD-10-CM

## 2013-06-11 DIAGNOSIS — I10 Essential (primary) hypertension: Secondary | ICD-10-CM

## 2013-06-11 DIAGNOSIS — E119 Type 2 diabetes mellitus without complications: Secondary | ICD-10-CM

## 2013-06-11 LAB — GLUCOSE, CAPILLARY: Glucose-Capillary: 334 mg/dL — ABNORMAL HIGH (ref 70–99)

## 2013-06-11 LAB — POCT GLYCOSYLATED HEMOGLOBIN (HGB A1C): Hemoglobin A1C: 7.2

## 2013-06-11 MED ORDER — METFORMIN HCL ER 500 MG PO TB24: 1500.0000 mg | ORAL_TABLET | Freq: Every day | ORAL | Status: DC

## 2013-06-11 MED ORDER — ZOSTER VACCINE LIVE 19400 UNT/0.65ML ~~LOC~~ SOLR: 0.6500 mL | Freq: Once | SUBCUTANEOUS | Status: DC

## 2013-06-11 NOTE — Assessment & Plan Note (Signed)
Telephone Contact:  Lipids:    Component Value Date/Time   CHOL 219* 11/13/2012 0929   TRIG 497* 11/13/2012 0929   HDL 28* 11/13/2012 0929   LDLCALC Comment:   Not calculated due to Triglyceride >400.  11/13/2012 0929   LDLDIRECT 60 11/13/2012 0929   VLDL NOT CALC 11/13/2012 0929   CHOLHDL 7.8 11/13/2012 0929    Triglycerides  Date Value Range Status  11/13/2012 497* <150 mg/dL Final  16/10/960410/17/2013 [id number]* <150 mg/dL Final     Result confirmed by automatic dilution.     Result repeated and verified.  06/21/2011 451* <150 mg/dL Final     Assessment: Patient is now on fenofibrate 145 mg daily with no apparent side effects.  Plan: Patient will return next week for comprehensive metabolic panel and lipid panel.  The plan is to continue fenofibrate 145 mg daily pending the results.

## 2013-06-11 NOTE — Progress Notes (Signed)
   Subjective:    Patient ID: Tyler Bridges, male    DOB: [birth year], 68 y.o.   MRN: [id number]  HPI Patient returns for followup of his diabetes mellitus, hypertriglyceridemia, and other medical problems.  He reports improvement in his abdominal pain since his last visit, and is no longer needing hydrocodone/acetaminophen for the pain.  He reports that he hurt his back in November while hunting when he stepped in a hole; he saw a Landchiropractor and underwent a course of physical therapy and reports that his back pain is much better.  He has not been checking his blood sugar frequently.   Review of Systems  Constitutional: Negative for fever, chills and diaphoresis.  Respiratory: Negative for cough, shortness of breath and wheezing.   Cardiovascular: Negative for chest pain and leg swelling.  Gastrointestinal: Negative for nausea, vomiting, abdominal pain and blood in stool.  Genitourinary: Negative for dysuria and difficulty urinating.     I reviewed and updated current medications, allergies, past medical history, family history, and social history     Objective:   Physical Exam  Constitutional: No distress.  Cardiovascular: Normal rate, regular rhythm and normal heart sounds.  Exam reveals no gallop and no friction rub.   No murmur heard. Pulmonary/Chest: Effort normal and breath sounds normal. No respiratory distress. He has no wheezes. He has no rales.  Abdominal: Bowel sounds are normal. He exhibits no distension. There is no tenderness. There is no rebound and no guarding.  Musculoskeletal: He exhibits no edema.       Assessment & Plan:

## 2013-06-11 NOTE — Patient Instructions (Signed)
General Instructions: Increase metformin ER 500 mg to a dose of three tablets once daily. Return in 1 week for fasting blood work and follow up of blood pressure.     Progress Toward Treatment Goals:  Treatment Goal 06/11/2013  Hemoglobin A1C deteriorated  Blood pressure unchanged    Self Care Goals & Plans:  Self Care Goal 11/13/2012  Manage my medications take my medicines as prescribed; bring my medications to every visit; refill my medications on time  Monitor my health bring my glucose meter and log to each visit; keep track of my blood glucose  Eat healthy foods eat more vegetables    Home Blood Glucose Monitoring 06/11/2013  Check my blood sugar once a day  When to check my blood sugar before breakfast     Care Management & Community Referrals:  Referral 06/11/2013  Referrals made for care management support none needed  Referrals made to community resources none

## 2013-06-11 NOTE — Assessment & Plan Note (Addendum)
Lab Results  Component Value Date   HGBA1C 7.2 06/11/2013   HGBA1C 6.5 11/13/2012   HGBA1C 6.6 02/22/2012     Assessment: Diabetes control: fair control Progress toward A1C goal:  deteriorated Comments: Patient's hemoglobin A1c is now above 7 on metformin XR 1000 mg daily  Plan: Medications:  Increase metformin XR to a dose of 1500 mg once daily Home glucose monitoring: Frequency: once a day Timing: before breakfast Instruction/counseling given: reminded to bring medications to each visit and discussed diet Other plans: Obtain a urine microalbumin/creatinine ratio when patient returns for labs next week.  Request a copy of his last eye exam from his ophthalmologist Dr. Dione BoozeGroat (patient says he was seen there within the past few months).

## 2013-06-11 NOTE — Telephone Encounter (Signed)
Called Dr. Laruth BouchardGroat's office.  Last OV was 11/13/2012.  Not being faxed.  Next appointment is 11/18/2013 at 10:30am.

## 2013-06-11 NOTE — Assessment & Plan Note (Signed)
BP Readings from Last 3 Encounters:  06/11/13 140/80  01/13/13 148/98  11/13/12 126/75    Lab Results  Component Value Date   NA 138 11/13/2012   K 4.3 11/13/2012   CREATININE 0.89 11/13/2012    Assessment: Blood pressure control: mildly elevated Progress toward BP goal:  unchanged Comments: Patient is currently on no medications for his blood pressure  Plan: Medications:  Currently on no medications Other plans: Recheck blood pressure when patient returns next week

## 2013-06-20 ENCOUNTER — Other Ambulatory Visit (INDEPENDENT_AMBULATORY_CARE_PROVIDER_SITE_OTHER): Payer: Medicare Other

## 2013-06-20 DIAGNOSIS — E781 Pure hyperglyceridemia: Secondary | ICD-10-CM

## 2013-06-20 DIAGNOSIS — E119 Type 2 diabetes mellitus without complications: Secondary | ICD-10-CM

## 2013-06-20 LAB — COMPLETE METABOLIC PANEL WITH GFR
ALK PHOS: 89 U/L (ref 39–117)
ALT: 19 U/L (ref 0–53)
AST: 15 U/L (ref 0–37)
Albumin: 4 g/dL (ref 3.5–5.2)
BILIRUBIN TOTAL: 0.5 mg/dL (ref 0.2–1.2)
BUN: 15 mg/dL (ref 6–23)
CO2: 23 meq/L (ref 19–32)
Calcium: 9.1 mg/dL (ref 8.4–10.5)
Chloride: 104 mEq/L (ref 96–112)
Creat: 0.87 mg/dL (ref 0.50–1.35)
GFR, EST NON AFRICAN AMERICAN: 88 mL/min
GLUCOSE: 196 mg/dL — AB (ref 70–99)
Potassium: 4.4 mEq/L (ref 3.5–5.3)
SODIUM: 137 meq/L (ref 135–145)
TOTAL PROTEIN: 7 g/dL (ref 6.0–8.3)

## 2013-06-20 LAB — LIPID PANEL
CHOLESTEROL: 224 mg/dL — AB (ref 0–200)
HDL: 25 mg/dL — AB (ref >39)
Total CHOL/HDL Ratio: 9 Ratio
Triglycerides: 598 mg/dL — ABNORMAL HIGH (ref <150)

## 2013-06-21 LAB — MICROALBUMIN / CREATININE URINE RATIO
CREATININE, URINE: 163.9 mg/dL
MICROALB UR: 0.5 mg/dL (ref 0.00–1.89)
Microalb Creat Ratio: 3.1 mg/g (ref 0.0–30.0)

## 2013-06-23 LAB — LDL CHOLESTEROL, DIRECT: LDL DIRECT: 58 mg/dL

## 2013-06-23 NOTE — Addendum Note (Signed)
Addended by: Remus BlakeBARROW, Adyline Huberty K on: 06/23/2013 09:13 AM   Modules accepted: Orders

## 2013-07-26 ENCOUNTER — Other Ambulatory Visit: Payer: Self-pay | Admitting: Internal Medicine

## 2013-07-28 ENCOUNTER — Other Ambulatory Visit: Payer: Self-pay | Admitting: Internal Medicine

## 2013-09-24 ENCOUNTER — Other Ambulatory Visit: Payer: Self-pay | Admitting: Internal Medicine

## 2013-11-18 ENCOUNTER — Other Ambulatory Visit (INDEPENDENT_AMBULATORY_CARE_PROVIDER_SITE_OTHER): Payer: Medicare Other

## 2013-11-18 DIAGNOSIS — E119 Type 2 diabetes mellitus without complications: Secondary | ICD-10-CM

## 2013-11-18 LAB — POCT GLYCOSYLATED HEMOGLOBIN (HGB A1C): HEMOGLOBIN A1C: 6.7

## 2013-11-26 ENCOUNTER — Ambulatory Visit (INDEPENDENT_AMBULATORY_CARE_PROVIDER_SITE_OTHER): Payer: Medicare Other | Admitting: Internal Medicine

## 2013-11-26 ENCOUNTER — Encounter: Payer: Self-pay | Admitting: Internal Medicine

## 2013-11-26 VITALS — BP 130/80 | HR 70 | Temp 97.3°F | Ht 68.5 in | Wt 209.2 lb

## 2013-11-26 DIAGNOSIS — E119 Type 2 diabetes mellitus without complications: Secondary | ICD-10-CM

## 2013-11-26 DIAGNOSIS — K219 Gastro-esophageal reflux disease without esophagitis: Secondary | ICD-10-CM

## 2013-11-26 DIAGNOSIS — E781 Pure hyperglyceridemia: Secondary | ICD-10-CM

## 2013-11-26 DIAGNOSIS — I10 Essential (primary) hypertension: Secondary | ICD-10-CM

## 2013-11-26 LAB — CBC WITH DIFFERENTIAL/PLATELET
BASOS ABS: 0.1 10*3/uL (ref 0.0–0.1)
BASOS PCT: 1 % (ref 0–1)
Eosinophils Absolute: 0.1 10*3/uL (ref 0.0–0.7)
Eosinophils Relative: 2 % (ref 0–5)
HCT: 43.3 % (ref 39.0–52.0)
Hemoglobin: 14.7 g/dL (ref 13.0–17.0)
Lymphocytes Relative: 43 % (ref 12–46)
Lymphs Abs: 2.7 10*3/uL (ref 0.7–4.0)
MCH: 28.8 pg (ref 26.0–34.0)
MCHC: 33.9 g/dL (ref 30.0–36.0)
MCV: 84.7 fL (ref 78.0–100.0)
MONOS PCT: 11 % (ref 3–12)
Monocytes Absolute: 0.7 10*3/uL (ref 0.1–1.0)
NEUTROS ABS: 2.7 10*3/uL (ref 1.7–7.7)
NEUTROS PCT: 43 % (ref 43–77)
PLATELETS: 284 10*3/uL (ref 150–400)
RBC: 5.11 MIL/uL (ref 4.22–5.81)
RDW: 14.2 % (ref 11.5–15.5)
WBC: 6.3 10*3/uL (ref 4.0–10.5)

## 2013-11-26 LAB — COMPLETE METABOLIC PANEL WITH GFR
ALBUMIN: 3.9 g/dL (ref 3.5–5.2)
ALT: 16 U/L (ref 0–53)
AST: 16 U/L (ref 0–37)
Alkaline Phosphatase: 73 U/L (ref 39–117)
BILIRUBIN TOTAL: 0.5 mg/dL (ref 0.2–1.2)
BUN: 12 mg/dL (ref 6–23)
CALCIUM: 8.7 mg/dL (ref 8.4–10.5)
CHLORIDE: 105 meq/L (ref 96–112)
CO2: 24 meq/L (ref 19–32)
Creat: 0.73 mg/dL (ref 0.50–1.35)
GFR, Est African American: >89 mL/min
GLUCOSE: 139 mg/dL — AB (ref 70–99)
POTASSIUM: 4.7 meq/L (ref 3.5–5.3)
SODIUM: 138 meq/L (ref 135–145)
Total Protein: 6.9 g/dL (ref 6.0–8.3)

## 2013-11-26 LAB — LIPID PANEL
Cholesterol: 220 mg/dL — ABNORMAL HIGH (ref 0–200)
HDL: 32 mg/dL — AB (ref >39)
Total CHOL/HDL Ratio: 6.9 Ratio
Triglycerides: 466 mg/dL — ABNORMAL HIGH (ref <150)

## 2013-11-26 MED ORDER — ESOMEPRAZOLE MAGNESIUM 20 MG PO CPDR: 20.0000 mg | DELAYED_RELEASE_CAPSULE | Freq: Every day | ORAL | Status: DC

## 2013-11-26 NOTE — Assessment & Plan Note (Signed)
Lipids:    Component Value Date/Time   CHOL 224* 06/20/2013 0926   TRIG 598* 06/20/2013 0926   HDL 25* 06/20/2013 0926   LDLCALC NOT CALC 06/20/2013 0926   LDLDIRECT 58 06/20/2013 0926   VLDL NOT CALC 06/20/2013 0926   CHOLHDL 9.0 06/20/2013 0926    Assessment: Patient has a history of severe hypertriglyceridemia, but reports that he stopped taking the fenofibrate about 2 months ago because he thought it might be causing GI side effects.  He has been taking a natural supplement but he can't remember the name of the supplement that he is taking.  I discussed with him the clinical indications for treating the severe elevations of his triglycerides including lowering the risk of pancreatitis and also reducing the cardiovascular risk associated with elevated triglycerides.  Plan: Will check a lipid panel today (patient reports that he is fasting).  I will then discuss with him the best option for managing his hypertriglyceridemia.  He did indicate a willingness to try again with the fenofibrate.

## 2013-11-26 NOTE — Patient Instructions (Signed)
General Instructions: Please schedule an appointment for your annual eye exam. Take Nexium 20 mg one capsule daily for reflux symptoms.  Progress Toward Treatment Goals:  Treatment Goal 11/26/2013  Hemoglobin A1C at goal  Blood pressure at goal    Self Care Goals & Plans:  Self Care Goal 11/26/2013  Manage my medications -  Monitor my health -  Eat healthy foods -  Meeting treatment goals maintain the current self-care plan    Home Blood Glucose Monitoring 11/26/2013  Check my blood sugar once a day  When to check my blood sugar before breakfast     Care Management & Community Referrals:  Referral 11/26/2013  Referrals made for care management support none needed  Referrals made to community resources none

## 2013-11-26 NOTE — Assessment & Plan Note (Signed)
BP Readings from Last 3 Encounters:  11/26/13 130/80  06/11/13 140/80  01/13/13 148/98    Lab Results  Component Value Date   NA 137 06/20/2013   K 4.4 06/20/2013   CREATININE 0.87 06/20/2013    Assessment: Blood pressure control: controlled Progress toward BP goal:  at goal Comments: Patient's blood pressure is well controlled; he is currently on no medications.  His weight loss has likely been beneficial as well in maintaining good control of his blood pressure without medication.  Plan: Medications:  Continue therapeutic lifestyle management

## 2013-11-26 NOTE — Assessment & Plan Note (Signed)
Lab Results  Component Value Date   HGBA1C 6.7 11/18/2013   HGBA1C 7.2 06/11/2013   HGBA1C 6.5 11/13/2012     Assessment: Diabetes control: good control (HgbA1C at goal) Progress toward A1C goal:  at goal Comments: Diabetes is well controlled on metformin XR 1000 mg daily (patient self-reduced his dose from 1500 mg daily to 1000 mg daily following his last visit).  He has lost 16 pounds as well, and this is likely showing benefit in terms of his diabetes  Plan: Medications:  continue current medications Home glucose monitoring: Frequency: once a day Timing: before breakfast Instruction/counseling given: reminded to get eye exam, reminded to bring blood glucose meter & log to each visit and reminded to bring medications to each visit Educational resources provided: handout Other plans: Check a CBC with differential today

## 2013-11-26 NOTE — Assessment & Plan Note (Signed)
Assessment: Patient reports that he has been taking Nexium daily.  Plan: Continue Nexium 20 mg daily.

## 2013-11-26 NOTE — Addendum Note (Signed)
Addended by: Margarito LinerJOINES, Jamis Kryder on: 11/26/2013 03:06 PM   Modules accepted: Orders

## 2013-11-26 NOTE — Progress Notes (Signed)
   Subjective:    Patient ID: Tyler Bridges, male    DOB: 05-07-1946, 68 y.o.   MRN: [id number]  HPI Patient returns for management of his diabetes mellitus, hyperlipidemia, hypertension, and other chronic medical problems.  He reports that he has been doing well, and has no acute complaints today.  He reports that he cut back on the quantity of food and started eating a healthier diet, and has lost about 16 pounds since February.  He stopped taking fenofibrate about 2 months ago because he thought it might be causing GI upset; he also reduced his metformin at that time from 1500 mg daily to a dose of 1000 mg daily. He reports occasional midabdominal pain triggered by eating roughage such as corn; the last episode of this was about 3 weeks ago.   Review of Systems  Constitutional: Negative for fever, chills and diaphoresis.  Respiratory: Negative for cough and shortness of breath.   Cardiovascular: Negative for chest pain and leg swelling.  Gastrointestinal: Positive for nausea (Occasional, not recently) and abdominal pain (Occasional midabdominal pain triggered by "roughage", last time about 3 weeks ago). Negative for vomiting and blood in stool.  Genitourinary: Negative for dysuria and frequency.  Musculoskeletal: Negative for back pain.       Objective:   Physical Exam  Constitutional: No distress.  Cardiovascular: Normal rate, regular rhythm and normal heart sounds.  Exam reveals no gallop and no friction rub.   No murmur heard. Pulmonary/Chest: Effort normal and breath sounds normal. No respiratory distress. He has no wheezes. He has no rales.  Abdominal: Soft. Bowel sounds are normal. He exhibits no distension. There is no tenderness. There is no rebound and no guarding.  Musculoskeletal: He exhibits no edema.       Assessment & Plan:

## 2013-11-27 LAB — LDL CHOLESTEROL, DIRECT: LDL DIRECT: 57 mg/dL

## 2013-11-27 NOTE — Addendum Note (Signed)
Addended by: Margarito LinerJOINES, Raetta Agostinelli on: 11/27/2013 10:22 AM   Modules accepted: Orders

## 2014-02-16 ENCOUNTER — Ambulatory Visit (INDEPENDENT_AMBULATORY_CARE_PROVIDER_SITE_OTHER): Payer: Medicare Other | Admitting: Internal Medicine

## 2014-02-16 ENCOUNTER — Encounter: Payer: Self-pay | Admitting: Internal Medicine

## 2014-02-16 VITALS — BP 143/78 | HR 63 | Temp 97.9°F | Ht 68.0 in | Wt 216.7 lb

## 2014-02-16 DIAGNOSIS — J209 Acute bronchitis, unspecified: Secondary | ICD-10-CM | POA: Insufficient documentation

## 2014-02-16 DIAGNOSIS — R350 Frequency of micturition: Secondary | ICD-10-CM

## 2014-02-16 DIAGNOSIS — E119 Type 2 diabetes mellitus without complications: Secondary | ICD-10-CM

## 2014-02-16 DIAGNOSIS — Z23 Encounter for immunization: Secondary | ICD-10-CM

## 2014-02-16 DIAGNOSIS — I10 Essential (primary) hypertension: Secondary | ICD-10-CM

## 2014-02-16 MED ORDER — GUAIFENESIN-CODEINE 100-10 MG/5ML PO SYRP: 5.0000 mL | ORAL_SOLUTION | Freq: Three times a day (TID) | ORAL | Status: DC | PRN

## 2014-02-16 NOTE — Assessment & Plan Note (Signed)
Most likely the cause of his cough is a viral bronchitis. No signs or symptoms of pneumonia. Discussed with him the risks and benefits of antibiotic therapy in this setting. Reassured him that he is likely to get better over the next few days. For his cough, recommended a codeine-based cough suppressant, which he has used in the past with some good results. He is agreeable. Recommended that he comes back if he doesn't feel better over the next few days to a week. Discussed with him the signs and symptoms of pneumonia including chest pain, fever, shortness of breath, or increased fatigue. Plan - No antibiotics indicated as pneumonia is less likely - If he continues to feel unwell, may consider doing a chest x-ray -Guaifenesin - codeine syrup -Followup as needed

## 2014-02-16 NOTE — Assessment & Plan Note (Addendum)
BP Readings from Last 3 Encounters:  02/16/14 143/78  11/26/13 130/80  06/11/13 140/80    Lab Results  Component Value Date   NA 138 11/26/2013   K 4.7 11/26/2013   CREATININE 0.73 11/26/2013    Assessment: Blood pressure control: mildly elevated Progress toward BP goal:  unchanged Comments: Slightly elevated systolic BP of 143.  Plan: Medications:  Will continue to watch his blood pressure closely. If it remains high, may cause the starting him on an ACE inhibitor.  Educational resources provided:   Self management tools provided:   Other plans: Followup with the PCP in 1 to 2 months.

## 2014-02-16 NOTE — Progress Notes (Addendum)
Patient ID: Tyler FreerCharles L Bridges, male   DOB: 1946-05-04, 68 y.o.   MRN: [id number]   Subjective:   HPI: Tyler Bridges is a 68 y.o. gentleman with past medical history of DM2, HTN and bilateral knee OA.   Reason(s) for this visit: 1. Cough:  Cough has been present for the last 2 weeks. Productive of green sputum, without hemoptysis. He has otherwise been able to go to work, and he denies any fevers or chills. His appetite has been normal. He denies headaches, nausea, vomiting. He denies chest pain with inspiration, shortness of breath and wheezing. He has been trying over-the-counter cough suppressants without much relief. He is planning to go for hunting trip and wanted to know whether antibiotics might be indicated. 2. Increased urinary frequency: This is a chronic problem. He has a urologist who he is seeing next week. Otherwise, he denies fevers, chills, abdominal pain, or other symptoms. No hematuria, or dysuria. He also sometimes feels that he can not empty his bladder, and his stream is weak. He has no dribbling of urine. He reports urinary urgency. He has no documented problems with prostate.   ROS: CVS: No chest pain, palpitations and leg swelling.  GI: No abdominal pain, nausea, vomiting, bloody stools MSK: No myalgias, back pain, joint swelling, arthralgias  Psych: No depression symptoms. No SI or SA.    Objective:  Physical Exam: Filed Vitals:   02/16/14 0829  BP: 143/78  Pulse: 63  Temp: 97.9 F (36.6 C)  TempSrc: Oral  Height: 5\' 8"  (1.727 m)  Weight: 216 lb 11.2 oz (98.294 kg)  SpO2: 98%   General: Well nourished. No acute distress.  HEENT: Normal oral mucosa. MMM. Oropharynx without exudates, or hyperemia. Lungs: CTA bilaterally. No wheezing, or rales. No increased work of breathing. Not dyspneic.  Heart: RRR; no extra sounds or murmurs  Abdomen: Non-distended, normal bowel sounds, soft, nontender; no hepatosplenomegaly.no flank tenderness. Rectal exam not done.    Extremities: No pedal edema. No joint swelling or tenderness. Neurologic: Normal EOM,  Alert and oriented x3. No obvious neurologic/cranial nerve deficits.  Assessment & Plan:  Discussed case with my attending in the clinic, Dr. Criselda PeachesMullen See problem based charting.

## 2014-02-16 NOTE — Patient Instructions (Addendum)
General Instructions: Please take robitussin Ac for your cough  See the signs of pneumonia below. If you have these signs please ask to be seen here.  Otherwise follow up with Dr Meredith PelJoines in 1-2 months   Please bring your medicines with you each time you come to clinic.  Medicines may include prescription medications, over-the-counter medications, herbal remedies, eye drops, vitamins, or other pills.   Progress Toward Treatment Goals:  Treatment Goal 02/16/2014  Hemoglobin A1C at goal  Blood pressure unchanged    Self Care Goals & Plans:  Self Care Goal 02/16/2014  Manage my medications take my medicines as prescribed; bring my medications to every visit  Monitor my health -  Eat healthy foods -  Be physically active -  Other -  Meeting treatment goals -    Home Blood Glucose Monitoring 02/16/2014  Check my blood sugar -  When to check my blood sugar before breakfast     Care Management & Community Referrals:  Referral 11/26/2013  Referrals made for care management support none needed  Referrals made to community resources none      Acute Bronchitis Bronchitis is when the airways that extend from the windpipe into the lungs get red, puffy, and painful (inflamed). Bronchitis often causes thick spit (mucus) to develop. This leads to a cough. A cough is the most common symptom of bronchitis. In acute bronchitis, the condition usually begins suddenly and goes away over time (usually in 2 weeks). Smoking, allergies, and asthma can make bronchitis worse. Repeated episodes of bronchitis may cause more lung problems. HOME CARE  Rest.  Drink enough fluids to keep your pee (urine) clear or pale yellow (unless you need to limit fluids as told by your doctor).  Only take over-the-counter or prescription medicines as told by your doctor.  Avoid smoking and secondhand smoke. These can make bronchitis worse. If you are a smoker, think about using nicotine gum or skin patches.  Quitting smoking will help your lungs heal faster.  Reduce the chance of getting bronchitis again by:  Washing your hands often.  Avoiding people with cold symptoms.  Trying not to touch your hands to your mouth, nose, or eyes.  Follow up with your doctor as told. GET HELP IF: Your symptoms do not improve after 1 week of treatment. Symptoms include:  Cough.  Fever.  Coughing up thick spit.  Body aches.  Chest congestion.  Chills.  Shortness of breath.  Sore throat. GET HELP RIGHT AWAY IF:   You have an increased fever.  You have chills.  You have severe shortness of breath.  You have bloody thick spit (sputum).  You throw up (vomit) often.  You lose too much body fluid (dehydration).  You have a severe headache.  You faint. MAKE SURE YOU:   Understand these instructions.  Will watch your condition.  Will get help right away if you are not doing well or get worse. Document Released: 10/11/2007 Document Revised: 12/25/2012 Document Reviewed: 10/15/2012 [facility] Patient Information 2015 [place] [place]. This information is not intended to replace advice given to you by your health care provider. Make sure you discuss any questions you have with your health care provider.

## 2014-02-16 NOTE — Assessment & Plan Note (Addendum)
Lab Results  Component Value Date   HGBA1C 6.7 11/18/2013   HGBA1C 7.2 06/11/2013   HGBA1C 6.5 11/13/2012     Assessment: Diabetes control: good control (HgbA1C at goal) Progress toward A1C goal:  at goal Comments: none  Plan: Medications:  Continue with metformin 1000 mg daily Home glucose monitoring: Frequency:   Timing: before breakfast Instruction/counseling given: discussed the need for weight loss Educational resources provided:   Self management tools provided:   Other plans: follow up with PCP in 1-2 month.

## 2014-02-16 NOTE — Assessment & Plan Note (Signed)
Assessment: Most likely diagnosis: BPH in view of chronicity of his symptoms and advanced age.  Ddx: I do not suspect a urinary tract infection.   Plan: 1. Labs/imaging: Urinalysis 2. Therapy: none  Follow up: Patient has a urologist appointment next week. Encouraged him to discuss with him. Will call him with results.

## 2014-02-17 LAB — URINALYSIS, COMPLETE
Bacteria, UA: NONE SEEN
Bilirubin Urine: NEGATIVE
Casts: NONE SEEN
Crystals: NONE SEEN
Glucose, UA: 500 mg/dL — AB
HGB URINE DIPSTICK: NEGATIVE
Ketones, ur: NEGATIVE mg/dL
Leukocytes, UA: NEGATIVE
NITRITE: NEGATIVE
PROTEIN: NEGATIVE mg/dL
Specific Gravity, Urine: 1.023 (ref 1.005–1.030)
Squamous Epithelial / LPF: NONE SEEN
Urobilinogen, UA: 0.2 mg/dL (ref 0.0–1.0)
pH: 5 (ref 5.0–8.0)

## 2014-02-17 NOTE — Progress Notes (Signed)
Internal Medicine Clinic Attending  Case discussed with Dr. Kazibwe soon after the resident saw the patient.  We reviewed the resident's history and exam and pertinent patient test results.  I agree with the assessment, diagnosis, and plan of care documented in the resident's note. 

## 2014-04-17 ENCOUNTER — Other Ambulatory Visit: Payer: Self-pay | Admitting: *Deleted

## 2014-04-17 MED ORDER — GLUCOSE BLOOD VI STRP: ORAL_STRIP | Status: DC

## 2014-05-11 ENCOUNTER — Other Ambulatory Visit: Payer: Self-pay | Admitting: *Deleted

## 2014-05-11 MED ORDER — METFORMIN HCL ER 500 MG PO TB24: 1000.0000 mg | ORAL_TABLET | Freq: Every day | ORAL | Status: DC

## 2014-05-11 MED ORDER — GLUCOSE BLOOD VI STRP: ORAL_STRIP | Status: DC

## 2014-05-11 NOTE — Telephone Encounter (Signed)
Pt has new insurance and can get test strips at [facility] pharmacy at no cost. Please resend Rx for one touch to pharmacy.

## 2014-05-12 ENCOUNTER — Other Ambulatory Visit: Payer: Self-pay | Admitting: *Deleted

## 2014-05-12 MED ORDER — ESOMEPRAZOLE MAGNESIUM 20 MG PO CPDR: 20.0000 mg | DELAYED_RELEASE_CAPSULE | Freq: Every day | ORAL | Status: DC

## 2014-05-27 ENCOUNTER — Telehealth: Payer: Self-pay | Admitting: *Deleted

## 2014-05-27 NOTE — Telephone Encounter (Signed)
Pt gets his nexium at [facility], they only carry 40mg  not 20 mg, can we change

## 2014-05-28 MED ORDER — ESOMEPRAZOLE MAGNESIUM 40 MG PO CPDR: 40.0000 mg | DELAYED_RELEASE_CAPSULE | Freq: Every day | ORAL | Status: DC

## 2014-05-28 NOTE — Telephone Encounter (Addendum)
Note Corrected on 06/15/14. I changed his prescription to Nexium 40 mg daily.

## 2014-06-15 ENCOUNTER — Other Ambulatory Visit: Payer: Self-pay | Admitting: Internal Medicine

## 2014-07-06 ENCOUNTER — Encounter (HOSPITAL_BASED_OUTPATIENT_CLINIC_OR_DEPARTMENT_OTHER): Payer: Self-pay | Admitting: *Deleted

## 2014-07-06 NOTE — Progress Notes (Signed)
To come in for ekg-bmet-has had several ortho surgeries here
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
## 2014-07-08 ENCOUNTER — Other Ambulatory Visit: Payer: Self-pay

## 2014-07-08 ENCOUNTER — Encounter (HOSPITAL_BASED_OUTPATIENT_CLINIC_OR_DEPARTMENT_OTHER)
Admission: RE | Admit: 2014-07-08 | Discharge: 2014-07-08 | Disposition: A | Discharge status: Home / Self Care | Payer: PPO | Source: Ambulatory Visit | Attending: Orthopaedic Surgery | Admitting: Orthopaedic Surgery

## 2014-07-08 DIAGNOSIS — R350 Frequency of micturition: Secondary | ICD-10-CM | POA: Diagnosis not present

## 2014-07-08 DIAGNOSIS — K219 Gastro-esophageal reflux disease without esophagitis: Secondary | ICD-10-CM | POA: Diagnosis not present

## 2014-07-08 DIAGNOSIS — N401 Enlarged prostate with lower urinary tract symptoms: Secondary | ICD-10-CM | POA: Diagnosis not present

## 2014-07-08 DIAGNOSIS — E119 Type 2 diabetes mellitus without complications: Secondary | ICD-10-CM | POA: Diagnosis not present

## 2014-07-08 DIAGNOSIS — M179 Osteoarthritis of knee, unspecified: Secondary | ICD-10-CM | POA: Diagnosis not present

## 2014-07-08 DIAGNOSIS — S83207A Unspecified tear of unspecified meniscus, current injury, left knee, initial encounter: Secondary | ICD-10-CM | POA: Diagnosis present

## 2014-07-08 DIAGNOSIS — E780 Pure hypercholesterolemia: Secondary | ICD-10-CM | POA: Diagnosis not present

## 2014-07-08 DIAGNOSIS — N529 Male erectile dysfunction, unspecified: Secondary | ICD-10-CM | POA: Diagnosis not present

## 2014-07-08 DIAGNOSIS — X58XXXA Exposure to other specified factors, initial encounter: Secondary | ICD-10-CM | POA: Diagnosis not present

## 2014-07-08 DIAGNOSIS — Y929 Unspecified place or not applicable: Secondary | ICD-10-CM | POA: Diagnosis not present

## 2014-07-08 DIAGNOSIS — E781 Pure hyperglyceridemia: Secondary | ICD-10-CM | POA: Diagnosis not present

## 2014-07-08 DIAGNOSIS — Z87891 Personal history of nicotine dependence: Secondary | ICD-10-CM | POA: Diagnosis not present

## 2014-07-08 LAB — BASIC METABOLIC PANEL
Anion gap: 11 (ref 5–15)
BUN: 9 mg/dL (ref 6–23)
CO2: 22 mmol/L (ref 19–32)
Calcium: 8.7 mg/dL (ref 8.4–10.5)
Chloride: 101 mmol/L (ref 96–112)
Creatinine, Ser: 0.66 mg/dL (ref 0.50–1.35)
GFR calc Af Amer: >90 mL/min (ref >90)
GLUCOSE: 193 mg/dL — AB (ref 70–99)
Potassium: 4 mmol/L (ref 3.5–5.1)
SODIUM: 134 mmol/L — AB (ref 135–145)

## 2014-07-09 ENCOUNTER — Ambulatory Visit (HOSPITAL_BASED_OUTPATIENT_CLINIC_OR_DEPARTMENT_OTHER)
Admission: RE | Admit: 2014-07-09 | Discharge: 2014-07-09 | Disposition: A | Discharge status: Home / Self Care | Payer: PPO | Source: Ambulatory Visit | Attending: Orthopaedic Surgery | Admitting: Orthopaedic Surgery

## 2014-07-09 ENCOUNTER — Encounter (HOSPITAL_BASED_OUTPATIENT_CLINIC_OR_DEPARTMENT_OTHER)
Admission: RE | Disposition: A | Discharge status: Home / Self Care | Payer: Self-pay | Source: Ambulatory Visit | Attending: Orthopaedic Surgery

## 2014-07-09 ENCOUNTER — Encounter: Payer: PPO | Admitting: Internal Medicine

## 2014-07-09 ENCOUNTER — Encounter (HOSPITAL_BASED_OUTPATIENT_CLINIC_OR_DEPARTMENT_OTHER): Payer: Self-pay | Admitting: *Deleted

## 2014-07-09 ENCOUNTER — Ambulatory Visit (HOSPITAL_BASED_OUTPATIENT_CLINIC_OR_DEPARTMENT_OTHER): Payer: PPO | Admitting: Anesthesiology

## 2014-07-09 DIAGNOSIS — M179 Osteoarthritis of knee, unspecified: Secondary | ICD-10-CM | POA: Insufficient documentation

## 2014-07-09 DIAGNOSIS — S83207A Unspecified tear of unspecified meniscus, current injury, left knee, initial encounter: Secondary | ICD-10-CM | POA: Diagnosis not present

## 2014-07-09 DIAGNOSIS — N529 Male erectile dysfunction, unspecified: Secondary | ICD-10-CM | POA: Insufficient documentation

## 2014-07-09 DIAGNOSIS — N401 Enlarged prostate with lower urinary tract symptoms: Secondary | ICD-10-CM | POA: Insufficient documentation

## 2014-07-09 DIAGNOSIS — X58XXXA Exposure to other specified factors, initial encounter: Secondary | ICD-10-CM | POA: Insufficient documentation

## 2014-07-09 DIAGNOSIS — E781 Pure hyperglyceridemia: Secondary | ICD-10-CM | POA: Insufficient documentation

## 2014-07-09 DIAGNOSIS — E119 Type 2 diabetes mellitus without complications: Secondary | ICD-10-CM | POA: Insufficient documentation

## 2014-07-09 DIAGNOSIS — Y929 Unspecified place or not applicable: Secondary | ICD-10-CM | POA: Insufficient documentation

## 2014-07-09 DIAGNOSIS — R350 Frequency of micturition: Secondary | ICD-10-CM | POA: Insufficient documentation

## 2014-07-09 DIAGNOSIS — Z87891 Personal history of nicotine dependence: Secondary | ICD-10-CM | POA: Insufficient documentation

## 2014-07-09 DIAGNOSIS — E780 Pure hypercholesterolemia: Secondary | ICD-10-CM | POA: Diagnosis not present

## 2014-07-09 DIAGNOSIS — K219 Gastro-esophageal reflux disease without esophagitis: Secondary | ICD-10-CM | POA: Insufficient documentation

## 2014-07-09 HISTORY — PX: KNEE ARTHROSCOPY WITH LATERAL MENISECTOMY: SHX6193

## 2014-07-09 HISTORY — PX: CHONDROPLASTY: SHX5177

## 2014-07-09 HISTORY — PX: KNEE ARTHROSCOPY WITH MEDIAL MENISECTOMY: SHX5651

## 2014-07-09 LAB — POCT HEMOGLOBIN-HEMACUE: HEMOGLOBIN: 13.9 g/dL (ref 13.0–17.0)

## 2014-07-09 LAB — GLUCOSE, CAPILLARY
Glucose-Capillary: 164 mg/dL — ABNORMAL HIGH (ref 70–99)
Glucose-Capillary: 146 mg/dL — ABNORMAL HIGH (ref 70–99)

## 2014-07-09 SURGERY — ARTHROSCOPY, KNEE, WITH MEDIAL MENISCECTOMY
Anesthesia: General | Site: Knee | Laterality: Left

## 2014-07-09 MED ORDER — LIDOCAINE HCL (CARDIAC) 20 MG/ML IV SOLN
INTRAVENOUS | Status: DC | PRN
  Administered 2014-07-09: 60 mg via INTRAVENOUS

## 2014-07-09 MED ORDER — FENTANYL CITRATE 0.05 MG/ML IJ SOLN
INTRAMUSCULAR | Status: DC | PRN
  Administered 2014-07-09: 25 ug via INTRAVENOUS
  Administered 2014-07-09: 100 ug via INTRAVENOUS
  Administered 2014-07-09: 25 ug via INTRAVENOUS

## 2014-07-09 MED ORDER — MIDAZOLAM HCL 5 MG/5ML IJ SOLN
INTRAMUSCULAR | Status: DC | PRN
  Administered 2014-07-09: 1 mg via INTRAVENOUS

## 2014-07-09 MED ORDER — MIDAZOLAM HCL 2 MG/2ML IJ SOLN
INTRAMUSCULAR | Status: AC
  Filled 2014-07-09: qty 2

## 2014-07-09 MED ORDER — MIDAZOLAM HCL 2 MG/2ML IJ SOLN: 1.0000 mg | INTRAMUSCULAR | Status: DC | PRN

## 2014-07-09 MED ORDER — BUPIVACAINE-EPINEPHRINE 0.25% -1:200000 IJ SOLN
INTRAMUSCULAR | Status: DC | PRN
  Administered 2014-07-09: 30 mL

## 2014-07-09 MED ORDER — LACTATED RINGERS IV SOLN
INTRAVENOUS | Status: DC
  Administered 2014-07-09 (×3): via INTRAVENOUS

## 2014-07-09 MED ORDER — HYDROMORPHONE HCL 1 MG/ML IJ SOLN
0.2500 mg | INTRAMUSCULAR | Status: DC | PRN
  Administered 2014-07-09 (×2): 0.5 mg via INTRAVENOUS

## 2014-07-09 MED ORDER — FENTANYL CITRATE 0.05 MG/ML IJ SOLN
INTRAMUSCULAR | Status: AC
  Filled 2014-07-09: qty 4

## 2014-07-09 MED ORDER — SODIUM CHLORIDE 0.9 % IR SOLN
Status: DC | PRN
Start: 2014-07-09 — End: 2014-07-09
  Administered 2014-07-09: 6000 mL

## 2014-07-09 MED ORDER — PROPOFOL 10 MG/ML IV BOLUS
INTRAVENOUS | Status: DC | PRN
  Administered 2014-07-09: 150 mg via INTRAVENOUS

## 2014-07-09 MED ORDER — HYDROCODONE-ACETAMINOPHEN 5-325 MG PO TABS: 1.0000 | ORAL_TABLET | ORAL | Status: DC | PRN

## 2014-07-09 MED ORDER — DEXAMETHASONE SODIUM PHOSPHATE 4 MG/ML IJ SOLN
INTRAMUSCULAR | Status: DC | PRN
  Administered 2014-07-09: 4 mg via INTRAVENOUS

## 2014-07-09 MED ORDER — FENTANYL CITRATE 0.05 MG/ML IJ SOLN: 50.0000 ug | INTRAMUSCULAR | Status: DC | PRN

## 2014-07-09 MED ORDER — FENTANYL CITRATE 0.05 MG/ML IJ SOLN
INTRAMUSCULAR | Status: AC
  Filled 2014-07-09: qty 6

## 2014-07-09 MED ORDER — HYDROMORPHONE HCL 1 MG/ML IJ SOLN
INTRAMUSCULAR | Status: AC
  Filled 2014-07-09: qty 1

## 2014-07-09 MED ORDER — METFORMIN HCL ER 500 MG PO TB24: 1000.0000 mg | ORAL_TABLET | Freq: Every day | ORAL | Status: DC

## 2014-07-09 SURGICAL SUPPLY — 42 items
BANDAGE ELASTIC 6 VELCRO ST LF (GAUZE/BANDAGES/DRESSINGS) ×3 IMPLANT
BLADE 4.2CUDA (BLADE) IMPLANT
BLADE CUDA SHAVER 3.5 (BLADE) ×3 IMPLANT
BLADE CUTTER GATOR 3.5 (BLADE) IMPLANT
BLADE GREAT WHITE 4.2 (BLADE) IMPLANT
BLADE GREAT WHITE 4.2MM (BLADE)
BNDG GAUZE ELAST 4 BULKY (GAUZE/BANDAGES/DRESSINGS) ×3 IMPLANT
DRAPE ARTHROSCOPY W/POUCH 90 (DRAPES) ×3 IMPLANT
DRSG EMULSION OIL 3X3 NADH (GAUZE/BANDAGES/DRESSINGS) ×3 IMPLANT
DURAPREP 26ML APPLICATOR (WOUND CARE) ×3 IMPLANT
ELECT MENISCUS 165MM 90D (ELECTRODE) IMPLANT
ELECT REM PT RETURN 9FT ADLT (ELECTROSURGICAL)
ELECTRODE REM PT RTRN 9FT ADLT (ELECTROSURGICAL) IMPLANT
GLOVE BIO SURGEON STRL SZ7 (GLOVE) IMPLANT
GLOVE BIO SURGEON STRL SZ7.5 (GLOVE) IMPLANT
GLOVE BIO SURGEON STRL SZ8 (GLOVE) ×3 IMPLANT
GLOVE BIOGEL M STRL SZ7.5 (GLOVE) ×3 IMPLANT
GLOVE BIOGEL PI IND STRL 7.5 (GLOVE) IMPLANT
GLOVE BIOGEL PI IND STRL 8 (GLOVE) ×1 IMPLANT
GLOVE BIOGEL PI IND STRL 8.5 (GLOVE) ×1 IMPLANT
GLOVE BIOGEL PI INDICATOR 7.5 (GLOVE)
GLOVE BIOGEL PI INDICATOR 8 (GLOVE) ×2
GLOVE BIOGEL PI INDICATOR 8.5 (GLOVE) ×2
GLOVE ECLIPSE 7.0 STRL STRAW (GLOVE) IMPLANT
GLOVE ECLIPSE 8.0 STRL XLNG CF (GLOVE) ×3 IMPLANT
GLOVE SURG ORTHO 8.5 STRL (GLOVE) IMPLANT
GOWN STRL REUS W/ TWL LRG LVL3 (GOWN DISPOSABLE) IMPLANT
GOWN STRL REUS W/ TWL XL LVL3 (GOWN DISPOSABLE) ×3 IMPLANT
GOWN STRL REUS W/TWL LRG LVL3 (GOWN DISPOSABLE)
GOWN STRL REUS W/TWL XL LVL3 (GOWN DISPOSABLE) ×6
HOLDER KNEE FOAM BLUE (MISCELLANEOUS) ×3 IMPLANT
KNEE WRAP E Z 3 GEL PACK (MISCELLANEOUS) ×3 IMPLANT
MANIFOLD NEPTUNE II (INSTRUMENTS) ×3 IMPLANT
PACK ARTHROSCOPY DSU (CUSTOM PROCEDURE TRAY) ×3 IMPLANT
PACK BASIN DAY SURGERY FS (CUSTOM PROCEDURE TRAY) ×3 IMPLANT
PENCIL BUTTON HOLSTER BLD 10FT (ELECTRODE) IMPLANT
SET ARTHROSCOPY TUBING (MISCELLANEOUS) ×2
SET ARTHROSCOPY TUBING LN (MISCELLANEOUS) ×1 IMPLANT
SUT ETHILON 4 0 PS 2 18 (SUTURE) IMPLANT
TOWEL OR 17X24 6PK STRL BLUE (TOWEL DISPOSABLE) ×3 IMPLANT
WAND STAR VAC 90 (SURGICAL WAND) ×3 IMPLANT
WATER STERILE IRR 1000ML POUR (IV SOLUTION) ×3 IMPLANT

## 2014-07-09 NOTE — Transfer of Care (Signed)
Immediate Anesthesia Transfer of Care Note  Patient: Tyler Bridges  Procedure(s) Performed: Procedure(s): KNEE ARTHROSCOPY WITH MEDIAL MENISECTOMY, CHONDROPLASTY. (Left) CHONDROPLASTY (Left) KNEE ARTHROSCOPY WITH LATERAL MENISECTOMY (Left)  Patient Location: PACU  Anesthesia Type:General  Level of Consciousness: awake, alert  and oriented  Airway & Oxygen Therapy: Patient Spontanous Breathing and Patient connected to face mask oxygen  Post-op Assessment: Report given to RN, Post -op Vital signs reviewed and stable and Patient moving all extremities  Post vital signs: Reviewed and stable  Last Vitals:  Filed Vitals:   07/09/14 1305  BP: 159/78  Pulse: 63  Temp: 36.6 C  Resp: 148    Complications: No apparent anesthesia complications

## 2014-07-09 NOTE — H&P (Signed)
  The recent History & Physical has been reviewed. I have personally examined the patient today. There is no interval change to the documented History & Physical. The patient would like to proceed with the procedure.  Norlene CampbellWHITFIELD, PETER W 07/09/2014,  2:01 PM

## 2014-07-09 NOTE — Op Note (Signed)
PATIENT ID:      Cassie FreerCharles L Retter  MRN:     [id number] DOB/AGE:    [birth year] / 69 y.o.       OPERATIVE REPORT    DATE OF PROCEDURE:  07/09/2014       PREOPERATIVE DIAGNOSIS:   LEFT KNEE MEDIAL MENISCAL TEAR WITH TRICOMPARTMENTAL DJD                                                       Estimated body mass index is 32.56 kg/(m^2) as calculated from the following:   Height as of this encounter: 5\' 8"  (1.727 m).   Weight as of this encounter: 97.126 kg (214 lb 2 oz).     POSTOPERATIVE DIAGNOSIS:   left Knee medial meniscus tear and Degenerative joint disease                                                                     Estimated body mass index is 32.56 kg/(m^2) as calculated from the following:   Height as of this encounter: 5\' 8"  (1.727 m).   Weight as of this encounter: 97.126 kg (214 lb 2 oz).     PROCEDURE:  Procedure(s):LEFT KNEE ARTHROSCOPY WITH PARTIAL MEDIAL AND LATERAL MENISECTOMIES, CHONDROPLASTY OF PATELLA AND MEDIAL COMPARTMENTS     SURGEON:  Norlene CampbellPeter Monaca Wadas, MD    ASSISTANT:   Jacqualine CodeBrian Petrarca, PA-C   (Present and scrubbed throughout the case, critical for assistance with exposure, retraction, instrumentation, and closure.)          ANESTHESIA: local and general     DRAINS: none :      TOURNIQUET TIME: none   COMPLICATIONS:  None   CONDITION:  stable  PROCEDURE IN DETAIL: [id number]: [id number]   Ilynn Stauffer W 07/09/2014, 2:49 PM

## 2014-07-09 NOTE — Anesthesia Postprocedure Evaluation (Signed)
  Anesthesia Post-op Note  Patient: Tyler FreerCharles L Dalsanto  Procedure(s) Performed: Procedure(s): KNEE ARTHROSCOPY WITH MEDIAL MENISECTOMY, CHONDROPLASTY. (Left) CHONDROPLASTY (Left) KNEE ARTHROSCOPY WITH LATERAL MENISECTOMY (Left)  Patient Location: PACU  Anesthesia Type:General  Level of Consciousness: awake and alert   Airway and Oxygen Therapy: Patient Spontanous Breathing  Post-op Pain: none  Post-op Assessment: Post-op Vital signs reviewed  Post-op Vital Signs: Reviewed  Last Vitals:  Filed Vitals:   07/09/14 1545  BP:   Pulse: 68  Temp:   Resp: 14    Complications: No apparent anesthesia complications

## 2014-07-09 NOTE — Anesthesia Procedure Notes (Signed)
Procedure Name: LMA Insertion Date/Time: 07/09/2014 2:14 PM Performed by: Curly ShoresRAFT, Crystie Yanko W Pre-anesthesia Checklist: Patient identified, Emergency Drugs available, Suction available and Patient being monitored Patient Re-evaluated:Patient Re-evaluated prior to inductionOxygen Delivery Method: Circle System Utilized Preoxygenation: Pre-oxygenation with 100% oxygen Intubation Type: IV induction Ventilation: Mask ventilation without difficulty LMA: LMA inserted LMA Size: 5.0 Number of attempts: 1 Airway Equipment and Method: Bite block Placement Confirmation: positive ETCO2 and breath sounds checked- equal and bilateral Tube secured with: Tape Dental Injury: Teeth and Oropharynx as per pre-operative assessment

## 2014-07-09 NOTE — Discharge Instructions (Signed)

## 2014-07-09 NOTE — Anesthesia Preprocedure Evaluation (Addendum)
Anesthesia Evaluation  Patient identified by MRN, date of birth, ID band Patient awake    Reviewed: Allergy & Precautions, H&P , NPO status , Patient's Chart, lab work & pertinent test results  Airway Mallampati: II  TM Distance: >3 FB Neck ROM: Full    Dental no notable dental hx. (+) Teeth Intact, Dental Advisory Given   Pulmonary neg pulmonary ROS, former smoker,  breath sounds clear to auscultation  Pulmonary exam normal       Cardiovascular negative cardio ROS  Rhythm:Regular Rate:Normal     Neuro/Psych negative neurological ROS  negative psych ROS   GI/Hepatic Neg liver ROS, GERD-  Medicated and Controlled,  Endo/Other  diabetes, Type 2, Oral Hypoglycemic Agents  Renal/GU negative Renal ROS  negative genitourinary   Musculoskeletal  (+) Arthritis -, Osteoarthritis,    Abdominal   Peds  Hematology negative hematology ROS (+)   Anesthesia Other Findings   Reproductive/Obstetrics negative OB ROS                            Anesthesia Physical Anesthesia Plan  ASA: II  Anesthesia Plan: General   Post-op Pain Management:    Induction: Intravenous  Airway Management Planned: LMA  Additional Equipment:   Intra-op Plan:   Post-operative Plan: Extubation in OR  Informed Consent: I have reviewed the patients History and Physical, chart, labs and discussed the procedure including the risks, benefits and alternatives for the proposed anesthesia with the patient or authorized representative who has indicated his/her understanding and acceptance.   Dental advisory given  Plan Discussed with: CRNA  Anesthesia Plan Comments:         Anesthesia Quick Evaluation

## 2014-07-10 ENCOUNTER — Encounter (HOSPITAL_BASED_OUTPATIENT_CLINIC_OR_DEPARTMENT_OTHER): Payer: Self-pay | Admitting: Orthopaedic Surgery

## 2014-07-10 NOTE — Op Note (Signed)
NAMEJULUIS, Tyler Bridges NO.:  [id number]  MEDICAL RECORD NO.:  [id number]  LOCATION:                                 FACILITY:  PHYSICIAN:  Vonna Kotyk. Ameira Alessandrini, M.D.DATE OF BIRTH:  1945/06/22  DATE OF PROCEDURE:  07/09/2014 DATE OF DISCHARGE:  07/09/2014                              OPERATIVE REPORT   PREOPERATIVE DIAGNOSIS:  Tear of medial meniscus, left knee with tricompartmental degenerative joint disease.  POSTOPERATIVE DIAGNOSIS:  Tear of medial and lateral meniscus, left knee with tricompartmental degenerative joint disease.  PROCEDURE: 1. Diagnostic arthroscopy, left knee. 2. Partial medial and lateral  meniscectomies. 3. Chondroplasty of medial patellofemoral compartments.  SURGEON:  Vonna Kotyk. Durward Fortes, MD  ASSISTANT:  Mike Craze. Petrarca, PA-C  ANESTHESIA:  General with local 0.25% Marcaine with epinephrine.  COMPLICATIONS:  None.  HISTORY:  A 69 year old gentleman was seen in the office on Monday with acute onset of left knee pain with a twisting injury.  He nearly fell to the ground with inability to fully extend his knee.  I thought that he had a locked knee, placed him on a knee support, gave him oxycodone.  He is now to have an arthroscopic evaluation.  He has had some trouble off and on with that same knee over a period of months.  There is some evidence of degenerative changes particularly with the decrease in the medial joint space.  DESCRIPTION OF PROCEDURE:  [name] was met with his wife in the holding area and identified the left knee as appropriate operative site and marked it accordingly.  The patient was then transported to room #2 and placed under general laryngeal anesthesia without difficulty.  The left lower extremity was placed in a thigh holder.  The leg was prepped with DuraPrep in the thigh holder, in the ankle sterile draping was performed.  Time-out was called.  Arthroscopic portals were infiltrated with 0.25% Marcaine  with epinephrine another side of the patellar tendon.  Small puncture sites were made with 11 blade knife, and the arthroscope was easily placed into the knee.  I initially approached the knee through a lateral portal.  There was a very minimal clear yellow joint effusion.  Diagnostic arthroscopy revealed some mild synovitis in superior pouch, there was diffuse fraying of the patella and particularly the trochlear and lateral femoral condyles.  Beneath the lateral patella, seemed to have a mild tilt.  There was loose articular cartilage and this was debrided with a Cuda shaver and the ArthroCare wand.  The medial compartment was then evaluated.  There was a complex tear of the medial meniscus with an unstable radial tear and a comminuted complex tear along the posterior third.  Using a series of basket forceps, a Cuda shaver, and the ArthroCare wand, the meniscus was debrided back to stable meniscal rim and I carefully probed it to be sure there were no further unstable or torn segments I sealed the edge with the ArthroCare wand.  There was considerable loose articular cartilage from the femur which was debrided with a Cuda shaver.  There were areas that were consistent with grade 3 chondromalacia and an area of grade 4 in  the very posterior aspect of the tibial plateau beneath the torn cartilage.  There were no loose bodies.  The meniscus was now stable, and I had a nice debridement of the articular cartilage.  The ACL and PCL appeared to be intact.  There was an unstable bucket- handle tear of the anterior horn of the lateral meniscus.  This was easily dislodged and was debrided with a Cuda shaver and then the ArthroCare wand.  Since that it was nicely tapered to the junction of the middle third.  The remaining meniscus was little bit frayed along the leading edge but otherwise intact.  There was some grade 2 areas of chondromalacia in the medial compartment predominantly on the  femoral condyle with no loose material.  The joint was then explored with evidence of loose material.  The puncture sites were left open and infiltrated with 0.25% Marcaine with epinephrine.  Sterile bulky dressing was applied followed by an Ace bandage.  PLAN:  Oxycodone for pain.  Ice pack.  Crutches as needed.  Office next week.     Peter W. Whitfield, M.D.     PWW/MEDQ  D:  07/09/2014  T:  07/10/2014  Job:  [id number] 

## 2014-07-24 ENCOUNTER — Encounter: Payer: Self-pay | Admitting: *Deleted

## 2014-07-30 ENCOUNTER — Telehealth: Payer: Self-pay | Admitting: Internal Medicine

## 2014-07-30 NOTE — Telephone Encounter (Signed)
Call to patient to confirm appointment for 08/03/14 at 8:45 lmtcb ° °

## 2014-08-03 ENCOUNTER — Encounter: Payer: Self-pay | Admitting: Internal Medicine

## 2014-08-03 ENCOUNTER — Ambulatory Visit (INDEPENDENT_AMBULATORY_CARE_PROVIDER_SITE_OTHER): Payer: PPO | Admitting: Internal Medicine

## 2014-08-03 VITALS — BP 144/77 | HR 74 | Temp 97.0°F | Wt 217.6 lb

## 2014-08-03 DIAGNOSIS — I10 Essential (primary) hypertension: Secondary | ICD-10-CM | POA: Diagnosis not present

## 2014-08-03 DIAGNOSIS — M1712 Unilateral primary osteoarthritis, left knee: Secondary | ICD-10-CM

## 2014-08-03 DIAGNOSIS — E119 Type 2 diabetes mellitus without complications: Secondary | ICD-10-CM | POA: Diagnosis not present

## 2014-08-03 DIAGNOSIS — Z23 Encounter for immunization: Secondary | ICD-10-CM | POA: Diagnosis not present

## 2014-08-03 DIAGNOSIS — Z125 Encounter for screening for malignant neoplasm of prostate: Secondary | ICD-10-CM

## 2014-08-03 DIAGNOSIS — E781 Pure hyperglyceridemia: Secondary | ICD-10-CM

## 2014-08-03 DIAGNOSIS — R109 Unspecified abdominal pain: Secondary | ICD-10-CM

## 2014-08-03 DIAGNOSIS — K219 Gastro-esophageal reflux disease without esophagitis: Secondary | ICD-10-CM | POA: Diagnosis not present

## 2014-08-03 DIAGNOSIS — Z0000 Encounter for general adult medical examination without abnormal findings: Secondary | ICD-10-CM | POA: Insufficient documentation

## 2014-08-03 DIAGNOSIS — M179 Osteoarthritis of knee, unspecified: Secondary | ICD-10-CM

## 2014-08-03 DIAGNOSIS — G8929 Other chronic pain: Secondary | ICD-10-CM

## 2014-08-03 LAB — CBC WITH DIFFERENTIAL/PLATELET
Basophils Absolute: 0.1 10*3/uL (ref 0.0–0.1)
Basophils Relative: 1 % (ref 0–1)
EOS ABS: 0.1 10*3/uL (ref 0.0–0.7)
EOS PCT: 2 % (ref 0–5)
HCT: 43.1 % (ref 39.0–52.0)
Hemoglobin: 14.7 g/dL (ref 13.0–17.0)
LYMPHS ABS: 2.9 10*3/uL (ref 0.7–4.0)
Lymphocytes Relative: 41 % (ref 12–46)
MCH: 29.3 pg (ref 26.0–34.0)
MCHC: 34.1 g/dL (ref 30.0–36.0)
MCV: 85.9 fL (ref 78.0–100.0)
MONO ABS: 0.8 10*3/uL (ref 0.1–1.0)
MONOS PCT: 11 % (ref 3–12)
MPV: 9.6 fL (ref 8.6–12.4)
Neutro Abs: 3.2 10*3/uL (ref 1.7–7.7)
Neutrophils Relative %: 45 % (ref 43–77)
PLATELETS: 253 10*3/uL (ref 150–400)
RBC: 5.02 MIL/uL (ref 4.22–5.81)
RDW: 13.9 % (ref 11.5–15.5)
WBC: 7.1 10*3/uL (ref 4.0–10.5)

## 2014-08-03 LAB — COMPLETE METABOLIC PANEL WITH GFR
ALBUMIN: 4.1 g/dL (ref 3.5–5.2)
ALK PHOS: 82 U/L (ref 39–117)
ALT: 20 U/L (ref 0–53)
AST: 15 U/L (ref 0–37)
BUN: 11 mg/dL (ref 6–23)
CO2: 25 mEq/L (ref 19–32)
CREATININE: 0.75 mg/dL (ref 0.50–1.35)
Calcium: 8.9 mg/dL (ref 8.4–10.5)
Chloride: 103 mEq/L (ref 96–112)
GFR, Est African American: >89 mL/min
GLUCOSE: 206 mg/dL — AB (ref 70–99)
Potassium: 4.4 mEq/L (ref 3.5–5.3)
Sodium: 137 mEq/L (ref 135–145)
Total Bilirubin: 0.6 mg/dL (ref 0.2–1.2)
Total Protein: 7.1 g/dL (ref 6.0–8.3)

## 2014-08-03 LAB — LIPID PANEL
Cholesterol: 292 mg/dL — ABNORMAL HIGH (ref 0–200)
HDL: 30 mg/dL — ABNORMAL LOW (ref >=40)
Total CHOL/HDL Ratio: 9.7 Ratio
Triglycerides: 776 mg/dL — ABNORMAL HIGH (ref <150)

## 2014-08-03 LAB — POCT GLYCOSYLATED HEMOGLOBIN (HGB A1C): Hemoglobin A1C: 8.2

## 2014-08-03 LAB — GLUCOSE, CAPILLARY: Glucose-Capillary: 284 mg/dL — ABNORMAL HIGH (ref 70–99)

## 2014-08-03 LAB — LDL CHOLESTEROL, DIRECT: LDL DIRECT: 60 mg/dL

## 2014-08-03 MED ORDER — HYDROCODONE-ACETAMINOPHEN 5-325 MG PO TABS: 1.0000 | ORAL_TABLET | Freq: Every day | ORAL | Status: DC | PRN

## 2014-08-03 MED ORDER — METFORMIN HCL ER 500 MG PO TB24: 1500.0000 mg | ORAL_TABLET | Freq: Every day | ORAL | Status: DC

## 2014-08-03 MED ORDER — ENALAPRIL MALEATE 5 MG PO TABS: 5.0000 mg | ORAL_TABLET | Freq: Every day | ORAL | Status: DC

## 2014-08-03 NOTE — Assessment & Plan Note (Signed)
Patient received Prevnar vaccine today.  Patient requested PSA screening, which he has elected in the past; I again discussed the pros and cons today and he would like to proceed with screening.

## 2014-08-03 NOTE — Assessment & Plan Note (Signed)
Assessment: Patient recently underwent left knee arthroscopy with partial medial and lateral meniscectomies and chondroplasty of patella and medial compartments by Dr. Norlene CampbellPeter Whitfield on 07/09/2014; he reports that he has done well following the procedure.  He reports that he has reduced the hydrocodone-acetaminophen use to one tablet daily as needed for pain  Plan: Patient will follow-up with Dr. Cleophas DunkerWhitfield.

## 2014-08-03 NOTE — Patient Instructions (Addendum)
Increase metformin XR 500 mg to a dose of 3 tablets daily. Start enalapril 5 mg 1 tablet daily for high blood pressure. Please check your blood sugar once a day before breakfast, and bring your meter to each clinic visit. Please bring all of your medications to each clinic visit.

## 2014-08-03 NOTE — Assessment & Plan Note (Addendum)
Lab Results  Component Value Date   CHOL 220* 11/26/2013   TRIG 466* 11/26/2013   LDLDIRECT 57 11/27/2013   HDL 32* 11/26/2013    Assessment: Patient has a history of severe hypertriglyceridemia, but he stopped taking fenofibrate about one year ago because he thought it might be causing GI side effects.  I discussed with him the clinical indications for treating severe elevations of his triglycerides including lowering the risk of pancreatitis and also reducing the cardiovascular risk associated with elevated triglycerides.  Plan: Check a lipid panel today (patient reports that he is fasting), and depending upon results consider restarting fenofibrate with close attention to possible side effects.

## 2014-08-03 NOTE — Assessment & Plan Note (Addendum)
Lab Results  Component Value Date   HGBA1C 8.2 08/03/2014   HGBA1C 6.7 11/18/2013   HGBA1C 7.2 06/11/2013     Assessment: Diabetes control: fair control Progress toward A1C goal:  deteriorated Comments: Hemoglobin A1c is above goal on metformin XR 1,000 mg daily.  Patient acknowledges less compliance with diabetic diet.  Plan: Medications:  Increase metformin XR to a dose of 1500 mg daily.  Patient declined referral to diabetic nutritionist. Home glucose monitoring: Frequency: once a day Timing: before breakfast Instruction/counseling given: reminded to bring blood glucose meter & log to each visit, reminded to bring medications to each visit and discussed diet Other plans: Check labs including CBC, comprehensive metabolic panel, and urine microalbumin/creatinine ratio.
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
## 2014-08-03 NOTE — Assessment & Plan Note (Signed)
BP Readings from Last 3 Encounters:  08/03/14 144/77  07/09/14 177/85  02/16/14 143/78    Lab Results  Component Value Date   NA 134* 07/08/2014   K 4.0 07/08/2014   CREATININE 0.66 07/08/2014    Assessment: Blood pressure control: moderately elevated Progress toward BP goal:  unchanged Comments: Blood pressures at the last 3 visits have been mild to moderately elevated.  Patient is on no medications for his hypertension.  Plan: Medications:  Start enalapril 5 mg daily.  I discussed the potential side effects with patient. Other plans: I advised patient that he would need to have his renal function and potassium rechecked when he returns in one month.
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
## 2014-08-03 NOTE — Assessment & Plan Note (Signed)
Assessment: Symptoms appear to be well controlled on esomeprazole 40 mg daily.  Plan: Continue esomeprazole 40 mg daily.

## 2014-08-04 LAB — MICROALBUMIN / CREATININE URINE RATIO
Creatinine, Urine: 101.4 mg/dL
MICROALB/CREAT RATIO: 10.8 mg/g (ref 0.0–30.0)
Microalb, Ur: 1.1 mg/dL (ref <2.0)

## 2014-08-04 LAB — PSA: PSA: 0.66 ng/mL (ref <=4.00)

## 2014-08-05 ENCOUNTER — Encounter: Payer: PPO | Admitting: Internal Medicine

## 2014-08-12 ENCOUNTER — Telehealth: Payer: Self-pay | Admitting: Internal Medicine

## 2014-08-12 DIAGNOSIS — E781 Pure hyperglyceridemia: Secondary | ICD-10-CM

## 2014-08-12 DIAGNOSIS — G8929 Other chronic pain: Secondary | ICD-10-CM

## 2014-08-12 DIAGNOSIS — R109 Unspecified abdominal pain: Secondary | ICD-10-CM

## 2014-08-12 NOTE — Assessment & Plan Note (Signed)
Telephone Contact Note   I called patient and discussed his lipid panel results, which showed increase in his triglycerides from 466 on 11/26/2013 to the recent value of 776 on 08/03/2014.  I discussed with patient the advisability of restarting a low dose of TriCor (fenofibrate) 48 mg daily, but he does not want to restart the fenofibrate because he feels that it aggravated his chronic abdominal pain.  He also feels that he can substantially improve his diet, and reports that over the past few months he has been eating a lot of fats and sweets which he feels that he can cut out.  I don't think switching to another fibrate such as gemfibrozil would be helpful, since it would carry the same potential side effect of abdominal pain.  Nicotinic acid would carry the risk of worsening his diabetes.  Fish oil is another option, but again carries the potential for GI side effects.  Given his reluctance to try a lower dose of fenofibrate, the plan is for him to work on dietary management and follow-up in about 3 months.  Patient also requested referral to gastroenterologist Dr. Matthias HughsBuccini for his chronic abdominal pain, which has been more bothersome recently; he has seen Dr. Matthias HughsBuccini in the past.

## 2014-08-12 NOTE — Progress Notes (Signed)
Quick Note:  I called patient and discussed his lipid panel results, which showed increase in his triglycerides from 466 on 11/26/2013 to the recent value of 776 on 08/03/2014. I discussed with patient the advisability of restarting a low dose of TriCor (fenofibrate) 48 mg daily, but he does not want to restart the fenofibrate because he feels that it aggravated his chronic abdominal pain. He also feels that he can substantially improve his diet, and reports that over the past few months he has been eating a lot of fats and sweets which he feels that he can cut out. I don't think switching to another fibrate such as gemfibrozil would be helpful, since it would carry the same potential side effect of abdominal pain. Nicotinic acid would carry the risk of worsening his diabetes. Fish oil is another option, but again carries the potential for GI side effects. Given his reluctance to try a lower dose of fenofibrate, the plan is for him to work on dietary management and follow-up in about 3 months. Patient also requested referral to gastroenterologist Dr. Matthias HughsBuccini for his chronic abdominal pain, which has been more bothersome recently; he has seen Dr. Matthias HughsBuccini in the past. ______

## 2014-08-12 NOTE — Telephone Encounter (Signed)
I called patient and discussed his lipid panel results, which showed increase in his triglycerides from 466 on 11/26/2013 to the recent value of 776 on 08/03/2014.  I discussed with patient the advisability of restarting a low dose of TriCor (fenofibrate) 48 mg daily, but he does not want to restart the fenofibrate because he feels that it aggravated his chronic abdominal pain.  He also feels that he can substantially improve his diet, and reports that over the past few months he has been eating a lot of fats and sweets which he feels that he can cut out.  I don't think switching to another fibrate such as gemfibrozil would be helpful, since it would carry the same potential side effect of abdominal pain.  Nicotinic acid would carry the risk of worsening his diabetes.  Fish oil is another option, but again carries the potential for GI side effects.  Given his reluctance to try a lower dose of fenofibrate, the plan is for him to work on dietary management and follow-up in about 3 months.  Patient also requested referral to gastroenterologist Dr. Matthias HughsBuccini for his chronic abdominal pain, which has been more bothersome recently; he has seen Dr. Matthias HughsBuccini in the past.

## 2014-12-11 ENCOUNTER — Emergency Department (HOSPITAL_BASED_OUTPATIENT_CLINIC_OR_DEPARTMENT_OTHER)
Admission: EM | Admit: 2014-12-11 | Discharge: 2014-12-11 | Disposition: A | Discharge status: Home / Self Care | Payer: PPO | Attending: Emergency Medicine | Admitting: Emergency Medicine

## 2014-12-11 ENCOUNTER — Encounter (HOSPITAL_BASED_OUTPATIENT_CLINIC_OR_DEPARTMENT_OTHER): Payer: Self-pay | Admitting: Emergency Medicine

## 2014-12-11 DIAGNOSIS — K219 Gastro-esophageal reflux disease without esophagitis: Secondary | ICD-10-CM | POA: Insufficient documentation

## 2014-12-11 DIAGNOSIS — Z8669 Personal history of other diseases of the nervous system and sense organs: Secondary | ICD-10-CM | POA: Insufficient documentation

## 2014-12-11 DIAGNOSIS — R300 Dysuria: Secondary | ICD-10-CM | POA: Diagnosis not present

## 2014-12-11 DIAGNOSIS — Z7982 Long term (current) use of aspirin: Secondary | ICD-10-CM | POA: Insufficient documentation

## 2014-12-11 DIAGNOSIS — S39012A Strain of muscle, fascia and tendon of lower back, initial encounter: Secondary | ICD-10-CM | POA: Insufficient documentation

## 2014-12-11 DIAGNOSIS — M1990 Unspecified osteoarthritis, unspecified site: Secondary | ICD-10-CM | POA: Insufficient documentation

## 2014-12-11 DIAGNOSIS — E119 Type 2 diabetes mellitus without complications: Secondary | ICD-10-CM | POA: Diagnosis not present

## 2014-12-11 DIAGNOSIS — Z87438 Personal history of other diseases of male genital organs: Secondary | ICD-10-CM | POA: Diagnosis not present

## 2014-12-11 DIAGNOSIS — X58XXXA Exposure to other specified factors, initial encounter: Secondary | ICD-10-CM | POA: Insufficient documentation

## 2014-12-11 DIAGNOSIS — Z79899 Other long term (current) drug therapy: Secondary | ICD-10-CM | POA: Insufficient documentation

## 2014-12-11 DIAGNOSIS — Y998 Other external cause status: Secondary | ICD-10-CM | POA: Diagnosis not present

## 2014-12-11 DIAGNOSIS — S3992XA Unspecified injury of lower back, initial encounter: Secondary | ICD-10-CM | POA: Diagnosis present

## 2014-12-11 DIAGNOSIS — Y9389 Activity, other specified: Secondary | ICD-10-CM | POA: Insufficient documentation

## 2014-12-11 DIAGNOSIS — Y92007 Garden or yard of unspecified non-institutional (private) residence as the place of occurrence of the external cause: Secondary | ICD-10-CM | POA: Diagnosis not present

## 2014-12-11 DIAGNOSIS — Z87891 Personal history of nicotine dependence: Secondary | ICD-10-CM | POA: Diagnosis not present

## 2014-12-11 DIAGNOSIS — G8929 Other chronic pain: Secondary | ICD-10-CM | POA: Insufficient documentation

## 2014-12-11 LAB — URINALYSIS, ROUTINE W REFLEX MICROSCOPIC
Bilirubin Urine: NEGATIVE
Hgb urine dipstick: NEGATIVE
KETONES UR: NEGATIVE mg/dL
LEUKOCYTES UA: NEGATIVE
Nitrite: NEGATIVE
PH: 5.5 (ref 5.0–8.0)
PROTEIN: NEGATIVE mg/dL
SPECIFIC GRAVITY, URINE: 1.036 — AB (ref 1.005–1.030)
UROBILINOGEN UA: 1 mg/dL (ref 0.0–1.0)

## 2014-12-11 LAB — URINE MICROSCOPIC-ADD ON

## 2014-12-11 MED ORDER — CYCLOBENZAPRINE HCL 10 MG PO TABS: 10.0000 mg | ORAL_TABLET | Freq: Two times a day (BID) | ORAL | Status: DC | PRN

## 2014-12-11 MED ORDER — NAPROXEN 500 MG PO TABS: 500.0000 mg | ORAL_TABLET | Freq: Two times a day (BID) | ORAL | Status: DC

## 2014-12-11 MED ORDER — KETOROLAC TROMETHAMINE 60 MG/2ML IM SOLN
60.0000 mg | Freq: Once | INTRAMUSCULAR | Status: AC
  Administered 2014-12-11: 60 mg via INTRAMUSCULAR
  Filled 2014-12-11: qty 2

## 2014-12-11 MED ORDER — CYCLOBENZAPRINE HCL 10 MG PO TABS
10.0000 mg | ORAL_TABLET | Freq: Once | ORAL | Status: AC
  Administered 2014-12-11: 10 mg via ORAL
  Filled 2014-12-11: qty 1

## 2014-12-11 MED ORDER — HYDROMORPHONE HCL 1 MG/ML IJ SOLN: 0.5000 mg | Freq: Once | INTRAMUSCULAR | Status: DC

## 2014-12-11 MED ORDER — HYDROMORPHONE HCL 1 MG/ML IJ SOLN
0.5000 mg | Freq: Once | INTRAMUSCULAR | Status: AC
  Administered 2014-12-11: 0.5 mg via INTRAMUSCULAR
  Filled 2014-12-11: qty 1
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
## 2014-12-11 NOTE — ED Notes (Signed)
Right sided low back pain since yesterday.   Chronic but has not been a problem in 3 years.  Played golf this week and worked in the yard yesterday. Has orthopedic but cannot get in until next week.

## 2014-12-11 NOTE — Discharge Instructions (Signed)

## 2014-12-13 LAB — URINE CULTURE: Culture: 1000

## 2015-01-06 ENCOUNTER — Other Ambulatory Visit: Payer: Self-pay | Admitting: Internal Medicine

## 2015-01-14 ENCOUNTER — Encounter: Payer: Self-pay | Admitting: Internal Medicine

## 2015-01-14 ENCOUNTER — Emergency Department (HOSPITAL_BASED_OUTPATIENT_CLINIC_OR_DEPARTMENT_OTHER)
Admission: EM | Admit: 2015-01-14 | Discharge: 2015-01-15 | Disposition: A | Discharge status: Home / Self Care | Payer: PPO | Attending: Emergency Medicine | Admitting: Emergency Medicine

## 2015-01-14 DIAGNOSIS — R51 Headache: Secondary | ICD-10-CM

## 2015-01-14 DIAGNOSIS — I700 Atherosclerosis of aorta: Secondary | ICD-10-CM | POA: Insufficient documentation

## 2015-01-14 DIAGNOSIS — Z87891 Personal history of nicotine dependence: Secondary | ICD-10-CM | POA: Insufficient documentation

## 2015-01-14 DIAGNOSIS — Z79899 Other long term (current) drug therapy: Secondary | ICD-10-CM | POA: Diagnosis not present

## 2015-01-14 DIAGNOSIS — J0110 Acute frontal sinusitis, unspecified: Secondary | ICD-10-CM

## 2015-01-14 DIAGNOSIS — G8929 Other chronic pain: Secondary | ICD-10-CM | POA: Diagnosis not present

## 2015-01-14 DIAGNOSIS — E119 Type 2 diabetes mellitus without complications: Secondary | ICD-10-CM | POA: Insufficient documentation

## 2015-01-14 DIAGNOSIS — Z87828 Personal history of other (healed) physical injury and trauma: Secondary | ICD-10-CM | POA: Diagnosis not present

## 2015-01-14 DIAGNOSIS — K219 Gastro-esophageal reflux disease without esophagitis: Secondary | ICD-10-CM | POA: Diagnosis not present

## 2015-01-14 DIAGNOSIS — H538 Other visual disturbances: Secondary | ICD-10-CM | POA: Diagnosis not present

## 2015-01-14 DIAGNOSIS — Z8639 Personal history of other endocrine, nutritional and metabolic disease: Secondary | ICD-10-CM | POA: Insufficient documentation

## 2015-01-14 DIAGNOSIS — Z87438 Personal history of other diseases of male genital organs: Secondary | ICD-10-CM | POA: Diagnosis not present

## 2015-01-14 DIAGNOSIS — Z8679 Personal history of other diseases of the circulatory system: Secondary | ICD-10-CM | POA: Insufficient documentation

## 2015-01-14 DIAGNOSIS — M1990 Unspecified osteoarthritis, unspecified site: Secondary | ICD-10-CM | POA: Insufficient documentation

## 2015-01-14 DIAGNOSIS — Z7982 Long term (current) use of aspirin: Secondary | ICD-10-CM | POA: Insufficient documentation

## 2015-01-14 DIAGNOSIS — R519 Headache, unspecified: Secondary | ICD-10-CM

## 2015-01-14 DIAGNOSIS — Z791 Long term (current) use of non-steroidal anti-inflammatories (NSAID): Secondary | ICD-10-CM | POA: Insufficient documentation

## 2015-01-14 HISTORY — DX: Atherosclerosis of aorta: I70.0

## 2015-01-14 NOTE — ED Notes (Signed)
Headache x 3 days 

## 2015-01-14 NOTE — ED Notes (Signed)
C/o ha x 3 days  Worse at 5:00 this pm, some blurred vision, eyes swollen, watering  Nasal drainage,  Denies dizziness, no n/v

## 2015-01-15 ENCOUNTER — Emergency Department (HOSPITAL_BASED_OUTPATIENT_CLINIC_OR_DEPARTMENT_OTHER): Payer: PPO

## 2015-01-15 LAB — CBC WITH DIFFERENTIAL/PLATELET
BASOS ABS: 0 10*3/uL (ref 0.0–0.1)
Basophils Relative: 0 % (ref 0–1)
EOS PCT: 1 % (ref 0–5)
Eosinophils Absolute: 0.1 10*3/uL (ref 0.0–0.7)
HEMATOCRIT: 41 % (ref 39.0–52.0)
Hemoglobin: 13.7 g/dL (ref 13.0–17.0)
LYMPHS ABS: 3.2 10*3/uL (ref 0.7–4.0)
LYMPHS PCT: 34 % (ref 12–46)
MCH: 28.2 pg (ref 26.0–34.0)
MCHC: 33.4 g/dL (ref 30.0–36.0)
MCV: 84.4 fL (ref 78.0–100.0)
MONO ABS: 1 10*3/uL (ref 0.1–1.0)
MONOS PCT: 11 % (ref 3–12)
NEUTROS ABS: 5.1 10*3/uL (ref 1.7–7.7)
Neutrophils Relative %: 54 % (ref 43–77)
PLATELETS: 245 10*3/uL (ref 150–400)
RBC: 4.86 MIL/uL (ref 4.22–5.81)
RDW: 13.2 % (ref 11.5–15.5)
WBC: 9.4 10*3/uL (ref 4.0–10.5)

## 2015-01-15 LAB — BASIC METABOLIC PANEL
ANION GAP: 10 (ref 5–15)
BUN: 9 mg/dL (ref 6–20)
CHLORIDE: 103 mmol/L (ref 101–111)
CO2: 23 mmol/L (ref 22–32)
Calcium: 8.7 mg/dL — ABNORMAL LOW (ref 8.9–10.3)
Creatinine, Ser: 0.78 mg/dL (ref 0.61–1.24)
GFR calc Af Amer: >60 mL/min (ref >60)
GLUCOSE: 177 mg/dL — AB (ref 65–99)
POTASSIUM: 3.8 mmol/L (ref 3.5–5.1)
Sodium: 136 mmol/L (ref 135–145)

## 2015-01-15 MED ORDER — AZITHROMYCIN 250 MG PO TABS: ORAL_TABLET | ORAL | Status: DC

## 2015-01-15 MED ORDER — HYDROCODONE-ACETAMINOPHEN 5-325 MG PO TABS: 1.0000 | ORAL_TABLET | Freq: Four times a day (QID) | ORAL | Status: DC | PRN

## 2015-01-15 MED ORDER — MORPHINE SULFATE (PF) 4 MG/ML IV SOLN
4.0000 mg | Freq: Once | INTRAVENOUS | Status: AC
  Administered 2015-01-15: 4 mg via INTRAVENOUS
  Filled 2015-01-15: qty 1

## 2015-01-15 MED ORDER — SODIUM CHLORIDE 0.9 % IV BOLUS (SEPSIS)
1000.0000 mL | Freq: Once | INTRAVENOUS | Status: AC
  Administered 2015-01-15: 1000 mL via INTRAVENOUS

## 2015-01-15 NOTE — Discharge Instructions (Signed)
Zithromax as prescribed.  Ibuprofen 600 mg every 6 hours as needed for pain.  Hydrocodone as prescribed as needed for pain not relieved with ibuprofen.  Follow-up with your primary Dr. if not improving in the next week.   Sinus Headache A sinus headache is when your sinuses become clogged or swollen. Sinus headaches can range from mild to severe.  CAUSES A sinus headache can have different causes, such as:  Colds.  Sinus infections.  Allergies. SYMPTOMS  Symptoms of a sinus headache may vary and can include:  Headache.  Pain or pressure in the face.  Congested or runny nose.  Fever.  Inability to smell.  Pain in upper teeth. Weather changes can make symptoms worse. TREATMENT  The treatment of a sinus headache depends on the cause.  Sinus pain caused by a sinus infection may be treated with antibiotic medicine.  Sinus pain caused by allergies may be helped by allergy medicines (antihistamines) and medicated nasal sprays.  Sinus pain caused by congestion may be helped by flushing the nose and sinuses with saline solution. HOME CARE INSTRUCTIONS   If antibiotics are prescribed, take them as directed. Finish them even if you start to feel better.  Only take over-the-counter or prescription medicines for pain, discomfort, or fever as directed by your caregiver.  If you have congestion, use a nasal spray to help reduce pressure. SEEK IMMEDIATE MEDICAL CARE IF:  You have a fever.  You have headaches more than once a week.  You have sensitivity to light or sound.  You have repeated nausea and vomiting.  You have vision problems.  You have sudden, severe pain in your face or head.  You have a seizure.  You are confused.  Your sinus headaches do not get better after treatment. Many people think they have a sinus headache when they actually have migraines or tension headaches. MAKE SURE YOU:   Understand these instructions.  Will watch your  condition.  Will get help right away if you are not doing well or get worse. Document Released: 06/01/2004 Document Revised: 07/17/2011 Document Reviewed: 07/23/2010 [facility] Patient Information 2015 [place], [place]. This information is not intended to replace advice given to you by your health care provider. Make sure you discuss any questions you have with your health care provider.
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
## 2015-01-17 ENCOUNTER — Emergency Department (HOSPITAL_COMMUNITY)
Admission: EM | Admit: 2015-01-17 | Discharge: 2015-01-17 | Disposition: A | Discharge status: Home / Self Care | Payer: PPO | Attending: Emergency Medicine | Admitting: Emergency Medicine

## 2015-01-17 ENCOUNTER — Emergency Department (HOSPITAL_COMMUNITY): Payer: PPO

## 2015-01-17 ENCOUNTER — Encounter (HOSPITAL_COMMUNITY): Payer: Self-pay | Admitting: Radiology

## 2015-01-17 DIAGNOSIS — Z79899 Other long term (current) drug therapy: Secondary | ICD-10-CM | POA: Diagnosis not present

## 2015-01-17 DIAGNOSIS — Z87891 Personal history of nicotine dependence: Secondary | ICD-10-CM | POA: Diagnosis not present

## 2015-01-17 DIAGNOSIS — Z7982 Long term (current) use of aspirin: Secondary | ICD-10-CM | POA: Diagnosis not present

## 2015-01-17 DIAGNOSIS — J0131 Acute recurrent sphenoidal sinusitis: Secondary | ICD-10-CM

## 2015-01-17 DIAGNOSIS — K219 Gastro-esophageal reflux disease without esophagitis: Secondary | ICD-10-CM | POA: Diagnosis not present

## 2015-01-17 DIAGNOSIS — R51 Headache: Secondary | ICD-10-CM | POA: Diagnosis present

## 2015-01-17 DIAGNOSIS — E119 Type 2 diabetes mellitus without complications: Secondary | ICD-10-CM | POA: Diagnosis not present

## 2015-01-17 DIAGNOSIS — G8929 Other chronic pain: Secondary | ICD-10-CM | POA: Insufficient documentation

## 2015-01-17 LAB — COMPREHENSIVE METABOLIC PANEL
ALBUMIN: 3.5 g/dL (ref 3.5–5.0)
ALK PHOS: 87 U/L (ref 38–126)
ALT: 16 U/L — AB (ref 17–63)
AST: 36 U/L (ref 15–41)
Anion gap: 10 (ref 5–15)
BILIRUBIN TOTAL: 1.7 mg/dL — AB (ref 0.3–1.2)
BUN: 8 mg/dL (ref 6–20)
CALCIUM: 8.7 mg/dL — AB (ref 8.9–10.3)
CO2: 22 mmol/L (ref 22–32)
Chloride: 103 mmol/L (ref 101–111)
Creatinine, Ser: 0.66 mg/dL (ref 0.61–1.24)
GFR calc Af Amer: >60 mL/min (ref >60)
GFR calc non Af Amer: >60 mL/min (ref >60)
GLUCOSE: 157 mg/dL — AB (ref 65–99)
POTASSIUM: 4.8 mmol/L (ref 3.5–5.1)
Sodium: 135 mmol/L (ref 135–145)
TOTAL PROTEIN: 7.1 g/dL (ref 6.5–8.1)

## 2015-01-17 LAB — CBC WITH DIFFERENTIAL/PLATELET
BASOS ABS: 0 10*3/uL (ref 0.0–0.1)
BASOS PCT: 0 % (ref 0–1)
Eosinophils Absolute: 0 10*3/uL (ref 0.0–0.7)
Eosinophils Relative: 0 % (ref 0–5)
HEMATOCRIT: 40.6 % (ref 39.0–52.0)
HEMOGLOBIN: 14.2 g/dL (ref 13.0–17.0)
Lymphocytes Relative: 24 % (ref 12–46)
Lymphs Abs: 2.5 10*3/uL (ref 0.7–4.0)
MCH: 29.8 pg (ref 26.0–34.0)
MCHC: 35 g/dL (ref 30.0–36.0)
MCV: 85.3 fL (ref 78.0–100.0)
Monocytes Absolute: 0.9 10*3/uL (ref 0.1–1.0)
Monocytes Relative: 9 % (ref 3–12)
NEUTROS ABS: 7 10*3/uL (ref 1.7–7.7)
NEUTROS PCT: 67 % (ref 43–77)
Platelets: 263 10*3/uL (ref 150–400)
RBC: 4.76 MIL/uL (ref 4.22–5.81)
RDW: 13.2 % (ref 11.5–15.5)
WBC: 10.5 10*3/uL (ref 4.0–10.5)

## 2015-01-17 MED ORDER — SODIUM CHLORIDE 0.9 % IV BOLUS (SEPSIS)
1000.0000 mL | Freq: Once | INTRAVENOUS | Status: AC
  Administered 2015-01-17: 1000 mL via INTRAVENOUS

## 2015-01-17 MED ORDER — AMOXICILLIN-POT CLAVULANATE 875-125 MG PO TABS: 1.0000 | ORAL_TABLET | Freq: Two times a day (BID) | ORAL | Status: AC

## 2015-01-17 MED ORDER — AMOXICILLIN-POT CLAVULANATE 875-125 MG PO TABS: 1.0000 | ORAL_TABLET | Freq: Two times a day (BID) | ORAL | Status: DC

## 2015-01-17 MED ORDER — HYDROCODONE-ACETAMINOPHEN 5-325 MG PO TABS: 2.0000 | ORAL_TABLET | ORAL | Status: DC | PRN

## 2015-01-17 MED ORDER — NAPROXEN 500 MG PO TABS: 500.0000 mg | ORAL_TABLET | Freq: Two times a day (BID) | ORAL | Status: DC

## 2015-01-17 MED ORDER — HYDROMORPHONE HCL 1 MG/ML IJ SOLN
1.0000 mg | Freq: Once | INTRAMUSCULAR | Status: AC
  Administered 2015-01-17: 1 mg via INTRAVENOUS
  Filled 2015-01-17: qty 1

## 2015-01-17 MED ORDER — IOHEXOL 300 MG/ML  SOLN
75.0000 mL | Freq: Once | INTRAMUSCULAR | Status: AC | PRN
  Administered 2015-01-17: 75 mL via INTRAVENOUS
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
## 2015-01-17 NOTE — ED Notes (Signed)
Pt transported to CT ?

## 2015-01-17 NOTE — ED Notes (Signed)
Phlebotomy called, unable to draw blood

## 2015-01-17 NOTE — ED Notes (Signed)
MD at bedside. 

## 2015-01-17 NOTE — ED Notes (Signed)
Pt and wife reports onset 01-14-15, was seen at [facility] and dx with sinus infection, prescribed z pack and pain med.  Wife took pt to u/c yesterday because pain across forehead and behind eyes was severe, was given shot of toradol and pain meds changed and given prednisone.  Pt reports pain is unbearable.

## 2015-01-17 NOTE — ED Notes (Signed)
CT called to notify that IV is in place.  \

## 2015-01-17 NOTE — Discharge Instructions (Signed)

## 2015-02-19 ENCOUNTER — Encounter: Payer: Self-pay | Admitting: Internal Medicine

## 2015-02-19 ENCOUNTER — Ambulatory Visit (INDEPENDENT_AMBULATORY_CARE_PROVIDER_SITE_OTHER): Payer: PPO | Admitting: Internal Medicine

## 2015-02-19 VITALS — BP 147/77 | HR 64 | Temp 97.7°F | Wt 211.2 lb

## 2015-02-19 DIAGNOSIS — Z23 Encounter for immunization: Secondary | ICD-10-CM | POA: Diagnosis not present

## 2015-02-19 DIAGNOSIS — Z7984 Long term (current) use of oral hypoglycemic drugs: Secondary | ICD-10-CM

## 2015-02-19 DIAGNOSIS — N521 Erectile dysfunction due to diseases classified elsewhere: Secondary | ICD-10-CM | POA: Diagnosis not present

## 2015-02-19 DIAGNOSIS — E669 Obesity, unspecified: Secondary | ICD-10-CM

## 2015-02-19 DIAGNOSIS — I10 Essential (primary) hypertension: Secondary | ICD-10-CM

## 2015-02-19 DIAGNOSIS — E781 Pure hyperglyceridemia: Secondary | ICD-10-CM

## 2015-02-19 DIAGNOSIS — M1712 Unilateral primary osteoarthritis, left knee: Secondary | ICD-10-CM

## 2015-02-19 DIAGNOSIS — E1140 Type 2 diabetes mellitus with diabetic neuropathy, unspecified: Secondary | ICD-10-CM | POA: Diagnosis not present

## 2015-02-19 DIAGNOSIS — E66811 Obesity, class 1: Secondary | ICD-10-CM

## 2015-02-19 DIAGNOSIS — E1169 Type 2 diabetes mellitus with other specified complication: Secondary | ICD-10-CM

## 2015-02-19 DIAGNOSIS — R1032 Left lower quadrant pain: Secondary | ICD-10-CM

## 2015-02-19 DIAGNOSIS — Z0000 Encounter for general adult medical examination without abnormal findings: Secondary | ICD-10-CM

## 2015-02-19 DIAGNOSIS — I700 Atherosclerosis of aorta: Secondary | ICD-10-CM

## 2015-02-19 DIAGNOSIS — K219 Gastro-esophageal reflux disease without esophagitis: Secondary | ICD-10-CM

## 2015-02-19 HISTORY — DX: Obesity, unspecified: E66.9

## 2015-02-19 HISTORY — DX: Obesity, class 1: E66.811

## 2015-02-19 LAB — GLUCOSE, CAPILLARY: GLUCOSE-CAPILLARY: 252 mg/dL — AB (ref 65–99)

## 2015-02-19 LAB — POCT GLYCOSYLATED HEMOGLOBIN (HGB A1C): HEMOGLOBIN A1C: 7

## 2015-02-19 NOTE — Assessment & Plan Note (Signed)
He currently does not have any claudication symptoms associated with his aortic atherosclerosis. We are continuing to work on risk factor modification, specifically treating his diabetes aggressively with metformin and trying to make some dietary changes to improve his hypertension. We will reassess for symptoms of claudication secondary to aortic atherosclerosis at the follow-up visit.

## 2015-02-19 NOTE — Assessment & Plan Note (Signed)
At the last visit with Dr. Meredith PelJoines he was noted to have an elevated triglyceride level in the 400s off of any fibrate therapy. Dr. Meredith PelJoines tried to get him to restart fibrates but he was not interested as he felt this worsened his chronic abdominal pain. As his triglycerides were not at a level that would be concerning for pancreatitis and other significant morbidity I agree with his desire not to restart a fibrate if it may exacerbate his abdominal pain and decrease his quality of life.

## 2015-02-19 NOTE — Assessment & Plan Note (Signed)
We had a long discussion about diet, especially chocolate and other snacks. He is going to make an effort to cut this out of his diet and we will see if this helps to decrease his weight. With improvement in his weight we should also get improvement in his diabetes, hypertension, and hypertriglyceridemia. We will reassess his weight at the follow-up visit.

## 2015-02-19 NOTE — Assessment & Plan Note (Signed)
He has had left inguinal pain since surgery for a hernia in December 2013. This pain is very mild and only noticeable when he is watching TV or does not have his mind on other activities. The pain is described as a burning sensation that does not radiate and has not changed in character recently. Examination was completely unremarkable without evidence of erythema, bulging, or extrusion of mesh. Given the mild symptoms we decided to watch this.  He is aware that he should contact me should his symptomatology change.

## 2015-02-19 NOTE — Progress Notes (Signed)
   Subjective:    Patient ID: Tyler Bridges, male    DOB: 07-22-1945, 69 y.o.   MRN: [id number]  HPI  Tyler Bridges is here for follow-up of his diabetes, knee osteoarthritis, and hypertension. Please see the A&P for the status of the pt's chronic medical problems.  He was previously followed by Dr. Meredith PelJoines and was transferred to my panel upon Dr. Meredith PelJoines' retirement. This is the first time I am meeting with Tyler Bridges.  Review of Systems  Constitutional: Negative for activity change, appetite change and unexpected weight change.  Genitourinary: Positive for dysuria, decreased urine volume and difficulty urinating.       Dysuria and decreased stream are intermittent and not currently present.  Musculoskeletal: Positive for arthralgias. Negative for myalgias and joint swelling.  Skin: Negative for color change, rash and wound.  Neurological: Negative for weakness.  Psychiatric/Behavioral: Negative for dysphoric mood. The patient is not nervous/anxious.       Objective:   Physical Exam  Constitutional: He is oriented to person, place, and time. He appears well-developed and well-nourished. No distress.  HENT:  Head: Normocephalic and atraumatic.  Eyes: Conjunctivae are normal. Right eye exhibits no discharge. Left eye exhibits no discharge. No scleral icterus.  Musculoskeletal: Normal range of motion. He exhibits no edema or tenderness.  Neurological: He is alert and oriented to person, place, and time. He exhibits normal muscle tone.  Skin: Skin is warm and dry. No rash noted. He is not diaphoretic. No erythema.  Psychiatric: He has a normal mood and affect. His behavior is normal. Judgment and thought content normal.  Nursing note and vitals reviewed.     Assessment & Plan:   Please see problem oriented charting.

## 2015-02-19 NOTE — Assessment & Plan Note (Signed)
His blood pressure today was mildly elevated at 147/77. At the last clinic visit Dr. Meredith PelJoines started enalapril 5 mg by mouth daily although he apparently never picked this medication up. We discussed the importance of salt restriction and weight loss. It is hoped with a concerted effort to decrease the salt in his diet as well as decrease in chocolate consumption, and thus weight, we may be able to reach the target blood pressure of less than 140/80. If non-pharmacologic therapy is ineffective we will reconsider starting an ACE inhibitor at the follow-up visit.

## 2015-02-19 NOTE — Assessment & Plan Note (Signed)
His gastroesophageal reflux symptoms are controlled on the esomeprazole 40 mg by mouth daily. We will therefore continue this therapy and reassess his symptoms at the follow-up visit.

## 2015-02-19 NOTE — Assessment & Plan Note (Signed)
He continues to note some pain in the left knee. He stated he was seen by an orthopedic surgeon and was educated that some chronic pain would persist regardless of what was done. He is considering Synvisc injections via the orthopedic surgery clinic. He is not interested in any pain medication for his knee osteoarthritis at this time. We will reassess his pain control at the follow-up visit, especially if he has since had Synvisc therapy.

## 2015-02-19 NOTE — Patient Instructions (Addendum)
It was nice to meet you.  I look forward to taking care of you for years to come.  1) Keep taking the medications as you are.  2) Cut back on the salt and chocolate as much as possible.  This will help your blood pressure and diabetes.  3) We gave you the flu shot today.  I will see you back in 3 months, sooner if necessary.  I will schedule you in the last slot of the morning so that I can spend more time learning about your past medical history.

## 2015-02-19 NOTE — Assessment & Plan Note (Signed)
His hemoglobin A1c today was 7.0. This is at target. He is currently taking metformin XR 1500 mg by mouth daily. He is tolerating this medication well. We discussed diet and foods to avoid, especially sweets. He is going to make a concerted effort to cut chocolate out of any snacks. It is hoped this will not only improve his glycemic control but may also improve his weight. A diabetic foot exam was performed today. At the follow-up visit we will discuss referral for a diabetic retinal examination as well as obtaining urine for microalbumin. We did not discuss his erectile dysfunction other than the fact that he is not very sexually active at this stage of his life. I will specifically address whether or not he wants pharmacologic intervention for his erectile dysfunction at the follow-up visit. We will also reassess glycemic control with a repeat hemoglobin A1c at that time.

## 2015-02-19 NOTE — Assessment & Plan Note (Signed)
He received the flu vaccination today. We will discuss hepatitis C screening at the follow-up visit. Otherwise, other than the routine diabetic health care maintenance, he is up-to-date on his preventative health care.

## 2015-04-09 ENCOUNTER — Ambulatory Visit (INDEPENDENT_AMBULATORY_CARE_PROVIDER_SITE_OTHER): Payer: PPO | Admitting: Internal Medicine

## 2015-04-09 ENCOUNTER — Encounter: Payer: Self-pay | Admitting: Internal Medicine

## 2015-04-09 VITALS — BP 158/74 | HR 71 | Temp 97.7°F | Wt 215.6 lb

## 2015-04-09 DIAGNOSIS — Z7984 Long term (current) use of oral hypoglycemic drugs: Secondary | ICD-10-CM | POA: Diagnosis not present

## 2015-04-09 DIAGNOSIS — M1712 Unilateral primary osteoarthritis, left knee: Secondary | ICD-10-CM

## 2015-04-09 DIAGNOSIS — R109 Unspecified abdominal pain: Secondary | ICD-10-CM

## 2015-04-09 DIAGNOSIS — I700 Atherosclerosis of aorta: Secondary | ICD-10-CM

## 2015-04-09 DIAGNOSIS — N521 Erectile dysfunction due to diseases classified elsewhere: Secondary | ICD-10-CM | POA: Diagnosis not present

## 2015-04-09 DIAGNOSIS — F5104 Psychophysiologic insomnia: Secondary | ICD-10-CM | POA: Insufficient documentation

## 2015-04-09 DIAGNOSIS — E1149 Type 2 diabetes mellitus with other diabetic neurological complication: Secondary | ICD-10-CM | POA: Diagnosis not present

## 2015-04-09 DIAGNOSIS — R351 Nocturia: Secondary | ICD-10-CM

## 2015-04-09 DIAGNOSIS — R1032 Left lower quadrant pain: Secondary | ICD-10-CM

## 2015-04-09 DIAGNOSIS — G8929 Other chronic pain: Secondary | ICD-10-CM

## 2015-04-09 DIAGNOSIS — I10 Essential (primary) hypertension: Secondary | ICD-10-CM

## 2015-04-09 DIAGNOSIS — J302 Other seasonal allergic rhinitis: Secondary | ICD-10-CM

## 2015-04-09 DIAGNOSIS — E1140 Type 2 diabetes mellitus with diabetic neuropathy, unspecified: Principal | ICD-10-CM

## 2015-04-09 DIAGNOSIS — Z1159 Encounter for screening for other viral diseases: Secondary | ICD-10-CM

## 2015-04-09 DIAGNOSIS — K219 Gastro-esophageal reflux disease without esophagitis: Secondary | ICD-10-CM

## 2015-04-09 DIAGNOSIS — M545 Low back pain: Secondary | ICD-10-CM

## 2015-04-09 DIAGNOSIS — N401 Enlarged prostate with lower urinary tract symptoms: Secondary | ICD-10-CM

## 2015-04-09 HISTORY — DX: Other seasonal allergic rhinitis: J30.2

## 2015-04-09 HISTORY — DX: Psychophysiologic insomnia: F51.04

## 2015-04-09 MED ORDER — LISINOPRIL 10 MG PO TABS: 10.0000 mg | ORAL_TABLET | Freq: Every day | ORAL | Status: DC

## 2015-04-09 NOTE — Assessment & Plan Note (Signed)
He is due for hepatitis C screen. This test was ordered and obtained today. It is pending at the time of this dictation. Other than a diabetic retinal exam he is up-to-date on his health care maintenance.

## 2015-04-09 NOTE — Assessment & Plan Note (Signed)
Assessment  His seasonal allergic rhinitis is currently stable at this time on no therapy.  Plan  We will reassess his symptoms at the follow-up visit and consider the initiation of nasal steroids versus antihistamines at the follow-up visit should they be required

## 2015-04-09 NOTE — Assessment & Plan Note (Signed)
Assessment  He was not due for a hemoglobin A1c today so it was not drawn. At the last visit his hemoglobin A1c was 7.0 which was at target. This was on metformin XR 1500 daily. He continues to have problems with his erectile dysfunction. That being said, he is not interested in any therapy at this time.  Plan  We will continue the metformin XR at 1500 mg by mouth daily and reassess diabetic control at the follow-up visit with a hemoglobin A1c. He is otherwise up-to-date on his diabetic health care maintenance with the exception of a diabetic retinal exam. He was willing to undergo retinal photography today within the clinic but we were unable to find the personnel to perform this test. We will therefore do retinal photography at the follow-up visit.

## 2015-04-09 NOTE — Patient Instructions (Addendum)
It was good to see you again.  I am sorry I could not figure out why you passed out.  1) I checked some labs (blood and urine).  I will call you with any concerns.  2) Start lisinopril 10 mg by mouth daily.  3) You can take up to 800 mg of Advil three times a day as needed for arthritis pain.  4) Keep taking the other medications as you are.  I will see you in 3 months, sooner if necessary.

## 2015-04-09 NOTE — Assessment & Plan Note (Signed)
Assessment  At this visit his right knee pain is worse than his left knee pain. These is due to his underlying severe osteoarthritis. He is not interested in Synvisc therapy as we had discussed at the prior visit. But, given the severity of his symptoms, he is interested in considering anti-inflammatories. Interestingly, in the past he has responded to Advil but not ibuprofen.  Plan  He was asked to purchase over-the-counter Advil and take up to 800 mg by mouth every 8 hours as needed for pain. We decided to go this route rather than prescribed the ibuprofen since it appears that he has not responded to the generic ibuprofen in the past. We will reassess the control of his arthritic pain at the follow-up visit.

## 2015-04-09 NOTE — Assessment & Plan Note (Signed)
Assessment  His chronic low back pain is stable symptomatically.  Plan  I suspect he may benefit from the Advil that is being started for his knee osteoarthritis. We will assess the effects of the Advil on his chronic low back pain at the follow-up visit.

## 2015-04-09 NOTE — Assessment & Plan Note (Signed)
Assessment  His chronic abdominal pain is unchanged from baseline. He has had no signs or symptoms that would suggest he is having small bowel obstruction from adhesions.  Plan  We will continue to follow his chronic abdominal pain expectantly.

## 2015-04-09 NOTE — Assessment & Plan Note (Signed)
Assessment  His reflux symptoms are well controlled on the Nexium 40 mg by mouth daily.  Plan  We will continue the Nexium at 40 mg by mouth daily and reassess for evidence of symptomatic reflux at the follow-up visit.

## 2015-04-09 NOTE — Assessment & Plan Note (Signed)
Assessment  His blood pressure today was 158/74. This is on no medication as he did not pick up the enalapril prescription that Dr. Meredith PelJoines previously wrote. We discussed the importance of blood pressure control given his underlying diabetes.  Plan  We will start lisinopril 10 mg by mouth daily and reassess his blood pressure at the follow-up visit. We will also obtain a basic metabolic panel at that time to assure his renal function and potassium levels remain stable.

## 2015-04-09 NOTE — Assessment & Plan Note (Signed)
Assessment  His aortic atherosclerosis remains asymptomatic.  Plan  We will start lisinopril 10 mg by mouth daily given his hypertension. As this is a significant risk factor for progression of his atherosclerosis we feel this is a necessary intervention at this time. We will also continue to aggressively manage his other major risk factor his diabetes.

## 2015-04-09 NOTE — Assessment & Plan Note (Signed)
Assessment  His left inguinal pain is unchanged from the previous visit. He states it only bothers him when he is not actively doing other things and has nothing else on his mind. He currently is not interested in any intervention or assessment at this time given the mild nature of his symptoms.  Plan  We will continue to follow his left inguinal pain symptomatically.

## 2015-04-09 NOTE — Progress Notes (Signed)
   Subjective:    Patient ID: Tyler Bridges, male    DOB: 11/10/1945, 69 y.o.   MRN: [id number]  HPI  Tyler Bridges is here for follow-up of his blood pressure and assessment of a 3 hour window of "lost time". Please see the A&P for the status of the pt's chronic medical problems.  With regards to the 3 hours of lost time a few weeks back he states that he had been binging on sweets and chocolates. He felt his sugars may have been very high at that time. He is not sure what happened for 3-4 hours but his wife came home and found him at the base of the stairs. He is not sure if he sat down or passed out but there was no trauma. He denies any symptoms prior to the event to include chest pain, palpitations, dizziness, headaches, weakness, or vertigo. He has had not had similar episodes prior to that nor since then. Today he feels well without any acute complaints and only notes his usual chronic pain complaints that are essentially unchanged from baseline.  Review of Systems  Constitutional: Negative for fever, activity change, appetite change, fatigue and unexpected weight change.  Respiratory: Negative for chest tightness, shortness of breath and wheezing.   Cardiovascular: Negative for chest pain, palpitations and leg swelling.  Gastrointestinal: Positive for abdominal pain. Negative for nausea and vomiting.       Chronic abdominal pain unchanged.  Genitourinary: Positive for frequency and difficulty urinating.       Nocturia 2.  Musculoskeletal: Positive for back pain and arthralgias. Negative for myalgias and joint swelling.  Skin: Negative for rash and wound.  Allergic/Immunologic: Positive for environmental allergies.  Neurological: Negative for dizziness, weakness and light-headedness.  Psychiatric/Behavioral: Positive for sleep disturbance. Negative for dysphoric mood and decreased concentration. The patient is not nervous/anxious.        Chronic insomnia.      Objective:   Physical Exam  Constitutional: He is oriented to person, place, and time. He appears well-developed and well-nourished. No distress.  HENT:  Head: Normocephalic and atraumatic.  Eyes: Conjunctivae are normal. Right eye exhibits no discharge. Left eye exhibits no discharge. No scleral icterus.  Cardiovascular: Normal rate, regular rhythm and normal heart sounds.  Exam reveals no gallop and no friction rub.   No murmur heard. Pulmonary/Chest: Effort normal and breath sounds normal. No respiratory distress. He has no wheezes. He has no rales.  Abdominal: Soft. Bowel sounds are normal. He exhibits no distension. There is no tenderness. There is no rebound and no guarding.  Musculoskeletal: Normal range of motion. He exhibits no edema or tenderness.  Neurological: He is alert and oriented to person, place, and time. He exhibits normal muscle tone.  Skin: Skin is warm and dry. No rash noted. He is not diaphoretic. No erythema. No pallor.  Psychiatric: He has a normal mood and affect. His behavior is normal. Judgment and thought content normal.  Nursing note and vitals reviewed.     Assessment & Plan:   Please see problem-oriented charting.

## 2015-04-09 NOTE — Assessment & Plan Note (Signed)
Assessment  He notes nocturia 2 but states this is improved recently. Although his stream is decreased he is not having trouble emptying his bladder at this time as he did before.  Plan  We will continue to follow his nocturia symptomatically. If it worsens or he agrees to therapy we can start an alpha blocker which may also be useful for management of his hypertension as well. This will be discussed at the follow-up visit.

## 2015-04-10 LAB — MICROALBUMIN / CREATININE URINE RATIO
CREATININE, UR: 89.1 mg/dL
MICROALB/CREAT RATIO: 18.7 mg/g creat (ref 0.0–30.0)
MICROALBUM., U, RANDOM: 16.7 ug/mL

## 2015-04-10 LAB — HEPATITIS C ANTIBODY: Hep C Virus Ab: <0.1 s/co ratio (ref 0.0–0.9)

## 2015-04-13 NOTE — Progress Notes (Signed)
Hepatitis C Ab negative.  Urine studies still pending.

## 2015-04-15 LAB — TOXASSURE SELECT,+ANTIDEPR,UR: PDF: 0

## 2015-05-19 ENCOUNTER — Other Ambulatory Visit: Payer: Self-pay | Admitting: Internal Medicine

## 2015-05-19 DIAGNOSIS — E1140 Type 2 diabetes mellitus with diabetic neuropathy, unspecified: Principal | ICD-10-CM

## 2015-05-19 DIAGNOSIS — N521 Erectile dysfunction due to diseases classified elsewhere: Secondary | ICD-10-CM

## 2015-05-19 MED FILL — METFORMIN HCL ER 500 MG TAB: 500 | 90 days supply | Qty: 270 | Fill #0

## 2015-05-21 ENCOUNTER — Ambulatory Visit (INDEPENDENT_AMBULATORY_CARE_PROVIDER_SITE_OTHER): Payer: PPO | Admitting: Internal Medicine

## 2015-05-21 ENCOUNTER — Encounter: Payer: Self-pay | Admitting: Internal Medicine

## 2015-05-21 VITALS — BP 172/112 | HR 79 | Temp 98.1°F | Wt 212.2 lb

## 2015-05-21 DIAGNOSIS — M1712 Unilateral primary osteoarthritis, left knee: Secondary | ICD-10-CM

## 2015-05-21 DIAGNOSIS — E781 Pure hyperglyceridemia: Secondary | ICD-10-CM | POA: Diagnosis not present

## 2015-05-21 DIAGNOSIS — Z0000 Encounter for general adult medical examination without abnormal findings: Secondary | ICD-10-CM

## 2015-05-21 DIAGNOSIS — E1140 Type 2 diabetes mellitus with diabetic neuropathy, unspecified: Secondary | ICD-10-CM | POA: Diagnosis not present

## 2015-05-21 DIAGNOSIS — N521 Erectile dysfunction due to diseases classified elsewhere: Secondary | ICD-10-CM

## 2015-05-21 DIAGNOSIS — I10 Essential (primary) hypertension: Secondary | ICD-10-CM

## 2015-05-21 DIAGNOSIS — R351 Nocturia: Secondary | ICD-10-CM

## 2015-05-21 DIAGNOSIS — N401 Enlarged prostate with lower urinary tract symptoms: Secondary | ICD-10-CM

## 2015-05-21 DIAGNOSIS — Z7984 Long term (current) use of oral hypoglycemic drugs: Secondary | ICD-10-CM | POA: Diagnosis not present

## 2015-05-21 LAB — GLUCOSE, CAPILLARY: GLUCOSE-CAPILLARY: 263 mg/dL — AB (ref 65–99)

## 2015-05-21 LAB — POCT GLYCOSYLATED HEMOGLOBIN (HGB A1C): HEMOGLOBIN A1C: 7.7

## 2015-05-21 NOTE — Assessment & Plan Note (Signed)
Assessment  He has not tolerated fibrates for his hypertriglyceridemia related to the abdominal pain they cause him. His non-HDL LDL is above target and we should treat it with statin therapy, especially given his diabetes.  Plan  We will discuss the issue of starting a high intensity statin at the follow-up visit since we are unable to start fiber 8 therapy for his hypertriglyceridemia.

## 2015-05-21 NOTE — Assessment & Plan Note (Signed)
Assessment  His osteoarthritis continues to be problematic and limits what he can do although he remains remarkably active given the degree of his osteoarthritis.  Plan  Continue the as needed nonsteroidal anti-inflammatory agents. When he is ready for surgical intervention he knows to contact his orthopedic surgeon who has already offered this therapy. In the meantime, he will contact the surgeon should he require any intra-articular steroid injections. We will reassess the control of his osteoarthritis of the knee at the follow-up visit.

## 2015-05-21 NOTE — Assessment & Plan Note (Signed)
Assessment  Not unsurprisingly his blood pressure remains elevated today since he did not start his lisinopril as we had decided upon at the follow-up visit. His wife tells me that the medication was purchased but he just refused to take it. I stressed the importance of getting control of his blood pressure in the setting of his diabetes. He states he was unaware of the importance of taking this medication. He says he will take it now that this has been explained.  Plan  Start lisinopril 10 mg by mouth daily and reassess the potassium and renal function at the follow-up visit. We will also assess the control of his hypertension although I believe he will require an escalation in his ACE inhibitor dose. Given that he has nocturia 2 we will also consider initiation of alpha blocker therapy at follow-up.

## 2015-05-21 NOTE — Patient Instructions (Signed)
It was good to see you again.   1) Start lisinopril 10 mg by mouth daily.  This was the [facility] that [facility].  2) Keep taking all of the other medications like you are.  I will see you in 3 months, sooner if necessary.

## 2015-05-21 NOTE — Progress Notes (Signed)
   Subjective:    Patient ID: Tyler Bridges, male    DOB: [birth year], 70 y.o.   MRN: [id number]  HPI  Tyler Bridges is here for follow-up of his hypertension and renal function after starting lisinopril last month. Unfortunately, he decided not to take the lisinopril. Please see the A&P for the status of the pt's chronic medical problems.  Review of Systems  Constitutional: Negative for activity change, appetite change and unexpected weight change.       Diet was poor over the holidays resulting in an expected weight gain.  Gastrointestinal: Positive for abdominal pain. Negative for nausea and vomiting.  Genitourinary: Positive for frequency and difficulty urinating.       Nocturia X 2 unchanged.  Musculoskeletal: Positive for back pain, joint swelling, arthralgias and gait problem.       Left knee pain with occasional swelling > right knee pain.  Remains uninterested in TKA at this point despite being offered.  Psychiatric/Behavioral: Negative for dysphoric mood. The patient is not nervous/anxious.       Objective:   Physical Exam  Constitutional: He is oriented to person, place, and time. He appears well-developed and well-nourished. No distress.  HENT:  Head: Normocephalic and atraumatic.  Eyes: Conjunctivae are normal. Right eye exhibits no discharge. Left eye exhibits no discharge. No scleral icterus.  Cardiovascular: Normal rate, regular rhythm and normal heart sounds.   Pulmonary/Chest: Effort normal and breath sounds normal. No respiratory distress. He has no wheezes. He has no rales.  Abdominal: Soft. Bowel sounds are normal. He exhibits distension. There is no tenderness. There is no rebound and no guarding.  Musculoskeletal: Normal range of motion. He exhibits no edema or tenderness.  Neurological: He is alert and oriented to person, place, and time. He exhibits normal muscle tone.  Skin: Skin is warm and dry. No rash noted. He is not diaphoretic. No erythema.  Psychiatric:  He has a normal mood and affect. His behavior is normal. Judgment and thought content normal.  Nursing note and vitals reviewed.     Assessment & Plan:   Please see problem oriented charting.

## 2015-05-21 NOTE — Assessment & Plan Note (Signed)
Assessment  He continues to have nocturia 2.  Plan  If he is to continue to require an escalation in the pharmacologic therapy for his hypertension we will consider the next agent to be a peripheral alpha-blocker. We will reassess the need for additional pharmacologic therapy for his hypertension at the follow-up visit. Given his disinterest in compliance with the medical regimen, initiating alpha-blocker therapy at this time likely not to be beneficial.

## 2015-05-21 NOTE — Assessment & Plan Note (Signed)
Assessment  His diabetic control has deteriorated over the last 3 months with his hemoglobin A1c going from 7.0-7.7. He states this is related to dietary indiscretions over the holidays. He states he has been compliant with his metformin XR 1500 mg by mouth daily. Of note, his wife tells me outside of the office that he must not be consistent with his diabetic medication as he still has extra pills in his bottle.  Plan  I stressed the importance of diet as an adjunct to control his diabetes. I also encouraged him to continue the metformin as prescribed at 1500 mg by mouth daily. At the follow-up visit we will discuss an ophthalmologic examination as he is due. He is otherwise up-to-date on his diabetic health care maintenance. We will reassess his diabetic control at the follow-up visit with hopefully improved dietary discipline which he assures me he will accomplish.

## 2015-05-21 NOTE — Assessment & Plan Note (Signed)
He is up-to-date on his health care maintenance except for a diabetic retinal examination. We will discuss this issue with him at the follow-up visit.

## 2015-05-25 MED FILL — LISINOPRIL 10 MG TABLET: 10 | 90 days supply | Qty: 90 | Fill #0

## 2015-06-08 DIAGNOSIS — M1711 Unilateral primary osteoarthritis, right knee: Secondary | ICD-10-CM | POA: Diagnosis not present

## 2015-06-08 DIAGNOSIS — M1712 Unilateral primary osteoarthritis, left knee: Secondary | ICD-10-CM | POA: Diagnosis not present

## 2015-06-10 ENCOUNTER — Other Ambulatory Visit: Payer: Self-pay | Admitting: *Deleted

## 2015-06-10 DIAGNOSIS — N521 Erectile dysfunction due to diseases classified elsewhere: Secondary | ICD-10-CM

## 2015-06-10 DIAGNOSIS — E1140 Type 2 diabetes mellitus with diabetic neuropathy, unspecified: Principal | ICD-10-CM

## 2015-06-10 MED ORDER — GLUCOSE BLOOD VI STRP: ORAL_STRIP | Status: DC

## 2015-06-10 MED FILL — ONE TOUCH ULTRA TEST STRIPS: 90 days supply | Qty: 100 | Fill #0

## 2015-08-30 MED FILL — METFORMIN HCL ER 500 MG TAB: 500 | 90 days supply | Qty: 270 | Fill #1

## 2015-08-30 MED FILL — LISINOPRIL 10 MG TABLET: 10 | 90 days supply | Qty: 90 | Fill #1

## 2015-11-01 ENCOUNTER — Telehealth: Payer: Self-pay | Admitting: *Deleted

## 2015-11-01 DIAGNOSIS — K219 Gastro-esophageal reflux disease without esophagitis: Secondary | ICD-10-CM

## 2015-11-01 MED ORDER — PANTOPRAZOLE SODIUM 40 MG PO TBEC: 40.0000 mg | DELAYED_RELEASE_TABLET | Freq: Every day | ORAL | Status: DC

## 2015-11-01 MED ORDER — ESOMEPRAZOLE MAGNESIUM 40 MG PO CPDR: 40.0000 mg | DELAYED_RELEASE_CAPSULE | Freq: Every day | ORAL | Status: DC

## 2015-11-01 NOTE — Telephone Encounter (Signed)
Clarified this was for pantoprazole Vs esomeprazole.  Will write for both (40 mg daily) so they can figure this out and then correct upon medication reconciliation at follow-up.

## 2015-11-01 NOTE — Telephone Encounter (Signed)
Pt's spouse calls and states pharmacist told her that instead of 28.00 a month that pt can get one of these for 4.00 so if you could send both scripts he will pick the cheapest [place] he has been eating junk food and not taking meds so his blood sugars are running high but wife is monitoring meds and food now

## 2015-11-02 MED FILL — PANTOPRAZOLE SOD DR 40 MG T: 40 | 90 days supply | Qty: 90 | Fill #0

## 2015-11-23 ENCOUNTER — Telehealth: Payer: Self-pay | Admitting: *Deleted

## 2015-11-23 DIAGNOSIS — N401 Enlarged prostate with lower urinary tract symptoms: Secondary | ICD-10-CM

## 2015-11-23 DIAGNOSIS — R351 Nocturia: Principal | ICD-10-CM

## 2015-11-23 NOTE — Telephone Encounter (Signed)
We will await dr Charlesetta Shanksklima's ok

## 2015-11-23 NOTE — Telephone Encounter (Signed)
Pt's spouse presents to lab this am with urine sample from pt, she cleaned a bottle out and had him give a sample of urine, she states he has burning upon urination and low to mid back/ side pain not like his normal back discomfort, denies fevers but has not checked, she is asking for a u/a and possible c&s Do you agree? Would you place an order?  Thank you

## 2015-11-24 NOTE — Telephone Encounter (Signed)
Scheduled w/ dr Josem Kaufmannklima 8/25, per pt's desire

## 2015-11-30 NOTE — Telephone Encounter (Signed)
We actually need a clean catch.  Please provde his spouse with the appropriate supplies to have sample provided just before she comes to work so it can be submitted fresh.  Order has been placed.

## 2015-12-06 MED FILL — LISINOPRIL 10 MG TABLET: 10 | 90 days supply | Qty: 90 | Fill #2

## 2015-12-06 MED FILL — METFORMIN HCL ER 500 MG TAB: 500 | 90 days supply | Qty: 270 | Fill #2

## 2015-12-06 NOTE — Telephone Encounter (Signed)
Spouse forgot to pick up last week, has gotten the cup, wipes, bag and instructions for clean catch urine

## 2015-12-31 ENCOUNTER — Ambulatory Visit (INDEPENDENT_AMBULATORY_CARE_PROVIDER_SITE_OTHER): Payer: PPO | Admitting: Internal Medicine

## 2015-12-31 ENCOUNTER — Encounter: Payer: Self-pay | Admitting: Internal Medicine

## 2015-12-31 VITALS — BP 136/68 | HR 69 | Wt 207.0 lb

## 2015-12-31 DIAGNOSIS — I10 Essential (primary) hypertension: Secondary | ICD-10-CM

## 2015-12-31 DIAGNOSIS — I700 Atherosclerosis of aorta: Secondary | ICD-10-CM

## 2015-12-31 DIAGNOSIS — M1712 Unilateral primary osteoarthritis, left knee: Secondary | ICD-10-CM

## 2015-12-31 DIAGNOSIS — Z0000 Encounter for general adult medical examination without abnormal findings: Secondary | ICD-10-CM

## 2015-12-31 DIAGNOSIS — N521 Erectile dysfunction due to diseases classified elsewhere: Secondary | ICD-10-CM | POA: Diagnosis not present

## 2015-12-31 DIAGNOSIS — Z7984 Long term (current) use of oral hypoglycemic drugs: Secondary | ICD-10-CM

## 2015-12-31 DIAGNOSIS — Z23 Encounter for immunization: Secondary | ICD-10-CM

## 2015-12-31 DIAGNOSIS — E1140 Type 2 diabetes mellitus with diabetic neuropathy, unspecified: Principal | ICD-10-CM

## 2015-12-31 DIAGNOSIS — E1149 Type 2 diabetes mellitus with other diabetic neurological complication: Secondary | ICD-10-CM

## 2015-12-31 DIAGNOSIS — K219 Gastro-esophageal reflux disease without esophagitis: Secondary | ICD-10-CM

## 2015-12-31 DIAGNOSIS — Z6831 Body mass index (BMI) 31.0-31.9, adult: Secondary | ICD-10-CM

## 2015-12-31 DIAGNOSIS — E669 Obesity, unspecified: Secondary | ICD-10-CM

## 2015-12-31 DIAGNOSIS — Z79899 Other long term (current) drug therapy: Secondary | ICD-10-CM

## 2015-12-31 DIAGNOSIS — G8929 Other chronic pain: Secondary | ICD-10-CM

## 2015-12-31 DIAGNOSIS — M545 Low back pain: Secondary | ICD-10-CM

## 2015-12-31 LAB — GLUCOSE, CAPILLARY: Glucose-Capillary: 154 mg/dL — ABNORMAL HIGH (ref 65–99)

## 2015-12-31 LAB — POCT GLYCOSYLATED HEMOGLOBIN (HGB A1C): HEMOGLOBIN A1C: 6.6

## 2015-12-31 MED ORDER — PANTOPRAZOLE SODIUM 40 MG PO TBEC: 40.0000 mg | DELAYED_RELEASE_TABLET | Freq: Every day | ORAL | 3 refills | Status: DC

## 2015-12-31 MED ORDER — ATORVASTATIN CALCIUM 40 MG PO TABS: 40.0000 mg | ORAL_TABLET | Freq: Every day | ORAL | 3 refills | Status: DC

## 2015-12-31 MED ORDER — ETODOLAC 400 MG PO TABS: 400.0000 mg | ORAL_TABLET | Freq: Two times a day (BID) | ORAL | 11 refills | Status: DC | PRN

## 2015-12-31 MED FILL — ETODOLAC 400 MG TABLET: 400 | 30 days supply | Qty: 60 | Fill #0
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
## 2015-12-31 NOTE — Assessment & Plan Note (Signed)
He was given the flu vaccine today. We will discuss the tetanus booster at the follow-up visit. He would like to defer referral to the ophthalmologist at this time but is willing to discuss this at the follow-up visit. He is otherwise up-to-date on his health care maintenance.

## 2015-12-31 NOTE — Assessment & Plan Note (Signed)
Assessment  His gastroesophageal reflux disease is symptomatically well controlled on PPI therapy. Initially he was on Nexium which was too expensive. He therefore was switched to pantoprazole and this was effective as well as cheaper. Unfortunately he recently ran out of the pantoprazole.  Plan  The pantoprazole was represcribed at 40 mg by mouth daily. We will reassess its efficacy at controlling his gastroesophageal reflux disease symptoms at the follow-up visit.

## 2015-12-31 NOTE — Assessment & Plan Note (Signed)
Assessment  His blood pressure control was markedly improved today at 136/68. This was achieved with weight loss and lisinopril 10 mg by mouth daily.  Plan  He was encouraged to continue with his weight loss as well as his compliance with the lisinopril at 10 mg by mouth daily. We will reassess his blood pressure control at the follow-up visit.

## 2015-12-31 NOTE — Assessment & Plan Note (Signed)
Assessment  He has no symptoms of claudication to suggest that his aortic atherosclerotic disease is clinically symptomatic.  Plan  We will continue with aggressive risk factor modification including control of his diabetes, hypertension, and cholesterol with the addition of the statin therapy. We will reassess for evidence of clinically significant aortic atherosclerotic disease at the follow-up visit.

## 2015-12-31 NOTE — Patient Instructions (Addendum)
It was great to see you again.  You are doing a great job with your diabetes and blood pressure!  1)  I started etodolac 400 mg up to twice a day as needed for pain.  Take it with food.  2) I started atorvastatin 40 mg daily for your cholesterol because of the diabetes.  3) Keep taking all of the other medications as you are.  4) I ordered an MRI if your spine to see you your have spinal stenosis as a cause of your back pain.  5) I will call you with your lab results next week when I get the results.  6) We gave you the flu shot today.  I will see you back in 3 months, sooner if necessary.

## 2015-12-31 NOTE — Assessment & Plan Note (Signed)
Assessment  His diabetic control is much improved with weight loss and a better diet. His hemoglobin A1c today is 6.6 down from 7.7 at the last visit. This is on metformin 1000 mg in the morning and 500 mg in the evening. He is also on lisinopril 10 mg by mouth daily for renal protection. We did not discuss the erectile dysfunction at this visit.  Plan  He was praised for his marked improvement in his diabetic control with his weight loss and diet. He was asked to continue the metformin at 1000 mg in the morning and 500 mg in the evening. He was also asked to continue with the lisinopril 10 mg by mouth daily as a renal protectant. Aspirin was continued at 81 mg by mouth daily and atorvastatin 40 mg by mouth daily was started given the underlying diabetes. We will assess his tolerance of this statin therapy at the follow-up visit. He was not interested in a diabetic eye exam at this time. He was willing to discuss this at the follow-up visit. He is otherwise up-to-date on his diabetic health care maintenance.

## 2015-12-31 NOTE — Progress Notes (Signed)
   Subjective:    Patient ID: Tyler Bridges, male    DOB: 1945-07-23, 70 y.o.   MRN: [id number]  HPI  Tyler Bridges is here for follow-up of his diabetes, hypertension, chronic back pain, and weight. Please see the A&P for the status of the pt's chronic medical problems.  Review of Systems  Constitutional: Negative for activity change, appetite change and unexpected weight change.  Respiratory: Negative for cough, chest tightness, shortness of breath and wheezing.   Cardiovascular: Negative for chest pain, palpitations and leg swelling.  Gastrointestinal: Positive for abdominal pain. Negative for constipation, diarrhea, nausea and vomiting.  Genitourinary: Positive for difficulty urinating.  Musculoskeletal: Positive for arthralgias and back pain. Negative for joint swelling.  Skin: Negative for color change, pallor, rash and wound.  Neurological: Negative for weakness.      Objective:   Physical Exam  Constitutional: He is oriented to person, place, and time. He appears well-developed and well-nourished. No distress.  HENT:  Head: Normocephalic and atraumatic.  Eyes: Conjunctivae are normal. Right eye exhibits no discharge. Left eye exhibits no discharge. No scleral icterus.  Cardiovascular: Normal rate, regular rhythm and normal heart sounds.  Exam reveals no gallop and no friction rub.   No murmur heard. Pulmonary/Chest: Effort normal and breath sounds normal. No respiratory distress. He has no wheezes. He has no rales.  Abdominal: Soft. Bowel sounds are normal. He exhibits no distension. There is no tenderness. There is no rebound and no guarding.  Musculoskeletal: Normal range of motion. He exhibits no edema or tenderness.  Neurological: He is alert and oriented to person, place, and time. He exhibits normal muscle tone.  Skin: Skin is warm and dry. No rash noted. He is not diaphoretic. No erythema. No pallor.  Psychiatric: He has a normal mood and affect. His behavior is  normal. Judgment and thought content normal.  Nursing note and vitals reviewed.     Assessment & Plan:   Please see problem oriented charting.

## 2015-12-31 NOTE — Assessment & Plan Note (Signed)
Assessment  He has lost 8 pounds in the last 8 months and his weight is now 207 pounds. This is resulted in improvements in his diabetic control, blood pressure control, and left knee osteoarthritis pain.  Plan  He was praised for his weight loss over the last 8 months and encouraged to continue with further weight loss as this will assist in the long-term management of his diabetes, hypertension, hyperlipidemia, and osteoarthritic pain. We will reassess his weight at the follow-up visit.

## 2015-12-31 NOTE — Assessment & Plan Note (Signed)
Assessment  He states that his left knee pain is actually improved with his recent weight loss. His knee will likely benefit from the nonsteroidal anti-inflammatory therapy for his back.  Plan  He was encouraged to continue with his efforts at weight loss and to try the etodolac as needed not only for his back pain but for his knee pain as well.

## 2016-01-01 LAB — BMP8+ANION GAP
ANION GAP: 19 mmol/L — AB (ref 10.0–18.0)
BUN/Creatinine Ratio: 18 (ref 10–24)
BUN: 12 mg/dL (ref 8–27)
CALCIUM: 9 mg/dL (ref 8.6–10.2)
CO2: 20 mmol/L (ref 18–29)
CREATININE: 0.68 mg/dL — AB (ref 0.76–1.27)
Chloride: 100 mmol/L (ref 96–106)
GFR calc Af Amer: 111 mL/min/{1.73_m2} (ref >59)
GFR, EST NON AFRICAN AMERICAN: 96 mL/min/{1.73_m2} (ref >59)
Glucose: 138 mg/dL — ABNORMAL HIGH (ref 65–99)
POTASSIUM: 4.7 mmol/L (ref 3.5–5.2)
Sodium: 139 mmol/L (ref 134–144)

## 2016-01-03 ENCOUNTER — Telehealth: Payer: Self-pay | Admitting: *Deleted

## 2016-01-03 DIAGNOSIS — K219 Gastro-esophageal reflux disease without esophagitis: Secondary | ICD-10-CM

## 2016-01-03 NOTE — Telephone Encounter (Signed)
Pt's wife states it takes two pantoprazoles to control symptoms and requests a new rx with BID dosing.  She is also concerned about patient starting back on a statin.  States pt has been on atorvastatin in the past and it caused muscle pain/weakness, made him feel bad, and that's why they had stopped it before   She would like to know what cholesterol is (since he's been taking flax seeds) before restarting a statin.  Will forward info to pcp for review, please advise.Kingsley SpittleGoldston, Emaya Preston Cassady8/28/201710:46 AM

## 2016-01-03 NOTE — Progress Notes (Signed)
Patient ID: Tyler Bridges, male   DOB: 07-27-1945, 70 y.o.   MRN: [id number]  BMP: K 4.7, BUN 12, Cr 0.68, eGFR 96  Tolerating the potassium well without evidence of hyperkalemia or deterioration in renal function.  We will continue the lisinopril at 10 mg daily.

## 2016-01-04 MED ORDER — PANTOPRAZOLE SODIUM 40 MG PO TBEC: 40.0000 mg | DELAYED_RELEASE_TABLET | Freq: Two times a day (BID) | ORAL | 3 refills | Status: DC

## 2016-01-04 MED FILL — PANTOPRAZOLE SOD DR 40 MG T: 40 | 90 days supply | Qty: 180 | Fill #0

## 2016-01-04 NOTE — Telephone Encounter (Signed)
I changed the pantoprazole to BID dosing since it sounds like the dose he is taking to control his symptoms.  I discontinued the atorvastatin and added it to his "allergies" list.  We can check the lipid panel at the follow-up visit, but he will need some type of antihyperlipidemic therapy given his diabetes and statins have been shown to provide a survival benefit.  He may be a candidate for a new investigational drug that acts similar to statins but at a different location in the reaction and thus should not result in myalgias.  We will discuss this with Tyler Bridges further to see if he may be interested.  Please contact [name] with this information to update her given her concerns.  Thanks.

## 2016-01-08 ENCOUNTER — Ambulatory Visit (HOSPITAL_BASED_OUTPATIENT_CLINIC_OR_DEPARTMENT_OTHER)
Admission: RE | Admit: 2016-01-08 | Discharge: 2016-01-08 | Disposition: A | Discharge status: Home / Self Care | Payer: PPO | Source: Ambulatory Visit | Attending: Internal Medicine | Admitting: Internal Medicine

## 2016-01-08 DIAGNOSIS — M545 Low back pain, unspecified: Secondary | ICD-10-CM

## 2016-01-08 DIAGNOSIS — M4806 Spinal stenosis, lumbar region: Secondary | ICD-10-CM | POA: Diagnosis not present

## 2016-01-08 DIAGNOSIS — M5126 Other intervertebral disc displacement, lumbar region: Secondary | ICD-10-CM | POA: Insufficient documentation

## 2016-01-08 DIAGNOSIS — G8929 Other chronic pain: Secondary | ICD-10-CM | POA: Insufficient documentation

## 2016-03-13 MED FILL — ETODOLAC 400 MG TABLET: 400 | 30 days supply | Qty: 60 | Fill #1

## 2016-03-15 MED FILL — METFORMIN HCL ER 500 MG TAB: 500 | 90 days supply | Qty: 270 | Fill #3

## 2016-03-15 MED FILL — LISINOPRIL 10 MG TABLET: 10 | 90 days supply | Qty: 90 | Fill #3

## 2016-05-29 MED FILL — ETODOLAC 400 MG TABLET: 400 | 30 days supply | Qty: 60 | Fill #2

## 2016-05-30 MED FILL — PANTOPRAZOLE SOD DR 40 MG T: 40 | 90 days supply | Qty: 180 | Fill #1

## 2016-06-13 ENCOUNTER — Ambulatory Visit: Payer: PPO | Admitting: *Deleted

## 2016-06-13 DIAGNOSIS — M25562 Pain in left knee: Secondary | ICD-10-CM | POA: Diagnosis not present

## 2016-06-13 DIAGNOSIS — R262 Difficulty in walking, not elsewhere classified: Secondary | ICD-10-CM | POA: Diagnosis not present

## 2016-06-13 DIAGNOSIS — M170 Bilateral primary osteoarthritis of knee: Secondary | ICD-10-CM | POA: Diagnosis not present

## 2016-06-13 DIAGNOSIS — M25561 Pain in right knee: Secondary | ICD-10-CM | POA: Diagnosis not present

## 2016-06-13 DIAGNOSIS — Z0000 Encounter for general adult medical examination without abnormal findings: Secondary | ICD-10-CM

## 2016-06-26 ENCOUNTER — Other Ambulatory Visit: Payer: Self-pay | Admitting: *Deleted

## 2016-06-26 ENCOUNTER — Other Ambulatory Visit: Payer: Self-pay | Admitting: Internal Medicine

## 2016-06-26 DIAGNOSIS — I10 Essential (primary) hypertension: Secondary | ICD-10-CM

## 2016-06-26 MED ORDER — LISINOPRIL 10 MG PO TABS: 10.0000 mg | ORAL_TABLET | Freq: Every day | ORAL | 3 refills | Status: DC

## 2016-06-26 MED FILL — LISINOPRIL 10 MG TABLET: 10 | 90 days supply | Qty: 90 | Fill #0

## 2016-07-05 ENCOUNTER — Other Ambulatory Visit: Payer: Self-pay | Admitting: Internal Medicine

## 2016-07-05 DIAGNOSIS — E1140 Type 2 diabetes mellitus with diabetic neuropathy, unspecified: Principal | ICD-10-CM

## 2016-07-05 DIAGNOSIS — N521 Erectile dysfunction due to diseases classified elsewhere: Secondary | ICD-10-CM

## 2016-07-05 MED FILL — METFORMIN HCL ER 500 MG TAB: 500 | 90 days supply | Qty: 270 | Fill #0

## 2016-07-13 DIAGNOSIS — M25561 Pain in right knee: Secondary | ICD-10-CM | POA: Diagnosis not present

## 2016-07-13 DIAGNOSIS — M1711 Unilateral primary osteoarthritis, right knee: Secondary | ICD-10-CM | POA: Diagnosis not present

## 2016-07-13 DIAGNOSIS — R262 Difficulty in walking, not elsewhere classified: Secondary | ICD-10-CM | POA: Diagnosis not present

## 2016-07-13 DIAGNOSIS — M170 Bilateral primary osteoarthritis of knee: Secondary | ICD-10-CM | POA: Diagnosis not present

## 2016-07-20 DIAGNOSIS — M25561 Pain in right knee: Secondary | ICD-10-CM | POA: Diagnosis not present

## 2016-07-20 DIAGNOSIS — M1711 Unilateral primary osteoarthritis, right knee: Secondary | ICD-10-CM | POA: Diagnosis not present

## 2016-07-27 DIAGNOSIS — M1711 Unilateral primary osteoarthritis, right knee: Secondary | ICD-10-CM | POA: Diagnosis not present

## 2016-07-27 DIAGNOSIS — M25561 Pain in right knee: Secondary | ICD-10-CM | POA: Diagnosis not present

## 2016-08-02 ENCOUNTER — Other Ambulatory Visit: Payer: Self-pay | Admitting: *Deleted

## 2016-08-02 DIAGNOSIS — E1140 Type 2 diabetes mellitus with diabetic neuropathy, unspecified: Principal | ICD-10-CM

## 2016-08-02 DIAGNOSIS — N521 Erectile dysfunction due to diseases classified elsewhere: Secondary | ICD-10-CM

## 2016-08-02 MED ORDER — GLUCOSE BLOOD VI STRP: ORAL_STRIP | 3 refills | Status: DC

## 2016-08-02 MED FILL — ONE TOUCH ULTRA TEST STRIPS: 90 days supply | Qty: 100 | Fill #0

## 2016-08-03 DIAGNOSIS — M1711 Unilateral primary osteoarthritis, right knee: Secondary | ICD-10-CM | POA: Diagnosis not present

## 2016-08-03 DIAGNOSIS — M25561 Pain in right knee: Secondary | ICD-10-CM | POA: Diagnosis not present

## 2016-08-10 DIAGNOSIS — M1711 Unilateral primary osteoarthritis, right knee: Secondary | ICD-10-CM | POA: Diagnosis not present

## 2016-08-10 DIAGNOSIS — M25561 Pain in right knee: Secondary | ICD-10-CM | POA: Diagnosis not present

## 2016-08-16 ENCOUNTER — Encounter: Payer: Self-pay | Admitting: Internal Medicine

## 2016-08-16 ENCOUNTER — Ambulatory Visit (INDEPENDENT_AMBULATORY_CARE_PROVIDER_SITE_OTHER): Payer: PPO | Admitting: Internal Medicine

## 2016-08-16 VITALS — BP 125/60 | HR 74 | Temp 97.4°F | Ht 68.0 in | Wt 211.7 lb

## 2016-08-16 DIAGNOSIS — Z23 Encounter for immunization: Secondary | ICD-10-CM

## 2016-08-16 DIAGNOSIS — E669 Obesity, unspecified: Secondary | ICD-10-CM

## 2016-08-16 DIAGNOSIS — E6609 Other obesity due to excess calories: Secondary | ICD-10-CM | POA: Diagnosis not present

## 2016-08-16 DIAGNOSIS — M545 Low back pain, unspecified: Secondary | ICD-10-CM

## 2016-08-16 DIAGNOSIS — Z0000 Encounter for general adult medical examination without abnormal findings: Secondary | ICD-10-CM

## 2016-08-16 DIAGNOSIS — Z6832 Body mass index (BMI) 32.0-32.9, adult: Secondary | ICD-10-CM | POA: Diagnosis not present

## 2016-08-16 DIAGNOSIS — H11003 Unspecified pterygium of eye, bilateral: Secondary | ICD-10-CM | POA: Diagnosis not present

## 2016-08-16 DIAGNOSIS — I700 Atherosclerosis of aorta: Secondary | ICD-10-CM | POA: Diagnosis not present

## 2016-08-16 DIAGNOSIS — K219 Gastro-esophageal reflux disease without esophagitis: Secondary | ICD-10-CM

## 2016-08-16 DIAGNOSIS — N521 Erectile dysfunction due to diseases classified elsewhere: Secondary | ICD-10-CM

## 2016-08-16 DIAGNOSIS — Z7982 Long term (current) use of aspirin: Secondary | ICD-10-CM

## 2016-08-16 DIAGNOSIS — G8929 Other chronic pain: Secondary | ICD-10-CM | POA: Diagnosis not present

## 2016-08-16 DIAGNOSIS — H35373 Puckering of macula, bilateral: Secondary | ICD-10-CM | POA: Diagnosis not present

## 2016-08-16 DIAGNOSIS — E1140 Type 2 diabetes mellitus with diabetic neuropathy, unspecified: Secondary | ICD-10-CM | POA: Diagnosis not present

## 2016-08-16 DIAGNOSIS — I10 Essential (primary) hypertension: Secondary | ICD-10-CM

## 2016-08-16 DIAGNOSIS — E119 Type 2 diabetes mellitus without complications: Secondary | ICD-10-CM | POA: Diagnosis not present

## 2016-08-16 DIAGNOSIS — E781 Pure hyperglyceridemia: Secondary | ICD-10-CM

## 2016-08-16 DIAGNOSIS — M1712 Unilateral primary osteoarthritis, left knee: Secondary | ICD-10-CM

## 2016-08-16 DIAGNOSIS — Z7984 Long term (current) use of oral hypoglycemic drugs: Secondary | ICD-10-CM

## 2016-08-16 DIAGNOSIS — H2513 Age-related nuclear cataract, bilateral: Secondary | ICD-10-CM | POA: Diagnosis not present

## 2016-08-16 DIAGNOSIS — Z79899 Other long term (current) drug therapy: Secondary | ICD-10-CM

## 2016-08-16 DIAGNOSIS — M170 Bilateral primary osteoarthritis of knee: Secondary | ICD-10-CM | POA: Diagnosis not present

## 2016-08-16 DIAGNOSIS — H43813 Vitreous degeneration, bilateral: Secondary | ICD-10-CM | POA: Diagnosis not present

## 2016-08-16 LAB — GLUCOSE, CAPILLARY: Glucose-Capillary: 167 mg/dL — ABNORMAL HIGH (ref 65–99)

## 2016-08-16 LAB — POCT GLYCOSYLATED HEMOGLOBIN (HGB A1C): HEMOGLOBIN A1C: 7.4

## 2016-08-16 MED ORDER — LISINOPRIL 20 MG PO TABS: 20.0000 mg | ORAL_TABLET | Freq: Every day | ORAL | 3 refills | Status: DC

## 2016-08-16 MED FILL — ETODOLAC 400 MG TABLET: 400 | 30 days supply | Qty: 60 | Fill #3

## 2016-08-16 NOTE — Assessment & Plan Note (Signed)
Assessment  His aortic atherosclerotic disease remains asymptomatic as he is without claudication at this time.  Plan  We will continue to aggressively treat his diabetes as noted above, his hypertension with the increase in the lisinopril dose, and the aspirin therapy. I will also try to convince him on the importance of statin therapy given his diabetes and aortic atherosclerotic disease. We will reassess for symptomatic aortic atherosclerotic disease at the follow-up visit.

## 2016-08-16 NOTE — Assessment & Plan Note (Signed)
Tyler Bridges  His hemoglobin A1c today was elevated at 7.4. He admits that he has been eating desserts on a nightly basis. He also has had a 4 pound weight gain in the interim. He has been compliant with his metformin 1000 mg in the morning and 500 mg in the evening. He feels he can easily correct his hemoglobin A1c by watching his diet better and would like to try this first.  Plan  We will continue metformin at 1000 mg by mouth in the morning and 500 mg by mouth in the evening. We will encourage him to continue to control his portion sizes as well as his sweets and reassess the hemoglobin A1c at the follow-up visit. He has a diabetic eye exam scheduled after this appointment and had a complete diabetic foot exam at this appointment. He is otherwise up-to-date on his diabetic health care maintenance.

## 2016-08-16 NOTE — Assessment & Plan Note (Signed)
Assessment  He has put on an 4 pounds since the last clinic visit 6 months ago. He admits this is likely related to the holiday food binging that he undertook, but also related to the increased sweets that he has been eating as of late. He is fully aware of why his weight has gone up and firmly believes he is able to control his sugar intake and sweets.  Plan  His plans to eliminate sugary foods and desserts and and was praised for this effort. He was also reminded to watch his portion sizes. We will reassess his weight at the follow-up visit.

## 2016-08-16 NOTE — Progress Notes (Signed)
   Subjective:    Patient ID: Tyler Bridges, male    DOB: [birth year], 71 y.o.   MRN: [id number]  HPI  Tyler Bridges is here for follow-up of his diabetes, obesity, knee osteoarthritis, and chronic back pain. Please see the A&P for the status of the pt's chronic medical problems.  Review of Systems  Constitutional: Negative for activity change, appetite change and unexpected weight change.  Eyes: Positive for visual disturbance.       Decreased peripheral vision.  Respiratory: Negative for cough, chest tightness, shortness of breath and wheezing.   Cardiovascular: Negative for chest pain, palpitations and leg swelling.  Gastrointestinal: Negative for constipation, diarrhea, nausea and vomiting.  Musculoskeletal: Positive for arthralgias and back pain. Negative for gait problem, joint swelling and myalgias.  Skin: Negative for color change, rash and wound.      Objective:   Physical Exam  Constitutional: He is oriented to person, place, and time. He appears well-developed and well-nourished. No distress.  HENT:  Head: Normocephalic and atraumatic.  Eyes: Conjunctivae are normal. Right eye exhibits no discharge. Left eye exhibits no discharge. No scleral icterus.  Cardiovascular: Normal rate, regular rhythm and normal heart sounds.  Exam reveals no gallop and no friction rub.   No murmur heard. Pulmonary/Chest: Effort normal and breath sounds normal. No respiratory distress. He has no wheezes. He has no rales.  Abdominal: Soft. Bowel sounds are normal. He exhibits no distension. There is no tenderness. There is no rebound and no guarding.  Musculoskeletal: Normal range of motion. He exhibits no edema, tenderness or deformity.  Neurological: He is alert and oriented to person, place, and time. He exhibits normal muscle tone.  Skin: Skin is warm and dry. No rash noted. He is not diaphoretic. No erythema. No pallor.  Psychiatric: He has a normal mood and affect. His behavior is normal.  Judgment and thought content normal.  Nursing note and vitals reviewed.     Assessment & Plan:   Please see problem oriented charting.

## 2016-08-16 NOTE — Assessment & Plan Note (Addendum)
Assessment  He has not tolerated fibrate therapy for his hypertriglyceridemia. Because his non-HDL LDL is above target and the fact that he has diabetes he would benefit from statin therapy. That being said he and his wife are against the initiation of statin therapy. We therefore decided to reassess the level of his lipidemia by repeating the lipid panel today.  Plan  A lipid panel was obtained today and is pending at the time of this dictation. I will discuss the results with him once resulted later this week or early next week. At that time I will reintroduce that topic of the importance of statin therapy in a diabetic, particularly in one with aortic atherosclerotic disease as he has.  Addendum  Total cholesterol 186 Triglycerides 302 HDL 31 VLDL 60 LDL 95  10 year risk for a cardiovascular event is 42.8% (baseline 15.1%).  Mr. Esper qualifies for a moderate to high intensity statin and I would like to start a high intensity statin to lower this risk.  I tried calling Mr. Chait on his cell phone and home phone and received an identified voice mail message on his cell phone.  I left a message that I was calling about his recent lab results and that I would try calling back later.  He has previously been against statin therapy, but I will present him with his 10 year risk and see if this information changes his preference.

## 2016-08-16 NOTE — Assessment & Plan Note (Signed)
Assessment  His chronic low back pain is stable. He does get some relief with the etodolac 400 mg by mouth twice daily as needed.  Plan  We will continue the etodolac at 400 mg by mouth twice daily as needed and reassess his back pain control at the follow-up visit.

## 2016-08-16 NOTE — Assessment & Plan Note (Signed)
He was given a tetanus/diphtheria shot today. He is otherwise up-to-date on his health care maintenance.

## 2016-08-16 NOTE — Assessment & Plan Note (Signed)
Assessment  His blood pressure today was 125/60. Given the new ACC/AHA blood pressure guidelines this is slightly above his target of less than 120/80. He is currently on lisinopril 10 mg by mouth daily and tolerating it well.  Plan  We will increase the lisinopril to 20 mg by mouth daily and reassess his blood pressure at follow-up visit. We will also obtain a basic metabolic panel to follow-up visit given the escalation in the dose of his lisinopril.

## 2016-08-16 NOTE — Assessment & Plan Note (Signed)
Assessment  He continues to have osteoarthritic pain of both knees. He recently had flexogenic injections into his knee with improvement. He is planning on an injection in his other knee within the week. That being said, he does admit that the etodolac has been helpful in controlling some of his pain when used on an as-needed basis.  Plan  We will continue the etodolac at 400 mg every 12 hours as needed for pain. He will also complete his flexogenic therapy that he has initiated in the community. We will reassess the control of his knee osteoarthritis at the follow-up visit.

## 2016-08-16 NOTE — Patient Instructions (Signed)
It was good to see you again.  You are doing well and I think your diabetes can get better by avoiding sweets.  1) I gave you a tetanus-diphtheria shot today.  2) I checked the cholesterol today.  I will call you back next week with the results.  3) Please go to your eye appointment today.  4) I increased your lisinopril 20 mg daily for your blood pressure.  5) Keep taking your other medications as you are.  I will see you back in 3 months, sooner if better.

## 2016-08-16 NOTE — Assessment & Plan Note (Signed)
Assessment  His gastroesophageal reflux disease symptoms are well controlled on the pantoprazole 40 mg by mouth twice daily.  Plan  We will continue pantoprazole 40 mg by mouth twice daily and reassess his gastroesophageal reflux symptoms at follow-up visit.

## 2016-08-17 ENCOUNTER — Ambulatory Visit (INDEPENDENT_AMBULATORY_CARE_PROVIDER_SITE_OTHER): Payer: PPO | Admitting: Orthopaedic Surgery

## 2016-08-17 ENCOUNTER — Ambulatory Visit (INDEPENDENT_AMBULATORY_CARE_PROVIDER_SITE_OTHER): Payer: PPO

## 2016-08-17 ENCOUNTER — Encounter (INDEPENDENT_AMBULATORY_CARE_PROVIDER_SITE_OTHER): Payer: Self-pay | Admitting: Orthopaedic Surgery

## 2016-08-17 VITALS — BP 121/55 | HR 72 | Ht 64.0 in | Wt 225.0 lb

## 2016-08-17 DIAGNOSIS — M25511 Pain in right shoulder: Secondary | ICD-10-CM | POA: Diagnosis not present

## 2016-08-17 DIAGNOSIS — G8929 Other chronic pain: Secondary | ICD-10-CM

## 2016-08-17 LAB — LIPID PANEL
CHOLESTEROL TOTAL: 186 mg/dL (ref 100–199)
Chol/HDL Ratio: 6 ratio — ABNORMAL HIGH (ref 0.0–5.0)
HDL: 31 mg/dL — ABNORMAL LOW (ref >39)
LDL CALC: 95 mg/dL (ref 0–99)
Triglycerides: 302 mg/dL — ABNORMAL HIGH (ref 0–149)
VLDL Cholesterol Cal: 60 mg/dL — ABNORMAL HIGH (ref 5–40)
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
## 2016-08-28 ENCOUNTER — Telehealth: Payer: Self-pay | Admitting: *Deleted

## 2016-08-28 NOTE — Telephone Encounter (Signed)
Pt returning call from pcp-he can be reached at [phone].Tyler Bridges, Tyler Afshar Cassady4/23/201810:18 AM

## 2016-08-28 NOTE — Progress Notes (Signed)
Patient ID: Tyler Bridges, male   DOB: December 27, 1945, 71 y.o.   MRN: [id number]  I was able to get a hold of Tyler Bridges and we discussed his cholesterol results as well as his risk for a heart attack moving forward.  He wanted to discuss the results and risks with his wife.  I recommended a statin be started and he will call me if he would like to do so after his discussion with his spouse.  If he is not interested in starting a statin we would discuss this at his follow-up appointment.

## 2016-09-04 MED FILL — PANTOPRAZOLE SOD DR 40 MG T: 40 | 90 days supply | Qty: 180 | Fill #2

## 2016-09-05 DIAGNOSIS — H11013 Amyloid pterygium of eye, bilateral: Secondary | ICD-10-CM | POA: Diagnosis not present

## 2016-09-05 DIAGNOSIS — H25812 Combined forms of age-related cataract, left eye: Secondary | ICD-10-CM | POA: Diagnosis not present

## 2016-09-05 DIAGNOSIS — H2512 Age-related nuclear cataract, left eye: Secondary | ICD-10-CM | POA: Diagnosis not present

## 2016-09-05 DIAGNOSIS — H2511 Age-related nuclear cataract, right eye: Secondary | ICD-10-CM | POA: Diagnosis not present

## 2016-09-13 DIAGNOSIS — H2512 Age-related nuclear cataract, left eye: Secondary | ICD-10-CM | POA: Diagnosis not present

## 2016-09-13 DIAGNOSIS — H25812 Combined forms of age-related cataract, left eye: Secondary | ICD-10-CM | POA: Diagnosis not present

## 2016-09-29 ENCOUNTER — Other Ambulatory Visit: Payer: Self-pay | Admitting: Internal Medicine

## 2016-09-29 DIAGNOSIS — N521 Erectile dysfunction due to diseases classified elsewhere: Secondary | ICD-10-CM

## 2016-09-29 DIAGNOSIS — E1140 Type 2 diabetes mellitus with diabetic neuropathy, unspecified: Principal | ICD-10-CM

## 2016-09-29 MED FILL — LISINOPRIL 10 MG TABLET: 10 | 90 days supply | Qty: 90 | Fill #1

## 2016-09-29 MED FILL — METFORMIN HCL ER 500 MG TAB: 500 | 90 days supply | Qty: 270 | Fill #0

## 2016-10-12 MED FILL — ETODOLAC 400 MG TABLET: 400 | 30 days supply | Qty: 60 | Fill #4

## 2016-10-16 DIAGNOSIS — H2511 Age-related nuclear cataract, right eye: Secondary | ICD-10-CM | POA: Diagnosis not present

## 2016-10-18 DIAGNOSIS — H2511 Age-related nuclear cataract, right eye: Secondary | ICD-10-CM | POA: Diagnosis not present

## 2016-10-18 DIAGNOSIS — H25811 Combined forms of age-related cataract, right eye: Secondary | ICD-10-CM | POA: Diagnosis not present

## 2016-10-23 ENCOUNTER — Telehealth: Payer: Self-pay | Admitting: *Deleted

## 2016-10-23 DIAGNOSIS — G8929 Other chronic pain: Secondary | ICD-10-CM

## 2016-10-23 DIAGNOSIS — M545 Low back pain: Principal | ICD-10-CM

## 2016-10-23 NOTE — Telephone Encounter (Signed)
Per patient's wife,  pt went in to have cataract removed last week when the physician removing the cataract was called to assist another patient that was having an medical emergency. Pt had to lay flat on his back for over 2 hours on a metal bed waiting for the physician to return to the room.  Pt is now complaining of back pain and is requesting a rx for flexeril.  Will forward info to pcp for review.  Please advise.Tyler Bridges, [name]6/18/20182:18 PM

## 2016-10-24 DIAGNOSIS — M9901 Segmental and somatic dysfunction of cervical region: Secondary | ICD-10-CM | POA: Diagnosis not present

## 2016-10-24 DIAGNOSIS — M62830 Muscle spasm of back: Secondary | ICD-10-CM | POA: Diagnosis not present

## 2016-10-24 DIAGNOSIS — M9903 Segmental and somatic dysfunction of lumbar region: Secondary | ICD-10-CM | POA: Diagnosis not present

## 2016-10-24 DIAGNOSIS — M545 Low back pain: Secondary | ICD-10-CM | POA: Diagnosis not present

## 2016-10-24 MED ORDER — CYCLOBENZAPRINE HCL 10 MG PO TABS: 10.0000 mg | ORAL_TABLET | Freq: Three times a day (TID) | ORAL | 1 refills | Status: DC | PRN

## 2016-10-24 MED FILL — CYCLOBENZAPRINE 10 MG TAB: 10 | 10 days supply | Qty: 30 | Fill #0

## 2016-10-25 ENCOUNTER — Telehealth: Payer: Self-pay | Admitting: *Deleted

## 2016-10-25 ENCOUNTER — Telehealth (INDEPENDENT_AMBULATORY_CARE_PROVIDER_SITE_OTHER): Payer: Self-pay | Admitting: Orthopaedic Surgery

## 2016-10-25 DIAGNOSIS — M62830 Muscle spasm of back: Secondary | ICD-10-CM | POA: Diagnosis not present

## 2016-10-25 DIAGNOSIS — M9901 Segmental and somatic dysfunction of cervical region: Secondary | ICD-10-CM | POA: Diagnosis not present

## 2016-10-25 DIAGNOSIS — M545 Low back pain: Secondary | ICD-10-CM | POA: Diagnosis not present

## 2016-10-25 DIAGNOSIS — M9903 Segmental and somatic dysfunction of lumbar region: Secondary | ICD-10-CM | POA: Diagnosis not present

## 2016-10-25 NOTE — Telephone Encounter (Signed)
Patients wife calling to request a copy of patients back xray. Patient needs it by Monday. Please call when ready to pick up. Wifes work number, if calling during the day [phone]

## 2016-10-25 NOTE — Telephone Encounter (Addendum)
PA information was sent to CoverMyMeds for Cyclobenzaprine. Awaiting decision within 72 hours.  Angelina OkGladys Sherrelle Prochazka, RN 10/25/2016 4:24 AM Prior Authorization was approved 10/26/2016 thru 05/07/2017 .  Angelina OkGladys Kayci Belleville, RN 10/26/2016 Angelina OkGladys Tiane Szydlowski, RN 10/26/2016 4:51 PM.

## 2016-10-26 DIAGNOSIS — M62830 Muscle spasm of back: Secondary | ICD-10-CM | POA: Diagnosis not present

## 2016-10-26 DIAGNOSIS — M545 Low back pain: Secondary | ICD-10-CM | POA: Diagnosis not present

## 2016-10-26 DIAGNOSIS — M9903 Segmental and somatic dysfunction of lumbar region: Secondary | ICD-10-CM | POA: Diagnosis not present

## 2016-10-26 DIAGNOSIS — M9901 Segmental and somatic dysfunction of cervical region: Secondary | ICD-10-CM | POA: Diagnosis not present

## 2016-10-26 NOTE — Telephone Encounter (Signed)
[facility] called pt

## 2016-10-26 NOTE — Telephone Encounter (Signed)
I called patient, no recent x-rays of back.

## 2016-10-30 DIAGNOSIS — M545 Low back pain: Secondary | ICD-10-CM | POA: Diagnosis not present

## 2016-10-30 DIAGNOSIS — M62830 Muscle spasm of back: Secondary | ICD-10-CM | POA: Diagnosis not present

## 2016-10-30 DIAGNOSIS — M9903 Segmental and somatic dysfunction of lumbar region: Secondary | ICD-10-CM | POA: Diagnosis not present

## 2016-10-30 DIAGNOSIS — M9901 Segmental and somatic dysfunction of cervical region: Secondary | ICD-10-CM | POA: Diagnosis not present

## 2016-11-01 DIAGNOSIS — M545 Low back pain: Secondary | ICD-10-CM | POA: Diagnosis not present

## 2016-11-01 DIAGNOSIS — M9901 Segmental and somatic dysfunction of cervical region: Secondary | ICD-10-CM | POA: Diagnosis not present

## 2016-11-01 DIAGNOSIS — M62830 Muscle spasm of back: Secondary | ICD-10-CM | POA: Diagnosis not present

## 2016-11-01 DIAGNOSIS — M9903 Segmental and somatic dysfunction of lumbar region: Secondary | ICD-10-CM | POA: Diagnosis not present

## 2016-11-02 DIAGNOSIS — M545 Low back pain: Secondary | ICD-10-CM | POA: Diagnosis not present

## 2016-11-02 DIAGNOSIS — M9901 Segmental and somatic dysfunction of cervical region: Secondary | ICD-10-CM | POA: Diagnosis not present

## 2016-11-02 DIAGNOSIS — M62830 Muscle spasm of back: Secondary | ICD-10-CM | POA: Diagnosis not present

## 2016-11-02 DIAGNOSIS — M9903 Segmental and somatic dysfunction of lumbar region: Secondary | ICD-10-CM | POA: Diagnosis not present

## 2016-11-05 DIAGNOSIS — K56609 Unspecified intestinal obstruction, unspecified as to partial versus complete obstruction: Secondary | ICD-10-CM

## 2016-11-05 HISTORY — DX: Unspecified intestinal obstruction, unspecified as to partial versus complete obstruction: K56.609

## 2016-11-06 DIAGNOSIS — M9901 Segmental and somatic dysfunction of cervical region: Secondary | ICD-10-CM | POA: Diagnosis not present

## 2016-11-06 DIAGNOSIS — M9903 Segmental and somatic dysfunction of lumbar region: Secondary | ICD-10-CM | POA: Diagnosis not present

## 2016-11-06 DIAGNOSIS — M62830 Muscle spasm of back: Secondary | ICD-10-CM | POA: Diagnosis not present

## 2016-11-06 DIAGNOSIS — M545 Low back pain: Secondary | ICD-10-CM | POA: Diagnosis not present

## 2016-11-13 ENCOUNTER — Emergency Department (HOSPITAL_BASED_OUTPATIENT_CLINIC_OR_DEPARTMENT_OTHER): Payer: PPO

## 2016-11-13 ENCOUNTER — Encounter (HOSPITAL_BASED_OUTPATIENT_CLINIC_OR_DEPARTMENT_OTHER): Payer: Self-pay | Admitting: Emergency Medicine

## 2016-11-13 ENCOUNTER — Inpatient Hospital Stay (HOSPITAL_BASED_OUTPATIENT_CLINIC_OR_DEPARTMENT_OTHER)
Admission: EM | Admit: 2016-11-13 | Discharge: 2016-11-15 | DRG: 389 | Disposition: A | Discharge status: Home / Self Care | Payer: PPO | Attending: Internal Medicine | Admitting: Internal Medicine

## 2016-11-13 ENCOUNTER — Inpatient Hospital Stay (HOSPITAL_BASED_OUTPATIENT_CLINIC_OR_DEPARTMENT_OTHER): Payer: PPO

## 2016-11-13 ENCOUNTER — Inpatient Hospital Stay (HOSPITAL_COMMUNITY): Payer: PPO

## 2016-11-13 DIAGNOSIS — E785 Hyperlipidemia, unspecified: Secondary | ICD-10-CM | POA: Diagnosis not present

## 2016-11-13 DIAGNOSIS — I10 Essential (primary) hypertension: Secondary | ICD-10-CM | POA: Diagnosis not present

## 2016-11-13 DIAGNOSIS — K56609 Unspecified intestinal obstruction, unspecified as to partial versus complete obstruction: Secondary | ICD-10-CM | POA: Diagnosis not present

## 2016-11-13 DIAGNOSIS — N529 Male erectile dysfunction, unspecified: Secondary | ICD-10-CM | POA: Diagnosis not present

## 2016-11-13 DIAGNOSIS — N521 Erectile dysfunction due to diseases classified elsewhere: Secondary | ICD-10-CM | POA: Diagnosis present

## 2016-11-13 DIAGNOSIS — G8929 Other chronic pain: Secondary | ICD-10-CM | POA: Diagnosis not present

## 2016-11-13 DIAGNOSIS — Z7982 Long term (current) use of aspirin: Secondary | ICD-10-CM

## 2016-11-13 DIAGNOSIS — Z885 Allergy status to narcotic agent status: Secondary | ICD-10-CM

## 2016-11-13 DIAGNOSIS — Z8261 Family history of arthritis: Secondary | ICD-10-CM

## 2016-11-13 DIAGNOSIS — Z79899 Other long term (current) drug therapy: Secondary | ICD-10-CM | POA: Diagnosis not present

## 2016-11-13 DIAGNOSIS — Z8349 Family history of other endocrine, nutritional and metabolic diseases: Secondary | ICD-10-CM

## 2016-11-13 DIAGNOSIS — E872 Acidosis: Secondary | ICD-10-CM | POA: Diagnosis present

## 2016-11-13 DIAGNOSIS — K4090 Unilateral inguinal hernia, without obstruction or gangrene, not specified as recurrent: Secondary | ICD-10-CM | POA: Diagnosis not present

## 2016-11-13 DIAGNOSIS — M545 Low back pain: Secondary | ICD-10-CM | POA: Diagnosis present

## 2016-11-13 DIAGNOSIS — Z9049 Acquired absence of other specified parts of digestive tract: Secondary | ICD-10-CM | POA: Diagnosis not present

## 2016-11-13 DIAGNOSIS — E1165 Type 2 diabetes mellitus with hyperglycemia: Secondary | ICD-10-CM | POA: Diagnosis present

## 2016-11-13 DIAGNOSIS — E669 Obesity, unspecified: Secondary | ICD-10-CM | POA: Diagnosis present

## 2016-11-13 DIAGNOSIS — Z808 Family history of malignant neoplasm of other organs or systems: Secondary | ICD-10-CM

## 2016-11-13 DIAGNOSIS — R1012 Left upper quadrant pain: Secondary | ICD-10-CM | POA: Diagnosis not present

## 2016-11-13 DIAGNOSIS — K56600 Partial intestinal obstruction, unspecified as to cause: Principal | ICD-10-CM | POA: Diagnosis present

## 2016-11-13 DIAGNOSIS — K219 Gastro-esophageal reflux disease without esophagitis: Secondary | ICD-10-CM | POA: Diagnosis not present

## 2016-11-13 DIAGNOSIS — Z7984 Long term (current) use of oral hypoglycemic drugs: Secondary | ICD-10-CM

## 2016-11-13 DIAGNOSIS — Z833 Family history of diabetes mellitus: Secondary | ICD-10-CM | POA: Diagnosis not present

## 2016-11-13 DIAGNOSIS — Z9889 Other specified postprocedural states: Secondary | ICD-10-CM | POA: Diagnosis not present

## 2016-11-13 DIAGNOSIS — Z888 Allergy status to other drugs, medicaments and biological substances status: Secondary | ICD-10-CM

## 2016-11-13 DIAGNOSIS — Z87891 Personal history of nicotine dependence: Secondary | ICD-10-CM

## 2016-11-13 DIAGNOSIS — D72829 Elevated white blood cell count, unspecified: Secondary | ICD-10-CM | POA: Diagnosis present

## 2016-11-13 DIAGNOSIS — Z811 Family history of alcohol abuse and dependence: Secondary | ICD-10-CM | POA: Diagnosis not present

## 2016-11-13 DIAGNOSIS — Z4682 Encounter for fitting and adjustment of non-vascular catheter: Secondary | ICD-10-CM | POA: Diagnosis not present

## 2016-11-13 DIAGNOSIS — Z823 Family history of stroke: Secondary | ICD-10-CM | POA: Diagnosis not present

## 2016-11-13 DIAGNOSIS — E119 Type 2 diabetes mellitus without complications: Secondary | ICD-10-CM | POA: Diagnosis present

## 2016-11-13 DIAGNOSIS — E1140 Type 2 diabetes mellitus with diabetic neuropathy, unspecified: Secondary | ICD-10-CM | POA: Diagnosis present

## 2016-11-13 DIAGNOSIS — Z8249 Family history of ischemic heart disease and other diseases of the circulatory system: Secondary | ICD-10-CM

## 2016-11-13 LAB — COMPREHENSIVE METABOLIC PANEL
ALBUMIN: 4.9 g/dL (ref 3.5–5.0)
ALK PHOS: 76 U/L (ref 38–126)
ALT: 20 U/L (ref 17–63)
ANION GAP: 12 (ref 5–15)
AST: 23 U/L (ref 15–41)
BILIRUBIN TOTAL: 1 mg/dL (ref 0.3–1.2)
BUN: 23 mg/dL — AB (ref 6–20)
CALCIUM: 9.3 mg/dL (ref 8.9–10.3)
CO2: 20 mmol/L — AB (ref 22–32)
CREATININE: 0.86 mg/dL (ref 0.61–1.24)
Chloride: 103 mmol/L (ref 101–111)
GFR calc Af Amer: >60 mL/min (ref >60)
GFR calc non Af Amer: >60 mL/min (ref >60)
GLUCOSE: 316 mg/dL — AB (ref 65–99)
Potassium: 4.7 mmol/L (ref 3.5–5.1)
SODIUM: 135 mmol/L (ref 135–145)
TOTAL PROTEIN: 8.5 g/dL — AB (ref 6.5–8.1)

## 2016-11-13 LAB — CBC WITH DIFFERENTIAL/PLATELET
BASOS PCT: 0 %
Basophils Absolute: 0 10*3/uL (ref 0.0–0.1)
EOS ABS: 0 10*3/uL (ref 0.0–0.7)
Eosinophils Relative: 0 %
HEMATOCRIT: 45.6 % (ref 39.0–52.0)
Hemoglobin: 15.7 g/dL (ref 13.0–17.0)
Lymphocytes Relative: 8 %
Lymphs Abs: 1.6 10*3/uL (ref 0.7–4.0)
MCH: 28.8 pg (ref 26.0–34.0)
MCHC: 34.4 g/dL (ref 30.0–36.0)
MCV: 83.7 fL (ref 78.0–100.0)
MONOS PCT: 9 %
Monocytes Absolute: 1.7 10*3/uL — ABNORMAL HIGH (ref 0.1–1.0)
NEUTROS ABS: 15.8 10*3/uL — AB (ref 1.7–7.7)
NEUTROS PCT: 83 %
Platelets: 286 10*3/uL (ref 150–400)
RBC: 5.45 MIL/uL (ref 4.22–5.81)
RDW: 14.1 % (ref 11.5–15.5)
WBC: 19.1 10*3/uL — AB (ref 4.0–10.5)

## 2016-11-13 LAB — URINALYSIS, MICROSCOPIC (REFLEX): RBC / HPF: NONE SEEN RBC/hpf (ref 0–5)

## 2016-11-13 LAB — URINALYSIS, ROUTINE W REFLEX MICROSCOPIC
Glucose, UA: >=500 mg/dL — AB
HGB URINE DIPSTICK: NEGATIVE
KETONES UR: 15 mg/dL — AB
Leukocytes, UA: NEGATIVE
NITRITE: NEGATIVE
PH: 5 (ref 5.0–8.0)
Protein, ur: NEGATIVE mg/dL

## 2016-11-13 LAB — LIPASE, BLOOD: Lipase: 35 U/L (ref 11–51)

## 2016-11-13 LAB — GLUCOSE, CAPILLARY
GLUCOSE-CAPILLARY: 172 mg/dL — AB (ref 65–99)
Glucose-Capillary: 144 mg/dL — ABNORMAL HIGH (ref 65–99)

## 2016-11-13 MED ORDER — ONDANSETRON HCL 4 MG/2ML IJ SOLN: 4.0000 mg | Freq: Four times a day (QID) | INTRAMUSCULAR | Status: DC | PRN

## 2016-11-13 MED ORDER — LIDOCAINE HCL 2 % EX GEL
1.0000 "application " | Freq: Once | CUTANEOUS | Status: AC
  Administered 2016-11-13: 1
  Filled 2016-11-13: qty 20

## 2016-11-13 MED ORDER — SODIUM CHLORIDE 0.9 % IV BOLUS (SEPSIS)
1000.0000 mL | Freq: Once | INTRAVENOUS | Status: AC
  Administered 2016-11-13: 1000 mL via INTRAVENOUS

## 2016-11-13 MED ORDER — POTASSIUM CHLORIDE 2 MEQ/ML IV SOLN
INTRAVENOUS | Status: AC
  Administered 2016-11-13 – 2016-11-14 (×2): via INTRAVENOUS
  Filled 2016-11-13 (×3): qty 1000

## 2016-11-13 MED ORDER — MORPHINE SULFATE (PF) 4 MG/ML IV SOLN
4.0000 mg | Freq: Once | INTRAVENOUS | Status: AC
  Administered 2016-11-13: 4 mg via INTRAVENOUS
  Filled 2016-11-13: qty 1

## 2016-11-13 MED ORDER — MORPHINE SULFATE (PF) 4 MG/ML IV SOLN
2.0000 mg | INTRAVENOUS | Status: DC | PRN
  Administered 2016-11-13 – 2016-11-14 (×4): 2 mg via INTRAVENOUS
  Filled 2016-11-13 (×4): qty 1

## 2016-11-13 MED ORDER — ONDANSETRON HCL 4 MG/2ML IJ SOLN
4.0000 mg | Freq: Once | INTRAMUSCULAR | Status: AC
  Administered 2016-11-13: 4 mg via INTRAVENOUS
  Filled 2016-11-13: qty 2

## 2016-11-13 MED ORDER — INSULIN ASPART 100 UNIT/ML ~~LOC~~ SOLN
0.0000 [IU] | SUBCUTANEOUS | Status: DC
  Administered 2016-11-13: 2 [IU] via SUBCUTANEOUS
  Administered 2016-11-13: 3 [IU] via SUBCUTANEOUS
  Administered 2016-11-13 – 2016-11-15 (×7): 2 [IU] via SUBCUTANEOUS
  Administered 2016-11-15: 3 [IU] via SUBCUTANEOUS

## 2016-11-13 MED ORDER — ENOXAPARIN SODIUM 40 MG/0.4ML ~~LOC~~ SOLN
40.0000 mg | SUBCUTANEOUS | Status: DC
  Administered 2016-11-13 – 2016-11-14 (×2): 40 mg via SUBCUTANEOUS
  Filled 2016-11-13 (×2): qty 0.4

## 2016-11-13 MED ORDER — IOPAMIDOL (ISOVUE-300) INJECTION 61%
100.0000 mL | Freq: Once | INTRAVENOUS | Status: AC | PRN
  Administered 2016-11-13: 100 mL via INTRAVENOUS

## 2016-11-13 MED ORDER — DIATRIZOATE MEGLUMINE & SODIUM 66-10 % PO SOLN
90.0000 mL | Freq: Once | ORAL | Status: AC
  Administered 2016-11-13: 90 mL via NASOGASTRIC
  Filled 2016-11-13: qty 90

## 2016-11-13 MED ORDER — SODIUM CHLORIDE 0.9 % IV SOLN
INTRAVENOUS | Status: DC
  Administered 2016-11-13: 12:00:00 via INTRAVENOUS

## 2016-11-13 NOTE — ED Notes (Signed)
Drinking contrast.

## 2016-11-13 NOTE — ED Triage Notes (Signed)
Pt c/o abdominal pain LLQ "I think I have a bowel obstruction bc when I eat I throw up". Symptoms started last night around 10 pm. Nausea and Vomiting denies diarrhea or fever. A/O   EDP now at bedside.

## 2016-11-13 NOTE — Progress Notes (Signed)
Patient arrived 6n16, alert and oriented, able to ambulate from stretcher to bed. NG tube present on admission, I hooked up to suction per orders, IV intact with NS running. Patient in moderate pain, no orders at the moment. I let medicine team know he was here, will continue to monitor. Oriented to room and staff, no family at the moment per patient they are coming.
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
## 2016-11-13 NOTE — ED Notes (Signed)
[facility] present at bedside for transport.
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
## 2016-11-13 NOTE — Progress Notes (Signed)
Paged family medicine and let them know patient was here.
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
## 2016-11-13 NOTE — ED Notes (Signed)
Report Attempted calling back in 15 mins    Portable XRAY at bedside to confirm placement.

## 2016-11-13 NOTE — ED Notes (Signed)
Fall risk bracelet placed on patient along with yellow socks

## 2016-11-14 ENCOUNTER — Encounter (HOSPITAL_COMMUNITY): Payer: Self-pay | Admitting: General Practice

## 2016-11-14 ENCOUNTER — Inpatient Hospital Stay (HOSPITAL_COMMUNITY): Payer: PPO

## 2016-11-14 DIAGNOSIS — K56609 Unspecified intestinal obstruction, unspecified as to partial versus complete obstruction: Secondary | ICD-10-CM

## 2016-11-14 LAB — CBC WITH DIFFERENTIAL/PLATELET
Basophils Absolute: 0 10*3/uL (ref 0.0–0.1)
Basophils Relative: 0 %
EOS ABS: 0.2 10*3/uL (ref 0.0–0.7)
Eosinophils Relative: 2 %
HCT: 39.9 % (ref 39.0–52.0)
Hemoglobin: 12.8 g/dL — ABNORMAL LOW (ref 13.0–17.0)
Lymphocytes Relative: 34 %
Lymphs Abs: 3 10*3/uL (ref 0.7–4.0)
MCH: 27.9 pg (ref 26.0–34.0)
MCHC: 32.1 g/dL (ref 30.0–36.0)
MCV: 86.9 fL (ref 78.0–100.0)
Monocytes Absolute: 0.8 10*3/uL (ref 0.1–1.0)
Monocytes Relative: 9 %
NEUTROS PCT: 55 %
Neutro Abs: 4.7 10*3/uL (ref 1.7–7.7)
Platelets: 230 10*3/uL (ref 150–400)
RBC: 4.59 MIL/uL (ref 4.22–5.81)
RDW: 14 % (ref 11.5–15.5)
WBC: 8.7 10*3/uL (ref 4.0–10.5)

## 2016-11-14 LAB — BASIC METABOLIC PANEL
ANION GAP: 7 (ref 5–15)
BUN: 11 mg/dL (ref 6–20)
CALCIUM: 8.4 mg/dL — AB (ref 8.9–10.3)
CO2: 25 mmol/L (ref 22–32)
Chloride: 107 mmol/L (ref 101–111)
Creatinine, Ser: 0.7 mg/dL (ref 0.61–1.24)
GFR calc non Af Amer: >60 mL/min (ref >60)
Glucose, Bld: 128 mg/dL — ABNORMAL HIGH (ref 65–99)
POTASSIUM: 3.9 mmol/L (ref 3.5–5.1)
Sodium: 139 mmol/L (ref 135–145)

## 2016-11-14 LAB — GLUCOSE, CAPILLARY
GLUCOSE-CAPILLARY: 127 mg/dL — AB (ref 65–99)
GLUCOSE-CAPILLARY: 142 mg/dL — AB (ref 65–99)
Glucose-Capillary: 128 mg/dL — ABNORMAL HIGH (ref 65–99)
Glucose-Capillary: 114 mg/dL — ABNORMAL HIGH (ref 65–99)

## 2016-11-14 LAB — HEMOGLOBIN A1C
HEMOGLOBIN A1C: 7.3 % — AB (ref 4.8–5.6)
Mean Plasma Glucose: 163 mg/dL

## 2016-11-14 MED ORDER — PHENOL 1.4 % MT LIQD
1.0000 | OROMUCOSAL | Status: DC | PRN
  Administered 2016-11-14: 1 via OROMUCOSAL
  Filled 2016-11-14: qty 177

## 2016-11-14 MED ORDER — DIPHENHYDRAMINE HCL 25 MG PO CAPS
25.0000 mg | ORAL_CAPSULE | Freq: Every evening | ORAL | Status: DC | PRN
  Administered 2016-11-14: 25 mg via ORAL
  Filled 2016-11-14: qty 1
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
## 2016-11-14 NOTE — Progress Notes (Signed)
Patient ID: Tyler FreerCharles L Bridges, male   DOB: [birth year], 71 y.o.   MRN: [id number]   Subjective: Tyler Bridges was feeling a little better this morning. He states that he was able have a Bowl Movement last night. Suction was disconnected and fluid in collection chamber was clear-yellow watery stomach contents. He was informed that the surgery team would likely be in to see him this morning as well. He states he was able to eat ice chips today. He mentioned that he is aware of some food triggers (such as steak and peanuts) for his recurrent SBO and knows he needs to stay away from these.   Objective:  Vital signs in last 24 hours: Vitals:   11/13/16 0944 11/13/16 1113 11/13/16 2151 11/14/16 0500  BP: (!) 151/87 (!) 143/64 137/72 123/69  Pulse: 85 78 79 71  Resp: 15 16 16 17   Temp:  98.3 F (36.8 C) (!) 97.5 F (36.4 C) 98.7 F (37.1 C)  TempSrc:  Oral Oral Oral  SpO2: 96% 96% 95% 93%  Weight:  210 lb (95.3 kg)    Height:  5\' 8"  (1.727 m)     Physical Exam  Constitutional: He is oriented to person, place, and time. He appears well-developed and well-nourished.  Eyes: EOM are normal. No scleral icterus.  Cardiovascular: Normal rate and regular rhythm.  Exam reveals no friction rub.   No murmur heard. Pulmonary/Chest: Effort normal and breath sounds normal. No respiratory distress.  Abdominal: Bowel sounds are normal. There is no tenderness.  Mildly distended abdomen  Musculoskeletal: He exhibits no tenderness or deformity.  Neurological: He is alert and oriented to person, place, and time.  Skin: Skin is warm and dry.   CBC Latest Ref Rng & Units 11/14/2016 11/13/2016 01/17/2015  WBC 4.0 - 10.5 K/uL 8.7 19.1(H) 10.5  Hemoglobin 13.0 - 17.0 g/dL 12.8(L) 15.7 14.2  Hematocrit 39.0 - 52.0 % 39.9 45.6 40.6  Platelets 150 - 400 K/uL 230 286 263    Assessment & Plan by Problem:  SBO vs Ileus: Presented with signs, symptoms and radiographic evidence of SBO vs ileus.  Leukocytosis on admission has  resolved.   - IVF Lactated Ringers w/ 20mEq KCl @ 100cc/hr - Pain control 2mg  Q4H as needed - Appreciate Surgery Recommendations:  - D/C NG tube   - Diet Advanced to clear liquid - Oral care per RN - Monitor electrolytes, fever curve, WBC trend  HTN: BP 123/63.  Home medications include lisinopril 20mg  daily - Resume home meds once tolerating PO  HLD: Lipid panel in April 2018 with TC 186, HDL 31, LDL 95.  10-year ASCVD risk of 42.8%.  PCP has recommended moderate-high intensity statin based upon this.  Patient thus far has not wanted to initiate therapy and is not currently on statin. - Discuss with patient starting statin medication to lower his risk of cardiovascular event  Type 2 DM: Glucose of 316 on admission.  Last A1c in April 2018 of 7.4.  Home medications include metformin XR 1500mg  daily. - CBG and SSI Q4H while NPO  - Advance with diet - A1c  GERD: Home medication includes pantoprazole 40mg  BID. - Pantoprazole 40mg  IV.  Transition to PO once tolerating diet  FEN Fluids: Lactated ringers 100cc/hr with 20mEq KCl Electrolytes: Giving K with IV fluids Nutrition: Clear liquid, SBO protocol per General Surgery  DVT PPx: Lovenox  CODE: FULL   Dispo: Anticipated discharge in approximately 1-2 day(s).   Beola CordAlexander Melvin, DO IM PGY-1 Pager: [phone]
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
## 2016-11-15 LAB — GLUCOSE, CAPILLARY
GLUCOSE-CAPILLARY: 162 mg/dL — AB (ref 65–99)
Glucose-Capillary: 112 mg/dL — ABNORMAL HIGH (ref 65–99)
Glucose-Capillary: 122 mg/dL — ABNORMAL HIGH (ref 65–99)
Glucose-Capillary: 124 mg/dL — ABNORMAL HIGH (ref 65–99)
Glucose-Capillary: 160 mg/dL — ABNORMAL HIGH (ref 65–99)

## 2016-11-15 MED ORDER — METFORMIN HCL ER 500 MG PO TB24
1000.0000 mg | ORAL_TABLET | Freq: Every day | ORAL | Status: DC
  Administered 2016-11-15: 1000 mg via ORAL
  Filled 2016-11-15: qty 2

## 2016-11-15 MED ORDER — CYCLOBENZAPRINE HCL 10 MG PO TABS: 10.0000 mg | ORAL_TABLET | Freq: Three times a day (TID) | ORAL | Status: DC | PRN

## 2016-11-15 MED ORDER — PANTOPRAZOLE SODIUM 40 MG PO TBEC
40.0000 mg | DELAYED_RELEASE_TABLET | Freq: Two times a day (BID) | ORAL | Status: DC
  Administered 2016-11-15: 40 mg via ORAL
  Filled 2016-11-15: qty 1

## 2016-11-15 MED ORDER — METFORMIN HCL ER 500 MG PO TB24: 1500.0000 mg | ORAL_TABLET | Freq: Every day | ORAL | Status: DC

## 2016-11-15 NOTE — Care Management Note (Signed)
Case Management Note  Patient Details  Name: Tyler Bridges MRN: [id number] Date of Birth: 04-22-1946  Subjective/Objective:                    Action/Plan: Pt discharging home with self care. Pt has PCP, insurance, and transportation home. No further needs per CM.   Expected Discharge Date:  11/15/16               Expected Discharge Plan:  Home/Self Care  In-House Referral:     Discharge planning Services     Post Acute Care Choice:    Choice offered to:     DME Arranged:    DME Agency:     HH Arranged:    HH Agency:     Status of Service:  Completed, signed off  If discussed at [employer] Length of Stay Meetings, dates discussed:    Additional Comments:  Kermit BaloKelli F Jarom Govan, RN 11/15/2016, 3:29 PM
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
## 2016-11-15 NOTE — Progress Notes (Signed)
  Date: 11/15/2016  Patient name: Tyler Bridges  Medical record number: [id number]  Date of birth: 1945-10-18   I have seen and evaluated this patient and I have discussed the plan of care with the house staff. Please see their note for complete details. I concur with their findings with the following additions/corrections: Partial SBO resolved. + BM, + gas, tolerating soft diet, 24 hr abd film nl. Stable for D/C home.  Burns SpainButcher, Elizabeth A, MD 11/15/2016, 1:24 PM

## 2016-11-15 NOTE — Progress Notes (Signed)
Patient ID: Cassie FreerCharles L Burks, male   DOB: [birth year], 71 y.o.   MRN: [id number]   Subjective: [name] continues to improve. He states he is ready to go home. He endorses 3 bowl movements in last day. He denies nausea, vomiting or pain. His NG tube was pulled yesterday and he was started on a clear liquid diet, which he as tolerated well.  Objective:  Vital signs in last 24 hours: Vitals:   11/14/16 0500 11/14/16 1306 11/14/16 2147 11/15/16 0543  BP: 123/69 126/63 135/66 118/61  Pulse: 71 75 78 68  Resp: 17  18 16   Temp: 98.7 F (37.1 C) 98.5 F (36.9 C) 98.3 F (36.8 C) 98.9 F (37.2 C)  TempSrc: Oral Oral Oral Oral  SpO2: 93% 94% 97% 98%  Weight:      Height:       Physical Exam  Constitutional: He is oriented to person, place, and time. He appears well-developed and well-nourished.  Eyes: EOM are normal. No scleral icterus.  Cardiovascular: Normal rate and regular rhythm.  Exam reveals no friction rub.   No murmur heard. Pulmonary/Chest: Effort normal and breath sounds normal. No respiratory distress.  Abdominal: Soft. Bowel sounds are normal. He exhibits no distension. There is no tenderness.  Mild LUQ tenderness to palpation  Musculoskeletal: He exhibits no tenderness or deformity.  Neurological: He is alert and oriented to person, place, and time.  Skin: Skin is warm and dry.   CBC Latest Ref Rng & Units 11/14/2016 11/13/2016 01/17/2015  WBC 4.0 - 10.5 K/uL 8.7 19.1(H) 10.5  Hemoglobin 13.0 - 17.0 g/dL 12.8(L) 15.7 14.2  Hematocrit 39.0 - 52.0 % 39.9 45.6 40.6  Platelets 150 - 400 K/uL 230 286 263    Assessment & Plan by Problem:  SBO vs Ileus: Presented with signs, symptoms and radiographic evidence of SBO vs ileus.  Leukocytosis on admission has resolved.   - Pain control 2mg  Q4H as needed - Appreciate Surgery Recommendations: - Advance diet to Soft as tolerated - Oral care per RN  HTN: BP 123/63.  Home medications include lisinopril 20mg  daily - Resume home meds  on discharge  HLD: Lipid panel in April 2018 with TC 186, HDL 31, LDL 95.  10-year ASCVD risk of 42.8%.  PCP has recommended moderate-high intensity statin based upon this.  Patient thus far has not wanted to initiate therapy and is not currently on statin. - Discuss with patient starting statin medication to lower his risk of cardiovascular event  Type 2 DM: Glucose of 316 on admission.  Last A1c in April 2018 of 7.4.  Home medications include metformin XR 1500mg  daily. - Home meds resumed - SSI  GERD: Home medication includes pantoprazole 40mg  BID. - Resume home meds  FEN Fluids: PO Electrolytes: Giving K with IV fluids Nutrition: Clear liquid Advance as tolerated, SBO protocol per General Surgery  DVT PPx: Lovenox  CODE: FULL   Dispo: Anticipated discharge later today  Beola CordAlexander Melvin, DO IM PGY-1 Pager: [phone]

## 2016-11-15 NOTE — Progress Notes (Signed)
Patient discharged with instructions.

## 2016-11-15 NOTE — Consult Note (Signed)
[facility] CM Primary Care Navigator  11/15/2016  Tyler Bridges Jul 02, 1945 [id number]   Met with patient at the bedside to identify possible discharge needs. Patient reports having extreme abdominal pain, swelling with constipation that had led to this admission. Patient [facility].    Patient shared using [facility]) and [facility] pharmacy in [place] to obtain medications without difficulty.   Patient reports that wife Tyler Bridges- [employer] employee) manages his medications at home using "pill box" system filled weekly.  Patientreports being able to drive prior to admission but wife can provide transportationto hisdoctors'appointments after discharge if needed.  His wife is the primary caregiver at home as stated.  Anticipated discharge plan is home per patient.  Patient expressedunderstanding to call primary care provider's officewhen he returns home,for a post discharge follow-up appointment with in a week or sooner if needs arise.Patient letter (with PCP's contact number) was provided as a reminder.  Explained to patient about [facility] CM services available for healthmanagement and patientdenied any current needs or concerns at this time.  He verbalized awareness of ways inmanagingDM with help of wife and monitoring by his primary care provider, with recent A1C of 7.3.    Patient voiced understanding to seek referral to [facility] care managementfrom primary care provider if deemed necessary and appropriate for servicesin the nearfuture.  [facility] care management information provided for future needs that may arise.  Patient had opted and verbally agreed to [place] to follow- up with him at home as he recovers. Referral to [place] calls made.   For questions, please contact:  Dannielle Huh, BSN, RN- [facility] Primary Care Navigator   Telephone: [phone] [place]

## 2016-11-15 NOTE — Discharge Summary (Signed)
Name: Tyler Bridges MRN: [id number] DOB: 06-21-1945 71 y.o. 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   BLUE-EMU SUPER STRENGTH EX Apply 1 application topically as needed (for pain).   cyclobenzaprine 10 MG tablet Commonly known as:  FLEXERIL Take 1 tablet (10 mg total) by mouth 3 (three) times daily as needed for muscle spasms.   etodolac 400 MG tablet Commonly known as:  LODINE Take 1 tablet (400 mg total) by mouth every 12 (twelve) hours as needed for moderate pain.   glucose blood test strip Commonly known as:  ONE TOUCH ULTRA TEST Use as instructed to check blood sugar once daily. Diag code E11.9 (Non-insulin dependent)   lisinopril 20 MG tablet Commonly known as:  PRINIVIL,ZESTRIL Take 1 tablet (20 mg total) by mouth daily. What changed:  how much to take   metFORMIN 500 MG 24 hr tablet Commonly known as:  GLUCOPHAGE-XR TAKE 3 TABLETS BY MOUTH DAILY. What changed:  See the new instructions.   pantoprazole 40 MG tablet Commonly known as:  PROTONIX Take 1 tablet (40 mg total) by mouth 2 (two) times daily. What changed:  how much to take  when to take this       Disposition and follow-up:   Mr.Majid L Minetti was discharged from [facility] in Good condition.  At the hospital follow up visit please  address:  1.  Small Bowl Obstruction: Please follow up with patient on avoidance of known dietary triggers. Monitor for repeat symptoms.  2.  CXR Impression of L basilar atelectasis or infiltrate: CT scan findings read "Lower chest: No acute abnormality." There were also no clinical signs or symptoms concerning for pneumonia.  3.  Labs / imaging needed at time of follow-up: None  4.  Pending labs/ test needing follow-up: None  Follow-up Appointments:   [facility] will [facility] with [facility] appointment.  Hospital Course by problem list:   1. Small Bowl Obstruction: Mr. Bill presented following 1 day of experiencing LLQ pain radiating to his general abdomen and distended abdomen, which progressed to have associated nausea and vomiting. He initially present to [facility] where CT abdomen and pelvis confirmed a moderate proximal small bowel dilatation concerning for SBO or ileus. An NG tube was placed and he was transferred to [facility]. After receiving NG tube, his pain improved and he felt less distended. He was made NPO, NG was placed to intermittent suction, and he was ordered IV Fluids andPRN pain medication. General Surgery was consulted to evaluate. Abdominal film with oral contrast showed "Contrast material is present within the colon. No significantly dilated loops of bowel are seen." He was able to have 1 bowl movement that night. He was clinically improved the following day. His NG tube was pulled and he was advanced  to a clear liquid diet. He tolerated the diet and follow up abdominal film showed "a few distended small bowel loops in the left mid abdomen, nonspecific... ....Interval partial clearance of enteral contrast." He had further bowl movements on the second night. On the morning of discharge he felt much better and pain free with only mild tenderness to palpation of the LUQ. He was advanced to a soft diet, which he tolerated  well.  2. HTN: Home lisinopril held until discharge  3. Type 2 DM: Home Metformin held until day of discharge. Insulin while Hospitalized.   4. GERD: Home medications held until day of discharge.  Discharge Vitals:   BP 126/66 (BP Location: Right Arm)   Pulse 75   Temp 97.6 F (36.4 C) (Oral)   Resp 16   Ht 5\' 8"  (1.727 m)   Wt 210 lb (95.3 kg)   SpO2 99%   BMI 31.93 kg/m   Pertinent Labs, Studies, and Procedures:   CBC: CBC Latest Ref Rng & Units 11/14/2016 11/13/2016 01/17/2015  WBC 4.0 - 10.5 K/uL 8.7 19.1(H) 10.5  Hemoglobin 13.0 - 17.0 g/dL 12.8(L) 15.7 14.2  Hematocrit 39.0 - 52.0 % 39.9 45.6 40.6  Platelets 150 - 400 K/uL 230 286 263   CMP: CMP Latest Ref Rng & Units 11/14/2016 11/13/2016 12/31/2015  Glucose 65 - 99 mg/dL 161(W128(H) 960(A316(H) 540(J138(H)  BUN 6 - 20 mg/dL 11 81(X23(H) 12  Creatinine 0.61 - 1.24 mg/dL 9.140.70 7.820.86 9.56(O0.68(L)  Sodium 135 - 145 mmol/L 139 135 139  Potassium 3.5 - 5.1 mmol/L 3.9 4.7 4.7  Chloride 101 - 111 mmol/L 107 103 100  CO2 22 - 32 mmol/L 25 20(L) 20  Calcium 8.9 - 10.3 mg/dL 1.3(Y8.4(L) 9.3 9.0  Total Protein 6.5 - 8.1 g/dL - 8.5(H) -  Total Bilirubin 0.3 - 1.2 mg/dL - 1.0 -  Alkaline Phos 38 - 126 U/L - 76 -  AST 15 - 41 U/L - 23 -  ALT 17 - 63 U/L - 20 -   Hgb A1C: 7.3 (11/13/16)   CT Abdomen / Pelvis:IMPRESSION: - Mild right-sided fat containing inguinal hernia. - Moderate proximal small bowel dilatation is noted concerning for possible partial small bowel obstruction or ileus. - Aortic atherosclerosis.  CXR: IMPRESSION: - Nasogastric tube tip seen in expected position of proximal stomach. - Mild left basilar subsegmental atelectasis or infiltrate. Followup PA and lateral chest X-ray is recommended in 3-4 weeks following trial of antibiotic therapy to ensure resolution and exclude underlying malignancy.  Abd Xray (7/9): IMPRESSION: - Nasogastric tube into the decompressed stomach.  Abd Xray (7/10 initial): IMPRESSION: - Contrast material is  present within the colon. No significantly dilated loops of bowel are seen  Abd Xray (7/10 follow up): IMPRESSION: - Interval removal of nasogastric tube, with a few distended small bowel loops in the left mid abdomen, nonspecific. - Interval partial clearance of enteral contrast from the decompressed colon.   Discharge Instructions: Discharge Instructions    Diet - low sodium heart healthy    Complete by:  As directed    Discharge instructions    Complete by:  As directed    Your symptoms were determined to be caused by an obstruction of your small bowl. Please try to avoid foods you know to trigger these symptoms. Return to the ED if symptoms return.   Increase activity slowly    Complete by:  As directed      Signed: Beola CordAlexander Yukari Flax, DO IM PGY-1 Pager: [phone]

## 2016-11-21 ENCOUNTER — Ambulatory Visit (INDEPENDENT_AMBULATORY_CARE_PROVIDER_SITE_OTHER): Payer: PPO | Admitting: Internal Medicine

## 2016-11-21 VITALS — BP 125/69 | HR 63 | Wt 207.2 lb

## 2016-11-21 DIAGNOSIS — K56609 Unspecified intestinal obstruction, unspecified as to partial versus complete obstruction: Secondary | ICD-10-CM | POA: Diagnosis not present

## 2016-11-21 DIAGNOSIS — E1149 Type 2 diabetes mellitus with other diabetic neurological complication: Secondary | ICD-10-CM

## 2016-11-21 DIAGNOSIS — N521 Erectile dysfunction due to diseases classified elsewhere: Secondary | ICD-10-CM | POA: Diagnosis not present

## 2016-11-21 DIAGNOSIS — Z7982 Long term (current) use of aspirin: Secondary | ICD-10-CM

## 2016-11-21 DIAGNOSIS — E1140 Type 2 diabetes mellitus with diabetic neuropathy, unspecified: Secondary | ICD-10-CM

## 2016-11-21 DIAGNOSIS — Z8249 Family history of ischemic heart disease and other diseases of the circulatory system: Secondary | ICD-10-CM | POA: Diagnosis not present

## 2016-11-21 DIAGNOSIS — Z87891 Personal history of nicotine dependence: Secondary | ICD-10-CM

## 2016-11-21 DIAGNOSIS — Z7984 Long term (current) use of oral hypoglycemic drugs: Secondary | ICD-10-CM

## 2016-11-21 DIAGNOSIS — Z833 Family history of diabetes mellitus: Secondary | ICD-10-CM

## 2016-11-21 DIAGNOSIS — I10 Essential (primary) hypertension: Secondary | ICD-10-CM

## 2016-11-21 DIAGNOSIS — Z79899 Other long term (current) drug therapy: Secondary | ICD-10-CM

## 2016-11-21 NOTE — Assessment & Plan Note (Signed)
The patient had a recent A1C of 7.3% during hospitalization (11/13/2016). This A1C is at goal of an A1C between 7- 8%.The patient was instructed to continue current medication regimen and follow up with PCP Dr. Josem KaufmannKlima in 2-4 months (when availability permits) for management of chronic medical conditions.
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
## 2016-11-21 NOTE — Assessment & Plan Note (Signed)
BP at goal of <140/90. The patient was instructed to continue current medication regimen and follow up with PCP Dr. Josem KaufmannKlima in 2-4 months (when availability permits) for management of chronic medical conditions.

## 2016-11-21 NOTE — Assessment & Plan Note (Signed)
The patient has a history of multiple SBOs (over 5) that have occurred over the course of many years. Since his recent hospitalization he denies difficulty moving his bowels, passing gas, nausea, vomiting, and worsening abdominal pain. He was not distended or tender on PE exam today. He also had active bowel sounds in multiple quadrants. It seems that his SBO has continued to improve after discharge. The patient was encouraged to continue his dietary efforts to improve his chronic abdominal pain. He was also encouraged to take a short walk after meals to help with post-prandial abdominal pain and to keep his bowels active. The patient was instructed to call clinic should his abdominal pain worsen or if he has difficulty passing gas/bowels.

## 2016-11-21 NOTE — Patient Instructions (Addendum)
Thanks for seeing us today!  Please continue taking your medications as previously prescribed by your PCP.   Please call the clinic or go to the ED if you have worsening abdominal pain that does not resolve, difficulty passing gas, vomiting, abdominal distension, or difficulty having a bowel movement.   Otherwise please follow up with your PCP, Dr. Josem KaufmannKlima, in the Fall (2-4 months).

## 2016-11-21 NOTE — Progress Notes (Signed)
Internal Medicine Clinic Attending  I saw and evaluated the patient.  I personally confirmed the key portions of the history and exam documented by Dr. Nedrud and I reviewed pertinent patient test results.  The assessment, diagnosis, and plan were formulated together and I agree with the documentation in the resident's note.  

## 2016-11-28 ENCOUNTER — Ambulatory Visit: Payer: PPO

## 2016-12-11 ENCOUNTER — Ambulatory Visit (INDEPENDENT_AMBULATORY_CARE_PROVIDER_SITE_OTHER): Payer: PPO | Admitting: Orthopaedic Surgery

## 2016-12-11 ENCOUNTER — Encounter (INDEPENDENT_AMBULATORY_CARE_PROVIDER_SITE_OTHER): Payer: Self-pay | Admitting: Orthopaedic Surgery

## 2016-12-11 VITALS — BP 135/76 | HR 71 | Resp 14 | Ht 67.0 in | Wt 205.0 lb

## 2016-12-11 DIAGNOSIS — M19011 Primary osteoarthritis, right shoulder: Secondary | ICD-10-CM

## 2016-12-11 MED ORDER — LIDOCAINE HCL 1 % IJ SOLN
2.0000 mL | INTRAMUSCULAR | Status: AC | PRN
  Administered 2016-12-11: 2 mL

## 2016-12-11 MED ORDER — METHYLPREDNISOLONE ACETATE 40 MG/ML IJ SUSP
80.0000 mg | INTRAMUSCULAR | Status: AC | PRN
  Administered 2016-12-11: 80 mg

## 2016-12-11 MED ORDER — BUPIVACAINE HCL 0.5 % IJ SOLN
2.0000 mL | INTRAMUSCULAR | Status: AC | PRN
  Administered 2016-12-11: 2 mL via INTRA_ARTICULAR
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
## 2016-12-18 MED FILL — PANTOPRAZOLE SOD DR 40 MG T: 40 | 90 days supply | Qty: 180 | Fill #3

## 2016-12-19 MED FILL — ETODOLAC 400 MG TABLET: 400 | 30 days supply | Qty: 60 | Fill #5

## 2016-12-20 NOTE — Progress Notes (Signed)
done

## 2017-01-22 MED FILL — LISINOPRIL 10 MG TABLET: 10 | 90 days supply | Qty: 90 | Fill #2

## 2017-01-22 MED FILL — METFORMIN HCL ER 500 MG TAB: 500 | 90 days supply | Qty: 270 | Fill #1

## 2017-02-28 MED FILL — HYDROCODON-APAP 5-325: 5-325 | 3 days supply | Qty: 20 | Fill #0

## 2017-03-19 ENCOUNTER — Other Ambulatory Visit: Payer: Self-pay | Admitting: *Deleted

## 2017-03-19 ENCOUNTER — Other Ambulatory Visit: Payer: Self-pay | Admitting: Internal Medicine

## 2017-03-19 DIAGNOSIS — M545 Low back pain: Principal | ICD-10-CM

## 2017-03-19 DIAGNOSIS — G8929 Other chronic pain: Secondary | ICD-10-CM

## 2017-03-20 MED ORDER — DTAP-HEPATITIS B RECOMB-IPV IM SUSP: 0.5000 mL | Freq: Once | INTRAMUSCULAR | Status: DC

## 2017-03-20 MED FILL — ETODOLAC 400 MG TABLET: 400 | 30 days supply | Qty: 60 | Fill #0

## 2017-03-23 NOTE — Telephone Encounter (Signed)
This is complete.

## 2017-04-02 ENCOUNTER — Other Ambulatory Visit: Payer: Self-pay | Admitting: Internal Medicine

## 2017-04-02 DIAGNOSIS — K219 Gastro-esophageal reflux disease without esophagitis: Secondary | ICD-10-CM

## 2017-04-02 MED FILL — PANTOPRAZOLE SOD DR 40 MG T: 40 | 90 days supply | Qty: 180 | Fill #0

## 2017-04-09 ENCOUNTER — Ambulatory Visit (INDEPENDENT_AMBULATORY_CARE_PROVIDER_SITE_OTHER): Payer: PPO | Admitting: Internal Medicine

## 2017-04-09 ENCOUNTER — Encounter: Payer: Self-pay | Admitting: Internal Medicine

## 2017-04-09 VITALS — BP 141/73 | HR 68 | Wt 206.1 lb

## 2017-04-09 DIAGNOSIS — E1140 Type 2 diabetes mellitus with diabetic neuropathy, unspecified: Secondary | ICD-10-CM

## 2017-04-09 DIAGNOSIS — M1712 Unilateral primary osteoarthritis, left knee: Secondary | ICD-10-CM

## 2017-04-09 DIAGNOSIS — N521 Erectile dysfunction due to diseases classified elsewhere: Secondary | ICD-10-CM | POA: Diagnosis not present

## 2017-04-09 DIAGNOSIS — I10 Essential (primary) hypertension: Secondary | ICD-10-CM | POA: Diagnosis not present

## 2017-04-09 DIAGNOSIS — E669 Obesity, unspecified: Secondary | ICD-10-CM | POA: Diagnosis not present

## 2017-04-09 DIAGNOSIS — K219 Gastro-esophageal reflux disease without esophagitis: Secondary | ICD-10-CM

## 2017-04-09 DIAGNOSIS — Z0000 Encounter for general adult medical examination without abnormal findings: Secondary | ICD-10-CM

## 2017-04-09 DIAGNOSIS — I700 Atherosclerosis of aorta: Secondary | ICD-10-CM | POA: Diagnosis not present

## 2017-04-09 DIAGNOSIS — Z23 Encounter for immunization: Secondary | ICD-10-CM | POA: Diagnosis not present

## 2017-04-09 LAB — GLUCOSE, CAPILLARY: Glucose-Capillary: 186 mg/dL — ABNORMAL HIGH (ref 65–99)

## 2017-04-09 LAB — POCT GLYCOSYLATED HEMOGLOBIN (HGB A1C): Hemoglobin A1C: 7.3

## 2017-04-09 MED ORDER — LISINOPRIL 20 MG PO TABS: 20.0000 mg | ORAL_TABLET | Freq: Every day | ORAL | 3 refills | Status: DC

## 2017-04-09 NOTE — Assessment & Plan Note (Signed)
Assessment  He continues to deny claudication symptoms. He is tolerating the aspirin well.  Plan  We will continue risk factor modification with control of his blood pressure, diabetes, and hyperlipidemia. He will also continue the aspirin at 81 mg by mouth daily. We will reassess the efficacy of these interventions in preventing claudication or other signs or symptoms consistent with aortic atherosclerosis at the follow-up visit.

## 2017-04-09 NOTE — Assessment & Plan Note (Signed)
Assessment  He is down 6 pounds since the last clinic visit with me. He was congratulated on this success. He has been watching his diet.  Plan  He will continue to watch his diet and decrease his portion control. We will reassess the success of this lifestyle modification at the follow-up visit.

## 2017-04-09 NOTE — Assessment & Plan Note (Signed)
Assessment  His bilateral knee osteoarthritis continue to bother him periodically. He uses an ointment which is effective as well as as needed etodolac.  Plan  He will continue the as needed etodolac and the topical ointment for his knee pain. We will reassess the efficacy of this in controlling his pain and allowing him to perform his necessary duties at the follow-up visit.

## 2017-04-09 NOTE — Progress Notes (Signed)
   Subjective:   Cassie FreerCharles L Sistrunk is a 71 y.o. male who presents for a Medicare Annual Wellness Visit.  The following items have been reviewed and updated today in the appropriate area in the EMR.   We discussed his risky behavior.  Specifically his diet and the desserts he eats each night.  We also screened for alcohol abuse using the AUDIT tool and it was negative (0 points)  Depression screening was done with the PHQ-9 instrument.  It was negative as he scored only 2 points. Medical and family history were reviewed and updated.  There were no changes from previously. I remain his PCP and Dr. Laruth BouchardS. Groat remains as his Ophthalmologist Cognitive screen was performed using the AD8 Dementia Screening Interview which was negative (0 points) Written screening schedule was provided in his AVS Risk Factor list was provided in his AVS Personalized health advice, risky behaviors, and treatment advice were given, specifically monitoring sweets in his diet and decreasing portion size.      Objective:    Vitals: BP (!) 141/73 (BP Location: Right Arm, Patient Position: Sitting, Cuff Size: Normal)   Pulse 68   Wt 206 lb 1.6 oz (93.5 kg)   SpO2 98%   BMI 32.28 kg/m   Right foot:  Small puncture mark on the plantar surface near the first metatarsal head.  There is no erythema, swelling, or drainage.   Activities of Daily Living In your present state of health, do you have any difficulty performing the following activities: 11/14/2016 08/16/2016  Hearing? N N  Vision? N N  Difficulty concentrating or making decisions? N N  Walking or climbing stairs? N N  Dressing or bathing? N N  Doing errands, shopping? N N  Some recent data might be hidden    Goals Goals    . Blood Pressure < 140/90    . HEMOGLOBIN A1C < 7.0       Fall Risk Fall Risk  08/16/2016 12/31/2015 05/21/2015 04/09/2015 02/19/2015  Falls in the past year? No No Yes Yes Yes  Number falls in past yr: - - 2 or more 1 1  Injury with  Fall? - - No No No  Risk Factor Category  - - High Fall Risk - -  Risk for fall due to : - Impaired vision;Medication side effect History of fall(s);Impaired vision Impaired vision Impaired vision;History of fall(s)  Follow up - - Education provided Falls prevention discussed -    Depression Screen PHQ 2/9 Scores 08/16/2016 12/31/2015 05/21/2015 04/09/2015  PHQ - 2 Score 0 1 0 0     Cognitive Testing I assessed the patient for cognitive issues and the patient did not have issues with his cognition.    Assessment and Plan:    During the course of the visit the patient was educated and counseled about appropriate screening and preventive services as documented in the assessment and plan.  The printed AVS was given to the patient and included an updated screening schedule, a list of risk factors, and personalized health advice.    Please see the specific assessment/plans for his chronic medical problems.    Rocco SereneLawrence D Sheyla Zaffino, MD  04/09/2017

## 2017-04-09 NOTE — Patient Instructions (Addendum)
Annual Wellness Visit   Medicare Covered Preventative Screenings and Services  Services & Screenings Men and Women Who How Often Need? Date of Last Service Action  Abdominal Aortic Aneurysm Adults with AAA risk factors Once N    Alcohol Misuse and Counseling All Adults Screening once a year if no alcohol misuse. Counseling up to 4 face to face sessions. N  Audit was negative  Bone Density Measurement  Adults at risk for osteoporosis Once every 2 yrs N    Lipid Panel Z13.6 All adults without CV disease Once every 5 yrs N    Colorectal Cancer   Stool sample or  Colonoscopy All adults 50 and older   Once every year  Every 10 years N    Depression All Adults Once a year Y Today No action as PHQ-9 is 2  Diabetes Screening Blood glucose, post glucose load, or GTT Z13.1  All adults at risk  Pre-diabetics  Once per year  Twice per year N    Diabetes  Self-Management Training All adults Diabetics 10 hrs first year; 2 hours subsequent years. Requires Copay     Glaucoma  Diabetics  Family history of glaucoma  African Americans 50 yrs +  Hispanic Americans 65 yrs + Annually - requires coppay -    Hepatitis C Z72.89 or F19.20  High Risk for HCV  Born between 1945 and 1965  Annually  Once N  Previously completed  HIV Z11.4 All adults based on risk  Annually btw ages 5915 & 6565 regardless of risk  Annually > 65 yrs if at increased risk N    Lung Cancer Screening Asymptomatic adults aged 955-77 with 30 pack yr history and current smoker OR quit within the last 15 yrs Annually Must have counseling and shared decision making documentation before first screen N    Medical Nutrition Therapy Adults with   Diabetes  Renal disease  Kidney transplant within past 3 yrs 3 hours first year; 2 hours subsequent years N    Obesity and Counseling All adults Screening once a year Counseling if BMI 30 or higher Y Today Cutting back on dinners  Tobacco Use Counseling Adults who use tobacco   Up to 8 visits in one year N    Vaccines Z23  Hepatitis B  Influenza   Pneumonia  Adults   Once  Once every flu season  Two different vaccines separated by one year Y  Flu vaccine given today  Next Annual Wellness Visit People with Medicare Every year Y      Things That May Be Affecting Your Health:  Alcohol  Hearing loss X Pain    Depression  Home Safety  Sexual Health  X Diabetes  Lack of physical activity  Stress   Difficulty with daily activities  Loneliness  Tiredness   Drug use  Medicines  Tobacco use   Falls  Motor Vehicle Safety X Weight   Food choices  Oral Health  Other    YOUR PERSONALIZED HEALTH PLAN : 1. Schedule your next subsequent Medicare Wellness visit in one year 2. Attend all of your regular appointments to address your medical issues 3. Complete the preventative screenings and services 4. Keep taking your medications as you are.  I have increased the lisinopril to 20 mg daily.  I am not sure how the dose was changed to 10 mg. 5. I will see you in 3 months, sooner if necessary.

## 2017-04-09 NOTE — Assessment & Plan Note (Signed)
Assessment  His hemoglobin A1c today was stable at 7.3. This is just above the target of 7. This is on metformin 1500 mg by mouth daily. He is tolerating this well. He admits that he is not watching his portion control and particularly not avoiding sweets. He states he has dessert with each meal.  Plan  He will continue to work on weight loss by portion control. He will also cut back on his desserts to 1 or 2 per week only. We will continue the metformin 1500 mg by mouth daily. We will reassess his glycemic control at the follow-up visit with a repeat hemoglobin A1c. We will also [facility]. We did not address the erectile dysfunction during this annular wellness visit.

## 2017-04-09 NOTE — Assessment & Plan Note (Signed)
Assessment  He continues to take the pantoprazole 40 mg by mouth twice daily. Despite this he has some symptoms depending on the position in which he is lying. He has managed to control his reflux by avoiding these positions, specifically lying supine.  Plan  We will continue the pantoprazole at 40 mg by mouth daily. We will continue to work on weight loss as well which may help with acid reflux. He will continue to avoid those positions that worsen his symptoms. We will reassess the efficacy of these interventions at the follow-up visit.

## 2017-04-09 NOTE — Assessment & Plan Note (Signed)
He received a flu vaccination today.  He is otherwise up-to-date on his healthcare maintenance. 

## 2017-05-04 ENCOUNTER — Encounter (HOSPITAL_BASED_OUTPATIENT_CLINIC_OR_DEPARTMENT_OTHER): Payer: Self-pay | Admitting: *Deleted

## 2017-05-04 ENCOUNTER — Observation Stay (HOSPITAL_BASED_OUTPATIENT_CLINIC_OR_DEPARTMENT_OTHER): Payer: PPO

## 2017-05-04 ENCOUNTER — Emergency Department (HOSPITAL_BASED_OUTPATIENT_CLINIC_OR_DEPARTMENT_OTHER): Payer: PPO

## 2017-05-04 ENCOUNTER — Other Ambulatory Visit: Payer: Self-pay

## 2017-05-04 ENCOUNTER — Inpatient Hospital Stay (HOSPITAL_BASED_OUTPATIENT_CLINIC_OR_DEPARTMENT_OTHER)
Admission: EM | Admit: 2017-05-04 | Discharge: 2017-05-09 | DRG: 419 | Disposition: A | Discharge status: Home / Self Care | Payer: PPO | Attending: Oncology | Admitting: Oncology

## 2017-05-04 DIAGNOSIS — K810 Acute cholecystitis: Secondary | ICD-10-CM | POA: Diagnosis present

## 2017-05-04 DIAGNOSIS — G8929 Other chronic pain: Secondary | ICD-10-CM | POA: Diagnosis present

## 2017-05-04 DIAGNOSIS — K8000 Calculus of gallbladder with acute cholecystitis without obstruction: Secondary | ICD-10-CM | POA: Diagnosis present

## 2017-05-04 DIAGNOSIS — R1084 Generalized abdominal pain: Secondary | ICD-10-CM | POA: Diagnosis present

## 2017-05-04 DIAGNOSIS — Z888 Allergy status to other drugs, medicaments and biological substances status: Secondary | ICD-10-CM | POA: Diagnosis not present

## 2017-05-04 DIAGNOSIS — K829 Disease of gallbladder, unspecified: Secondary | ICD-10-CM

## 2017-05-04 DIAGNOSIS — Z7984 Long term (current) use of oral hypoglycemic drugs: Secondary | ICD-10-CM

## 2017-05-04 DIAGNOSIS — Z885 Allergy status to narcotic agent status: Secondary | ICD-10-CM

## 2017-05-04 DIAGNOSIS — Z9049 Acquired absence of other specified parts of digestive tract: Secondary | ICD-10-CM | POA: Diagnosis not present

## 2017-05-04 DIAGNOSIS — Z87828 Personal history of other (healed) physical injury and trauma: Secondary | ICD-10-CM

## 2017-05-04 DIAGNOSIS — Z87891 Personal history of nicotine dependence: Secondary | ICD-10-CM

## 2017-05-04 DIAGNOSIS — R109 Unspecified abdominal pain: Secondary | ICD-10-CM

## 2017-05-04 DIAGNOSIS — M25511 Pain in right shoulder: Secondary | ICD-10-CM | POA: Diagnosis present

## 2017-05-04 DIAGNOSIS — N521 Erectile dysfunction due to diseases classified elsewhere: Secondary | ICD-10-CM | POA: Diagnosis present

## 2017-05-04 DIAGNOSIS — Z7982 Long term (current) use of aspirin: Secondary | ICD-10-CM

## 2017-05-04 DIAGNOSIS — E1151 Type 2 diabetes mellitus with diabetic peripheral angiopathy without gangrene: Secondary | ICD-10-CM | POA: Diagnosis present

## 2017-05-04 DIAGNOSIS — I10 Essential (primary) hypertension: Secondary | ICD-10-CM | POA: Diagnosis present

## 2017-05-04 DIAGNOSIS — Z8719 Personal history of other diseases of the digestive system: Secondary | ICD-10-CM | POA: Diagnosis not present

## 2017-05-04 DIAGNOSIS — Z978 Presence of other specified devices: Secondary | ICD-10-CM | POA: Diagnosis not present

## 2017-05-04 DIAGNOSIS — E1140 Type 2 diabetes mellitus with diabetic neuropathy, unspecified: Secondary | ICD-10-CM | POA: Diagnosis present

## 2017-05-04 DIAGNOSIS — R51 Headache: Secondary | ICD-10-CM | POA: Diagnosis present

## 2017-05-04 DIAGNOSIS — I700 Atherosclerosis of aorta: Secondary | ICD-10-CM | POA: Diagnosis present

## 2017-05-04 DIAGNOSIS — K660 Peritoneal adhesions (postprocedural) (postinfection): Secondary | ICD-10-CM | POA: Diagnosis present

## 2017-05-04 DIAGNOSIS — R1013 Epigastric pain: Secondary | ICD-10-CM

## 2017-05-04 DIAGNOSIS — Z791 Long term (current) use of non-steroidal anti-inflammatories (NSAID): Secondary | ICD-10-CM | POA: Diagnosis not present

## 2017-05-04 DIAGNOSIS — M25512 Pain in left shoulder: Secondary | ICD-10-CM | POA: Diagnosis present

## 2017-05-04 DIAGNOSIS — K219 Gastro-esophageal reflux disease without esophagitis: Secondary | ICD-10-CM | POA: Diagnosis present

## 2017-05-04 DIAGNOSIS — E119 Type 2 diabetes mellitus without complications: Secondary | ICD-10-CM | POA: Diagnosis not present

## 2017-05-04 DIAGNOSIS — K828 Other specified diseases of gallbladder: Secondary | ICD-10-CM | POA: Diagnosis present

## 2017-05-04 DIAGNOSIS — R1011 Right upper quadrant pain: Secondary | ICD-10-CM | POA: Diagnosis not present

## 2017-05-04 DIAGNOSIS — R079 Chest pain, unspecified: Secondary | ICD-10-CM | POA: Diagnosis present

## 2017-05-04 DIAGNOSIS — Z833 Family history of diabetes mellitus: Secondary | ICD-10-CM

## 2017-05-04 DIAGNOSIS — Z79899 Other long term (current) drug therapy: Secondary | ICD-10-CM | POA: Diagnosis not present

## 2017-05-04 DIAGNOSIS — M1712 Unilateral primary osteoarthritis, left knee: Secondary | ICD-10-CM | POA: Diagnosis present

## 2017-05-04 DIAGNOSIS — M545 Low back pain: Secondary | ICD-10-CM | POA: Diagnosis present

## 2017-05-04 DIAGNOSIS — R0789 Other chest pain: Secondary | ICD-10-CM | POA: Diagnosis not present

## 2017-05-04 DIAGNOSIS — Z8249 Family history of ischemic heart disease and other diseases of the circulatory system: Secondary | ICD-10-CM

## 2017-05-04 DIAGNOSIS — K819 Cholecystitis, unspecified: Secondary | ICD-10-CM | POA: Diagnosis present

## 2017-05-04 LAB — URINALYSIS, ROUTINE W REFLEX MICROSCOPIC
Glucose, UA: NEGATIVE mg/dL
KETONES UR: 15 mg/dL — AB
LEUKOCYTES UA: NEGATIVE
NITRITE: NEGATIVE
PH: 5.5 (ref 5.0–8.0)
PROTEIN: NEGATIVE mg/dL
Specific Gravity, Urine: >1.03 — ABNORMAL HIGH (ref 1.005–1.030)

## 2017-05-04 LAB — HEPATIC FUNCTION PANEL
ALT: 22 U/L (ref 17–63)
AST: 23 U/L (ref 15–41)
Albumin: 4.2 g/dL (ref 3.5–5.0)
Alkaline Phosphatase: 72 U/L (ref 38–126)
BILIRUBIN INDIRECT: 0.7 mg/dL (ref 0.3–0.9)
Bilirubin, Direct: 0.1 mg/dL (ref 0.1–0.5)
TOTAL PROTEIN: 7.9 g/dL (ref 6.5–8.1)
Total Bilirubin: 0.8 mg/dL (ref 0.3–1.2)

## 2017-05-04 LAB — BASIC METABOLIC PANEL
ANION GAP: 12 (ref 5–15)
BUN: 11 mg/dL (ref 6–20)
CALCIUM: 8.9 mg/dL (ref 8.9–10.3)
CO2: 21 mmol/L — ABNORMAL LOW (ref 22–32)
CREATININE: 0.58 mg/dL — AB (ref 0.61–1.24)
Chloride: 101 mmol/L (ref 101–111)
GLUCOSE: 155 mg/dL — AB (ref 65–99)
Potassium: 4 mmol/L (ref 3.5–5.1)
Sodium: 134 mmol/L — ABNORMAL LOW (ref 135–145)

## 2017-05-04 LAB — URINALYSIS, MICROSCOPIC (REFLEX): WBC UA: NONE SEEN WBC/hpf (ref 0–5)

## 2017-05-04 LAB — CBC
HCT: 41.2 % (ref 39.0–52.0)
HEMOGLOBIN: 13.9 g/dL (ref 13.0–17.0)
MCH: 27.9 pg (ref 26.0–34.0)
MCHC: 33.7 g/dL (ref 30.0–36.0)
MCV: 82.7 fL (ref 78.0–100.0)
PLATELETS: 249 10*3/uL (ref 150–400)
RBC: 4.98 MIL/uL (ref 4.22–5.81)
RDW: 13.6 % (ref 11.5–15.5)
WBC: 19.7 10*3/uL — ABNORMAL HIGH (ref 4.0–10.5)

## 2017-05-04 LAB — TROPONIN I: Troponin I: <0.03 ng/mL (ref <0.03)

## 2017-05-04 MED ORDER — MORPHINE SULFATE (PF) 4 MG/ML IV SOLN
4.0000 mg | Freq: Once | INTRAVENOUS | Status: AC
  Administered 2017-05-04: 4 mg via INTRAVENOUS
  Filled 2017-05-04: qty 1

## 2017-05-04 MED ORDER — ASPIRIN 81 MG PO CHEW
324.0000 mg | CHEWABLE_TABLET | Freq: Once | ORAL | Status: AC
  Administered 2017-05-04: 324 mg via ORAL
  Filled 2017-05-04: qty 4

## 2017-05-04 MED ORDER — NITROGLYCERIN 0.4 MG SL SUBL
0.4000 mg | SUBLINGUAL_TABLET | SUBLINGUAL | Status: DC | PRN
  Administered 2017-05-04 (×3): 0.4 mg via SUBLINGUAL
  Filled 2017-05-04 (×2): qty 1
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
## 2017-05-04 NOTE — ED Triage Notes (Signed)
chest pain since 3 am. Pain is epigastric and feels like gas. He took tums and gasx with no relief.

## 2017-05-04 NOTE — ED Notes (Signed)
Dr. Dalene SeltzerSchlossman advised to give pt 1 more NTG.

## 2017-05-04 NOTE — ED Notes (Signed)
Gastro inspection reported to the EDP .... CT scan has been ordered.

## 2017-05-05 ENCOUNTER — Inpatient Hospital Stay (HOSPITAL_COMMUNITY): Payer: PPO

## 2017-05-05 ENCOUNTER — Encounter (HOSPITAL_COMMUNITY): Payer: Self-pay | Admitting: *Deleted

## 2017-05-05 DIAGNOSIS — M545 Low back pain: Secondary | ICD-10-CM | POA: Diagnosis present

## 2017-05-05 DIAGNOSIS — M1712 Unilateral primary osteoarthritis, left knee: Secondary | ICD-10-CM | POA: Diagnosis present

## 2017-05-05 DIAGNOSIS — E1140 Type 2 diabetes mellitus with diabetic neuropathy, unspecified: Secondary | ICD-10-CM | POA: Diagnosis present

## 2017-05-05 DIAGNOSIS — I700 Atherosclerosis of aorta: Secondary | ICD-10-CM | POA: Diagnosis present

## 2017-05-05 DIAGNOSIS — Z87828 Personal history of other (healed) physical injury and trauma: Secondary | ICD-10-CM | POA: Diagnosis not present

## 2017-05-05 DIAGNOSIS — K810 Acute cholecystitis: Secondary | ICD-10-CM | POA: Diagnosis present

## 2017-05-05 DIAGNOSIS — Z8719 Personal history of other diseases of the digestive system: Secondary | ICD-10-CM | POA: Diagnosis not present

## 2017-05-05 DIAGNOSIS — R51 Headache: Secondary | ICD-10-CM | POA: Diagnosis present

## 2017-05-05 DIAGNOSIS — G8929 Other chronic pain: Secondary | ICD-10-CM | POA: Diagnosis present

## 2017-05-05 DIAGNOSIS — K828 Other specified diseases of gallbladder: Secondary | ICD-10-CM | POA: Diagnosis present

## 2017-05-05 DIAGNOSIS — Z9049 Acquired absence of other specified parts of digestive tract: Secondary | ICD-10-CM | POA: Diagnosis not present

## 2017-05-05 DIAGNOSIS — R1013 Epigastric pain: Secondary | ICD-10-CM

## 2017-05-05 DIAGNOSIS — Z7984 Long term (current) use of oral hypoglycemic drugs: Secondary | ICD-10-CM | POA: Diagnosis not present

## 2017-05-05 DIAGNOSIS — K219 Gastro-esophageal reflux disease without esophagitis: Secondary | ICD-10-CM | POA: Diagnosis present

## 2017-05-05 DIAGNOSIS — E119 Type 2 diabetes mellitus without complications: Secondary | ICD-10-CM | POA: Diagnosis not present

## 2017-05-05 DIAGNOSIS — E1151 Type 2 diabetes mellitus with diabetic peripheral angiopathy without gangrene: Secondary | ICD-10-CM | POA: Diagnosis present

## 2017-05-05 DIAGNOSIS — M25511 Pain in right shoulder: Secondary | ICD-10-CM | POA: Diagnosis present

## 2017-05-05 DIAGNOSIS — M25512 Pain in left shoulder: Secondary | ICD-10-CM | POA: Diagnosis present

## 2017-05-05 DIAGNOSIS — Z885 Allergy status to narcotic agent status: Secondary | ICD-10-CM | POA: Diagnosis not present

## 2017-05-05 DIAGNOSIS — Z7982 Long term (current) use of aspirin: Secondary | ICD-10-CM | POA: Diagnosis not present

## 2017-05-05 DIAGNOSIS — K819 Cholecystitis, unspecified: Secondary | ICD-10-CM | POA: Diagnosis present

## 2017-05-05 DIAGNOSIS — Z79899 Other long term (current) drug therapy: Secondary | ICD-10-CM | POA: Diagnosis not present

## 2017-05-05 DIAGNOSIS — Z978 Presence of other specified devices: Secondary | ICD-10-CM | POA: Diagnosis not present

## 2017-05-05 DIAGNOSIS — K8000 Calculus of gallbladder with acute cholecystitis without obstruction: Secondary | ICD-10-CM | POA: Diagnosis present

## 2017-05-05 DIAGNOSIS — R1084 Generalized abdominal pain: Secondary | ICD-10-CM | POA: Diagnosis present

## 2017-05-05 DIAGNOSIS — I10 Essential (primary) hypertension: Secondary | ICD-10-CM | POA: Diagnosis present

## 2017-05-05 DIAGNOSIS — Z791 Long term (current) use of non-steroidal anti-inflammatories (NSAID): Secondary | ICD-10-CM | POA: Diagnosis not present

## 2017-05-05 DIAGNOSIS — N521 Erectile dysfunction due to diseases classified elsewhere: Secondary | ICD-10-CM | POA: Diagnosis present

## 2017-05-05 DIAGNOSIS — Z87891 Personal history of nicotine dependence: Secondary | ICD-10-CM | POA: Diagnosis not present

## 2017-05-05 DIAGNOSIS — K660 Peritoneal adhesions (postprocedural) (postinfection): Secondary | ICD-10-CM | POA: Diagnosis present

## 2017-05-05 DIAGNOSIS — Z888 Allergy status to other drugs, medicaments and biological substances status: Secondary | ICD-10-CM | POA: Diagnosis not present

## 2017-05-05 DIAGNOSIS — R109 Unspecified abdominal pain: Secondary | ICD-10-CM | POA: Diagnosis not present

## 2017-05-05 LAB — TROPONIN I

## 2017-05-05 LAB — GLUCOSE, CAPILLARY
GLUCOSE-CAPILLARY: 153 mg/dL — AB (ref 65–99)
GLUCOSE-CAPILLARY: 162 mg/dL — AB (ref 65–99)
GLUCOSE-CAPILLARY: 171 mg/dL — AB (ref 65–99)
Glucose-Capillary: 180 mg/dL — ABNORMAL HIGH (ref 65–99)
Glucose-Capillary: 161 mg/dL — ABNORMAL HIGH (ref 65–99)

## 2017-05-05 LAB — CBC
HEMATOCRIT: 37.7 % — AB (ref 39.0–52.0)
HEMOGLOBIN: 12.4 g/dL — AB (ref 13.0–17.0)
MCH: 28.1 pg (ref 26.0–34.0)
MCHC: 32.9 g/dL (ref 30.0–36.0)
MCV: 85.3 fL (ref 78.0–100.0)
Platelets: 247 10*3/uL (ref 150–400)
RBC: 4.42 MIL/uL (ref 4.22–5.81)
RDW: 13.6 % (ref 11.5–15.5)
WBC: 17.3 10*3/uL — AB (ref 4.0–10.5)

## 2017-05-05 LAB — BASIC METABOLIC PANEL
Anion gap: 11 (ref 5–15)
BUN: 7 mg/dL (ref 6–20)
CALCIUM: 8.3 mg/dL — AB (ref 8.9–10.3)
CO2: 23 mmol/L (ref 22–32)
CREATININE: 0.71 mg/dL (ref 0.61–1.24)
Chloride: 100 mmol/L — ABNORMAL LOW (ref 101–111)
GFR calc Af Amer: >60 mL/min (ref >60)
GLUCOSE: 179 mg/dL — AB (ref 65–99)
POTASSIUM: 3.8 mmol/L (ref 3.5–5.1)
SODIUM: 134 mmol/L — AB (ref 135–145)

## 2017-05-05 LAB — MRSA PCR SCREENING: MRSA BY PCR: NEGATIVE

## 2017-05-05 MED ORDER — SODIUM CHLORIDE 0.9 % IV SOLN
INTRAVENOUS | Status: AC
  Administered 2017-05-05: 20:00:00 via INTRAVENOUS

## 2017-05-05 MED ORDER — PANTOPRAZOLE SODIUM 40 MG PO TBEC
40.0000 mg | DELAYED_RELEASE_TABLET | Freq: Two times a day (BID) | ORAL | Status: DC
  Administered 2017-05-05 – 2017-05-09 (×9): 40 mg via ORAL
  Filled 2017-05-05 (×9): qty 1

## 2017-05-05 MED ORDER — INSULIN ASPART 100 UNIT/ML ~~LOC~~ SOLN
0.0000 [IU] | SUBCUTANEOUS | Status: DC
  Administered 2017-05-05 – 2017-05-06 (×5): 3 [IU] via SUBCUTANEOUS
  Administered 2017-05-06: 2 [IU] via SUBCUTANEOUS
  Administered 2017-05-06: 3 [IU] via SUBCUTANEOUS
  Administered 2017-05-06 (×2): 2 [IU] via SUBCUTANEOUS
  Administered 2017-05-06: 3 [IU] via SUBCUTANEOUS
  Administered 2017-05-07 (×2): 2 [IU] via SUBCUTANEOUS
  Administered 2017-05-07: 11 [IU] via SUBCUTANEOUS
  Administered 2017-05-07: 3 [IU] via SUBCUTANEOUS
  Administered 2017-05-08: 5 [IU] via SUBCUTANEOUS
  Administered 2017-05-08: 8 [IU] via SUBCUTANEOUS
  Administered 2017-05-08: 5 [IU] via SUBCUTANEOUS
  Administered 2017-05-08: 8 [IU] via SUBCUTANEOUS
  Administered 2017-05-08: 5 [IU] via SUBCUTANEOUS

## 2017-05-05 MED ORDER — ASPIRIN EC 81 MG PO TBEC
81.0000 mg | DELAYED_RELEASE_TABLET | Freq: Every morning | ORAL | Status: DC
  Administered 2017-05-05 – 2017-05-07 (×3): 81 mg via ORAL
  Filled 2017-05-05 (×3): qty 1

## 2017-05-05 MED ORDER — MORPHINE SULFATE (PF) 2 MG/ML IV SOLN
1.0000 mg | Freq: Once | INTRAVENOUS | Status: AC
  Administered 2017-05-05: 1 mg via INTRAVENOUS
  Filled 2017-05-05: qty 1

## 2017-05-05 MED ORDER — DEXTROSE 5 % IV SOLN: 2.0000 g | INTRAVENOUS | Status: DC

## 2017-05-05 MED ORDER — METRONIDAZOLE IN NACL 5-0.79 MG/ML-% IV SOLN
500.0000 mg | Freq: Three times a day (TID) | INTRAVENOUS | Status: DC
  Administered 2017-05-05: 500 mg via INTRAVENOUS
  Filled 2017-05-05 (×2): qty 100

## 2017-05-05 MED ORDER — MORPHINE SULFATE (PF) 4 MG/ML IV SOLN
4.0000 mg | INTRAVENOUS | Status: DC | PRN
  Administered 2017-05-05 – 2017-05-06 (×6): 4 mg via INTRAVENOUS
  Filled 2017-05-05 (×6): qty 1

## 2017-05-05 MED ORDER — ONDANSETRON HCL 4 MG/2ML IJ SOLN: 4.0000 mg | Freq: Four times a day (QID) | INTRAMUSCULAR | Status: DC | PRN

## 2017-05-05 MED ORDER — ONDANSETRON HCL 4 MG PO TABS
4.0000 mg | ORAL_TABLET | Freq: Four times a day (QID) | ORAL | Status: DC | PRN
Start: 2017-05-05 — End: 2017-05-09

## 2017-05-05 MED ORDER — ENOXAPARIN SODIUM 40 MG/0.4ML ~~LOC~~ SOLN
40.0000 mg | SUBCUTANEOUS | Status: DC
  Administered 2017-05-05 – 2017-05-06 (×2): 40 mg via SUBCUTANEOUS
  Filled 2017-05-05 (×2): qty 0.4

## 2017-05-05 MED ORDER — CEFTRIAXONE SODIUM 2 G IJ SOLR
2.0000 g | Freq: Once | INTRAMUSCULAR | Status: AC
  Administered 2017-05-05: 2 g via INTRAVENOUS
  Filled 2017-05-05: qty 2

## 2017-05-05 MED ORDER — IOPAMIDOL (ISOVUE-300) INJECTION 61%
100.0000 mL | Freq: Once | INTRAVENOUS | Status: AC | PRN
  Administered 2017-05-05: 100 mL via INTRAVENOUS

## 2017-05-05 MED ORDER — POLYETHYLENE GLYCOL 3350 17 G PO PACK: 17.0000 g | PACK | Freq: Every day | ORAL | Status: DC | PRN

## 2017-05-05 MED ORDER — SODIUM CHLORIDE 0.9 % IV SOLN
INTRAVENOUS | Status: DC
  Administered 2017-05-05: 06:00:00 via INTRAVENOUS

## 2017-05-05 MED ORDER — PIPERACILLIN-TAZOBACTAM 3.375 G IVPB
3.3750 g | Freq: Three times a day (TID) | INTRAVENOUS | Status: DC
  Administered 2017-05-05 – 2017-05-08 (×8): 3.375 g via INTRAVENOUS
  Filled 2017-05-05 (×10): qty 50

## 2017-05-05 NOTE — Progress Notes (Signed)
   Subjective: The patient was seen returning to his room from his RUQ ultrasound on rounds this morning. He states that his pain is currently well controlled with morphine. Patient denies fevers, chills, and nausea.  Objective:  Vital signs in last 24 hours: Vitals:   05/05/17 0100 05/05/17 0214 05/05/17 0222 05/05/17 0449  BP: 130/67  137/69 135/62  Pulse: 82  82 79  Resp: (!) 22  20 20   Temp:   99.1 F (37.3 C) 99.2 F (37.3 C)  TempSrc:   Oral Oral  SpO2: 95%     Weight:  207 lb (93.9 kg)    Height:  5\' 8"  (1.727 m)     Physical Exam  Constitutional:  Patient laying still in bed, clearly uncomfortable but in no acute distress  Cardiovascular: Normal rate, regular rhythm and intact distal pulses. Exam reveals no friction rub.  No murmur heard. Respiratory: Effort normal and breath sounds normal. No respiratory distress. He has no wheezes.  GI: Soft. There is tenderness (Mild tenderness with palpation of epigastric region). There is no rebound and no guarding.  Multiple, well healed surgical scars on lower abdomen.  Musculoskeletal: He exhibits no edema (of bilateral lower extremities) or tenderness (of bilateral lower extremities).  Skin: Skin is warm and dry. No rash noted. No erythema.   Assessment/Plan:  Principal Problem:   Midepigastric pain Active Problems:   Essential hypertension   Chest pain   Cholecystitis  Tyler Bridges is a 71 yo male with PMH of HTN, DM, and previous SBOs who presents with acute onset epigastric pain. His workup in the ED was concerning for acute cholecystitis and the patient was empirically started on ceftriaxone and metronidazole. The patient was admitted to the internal medicine teaching service for management. The specific problems addressed during his admission were as follows:  Acute cholecystitis, with imaging concerning for emphysematous cholecystitis: The patient's clinical presentation and initial CT abdomen/pelvis was concerning for  cholecystitis without obvious obstruction. He was afebrile and hemodynamically stable on admission. He did, however, have a leukocytosis of 19.7 on admission, 17.3 on repeat. Follow up RUQ ultrasound showed possible emphysematous cholecystitis and surgery was consulted with these imaging findings. They plan to see him today. Patient hemodynamically stable and without peritoneal signs on exam this AM. Will plan to continue antibiotics and pain control as described below until surgical evaluation.  -Surgical consult pending -NPO until surgery consult, clinical improvement -Morphine 4 mg q4 hours PRN -Ceftriaxone, metronidazole per pharmacy dosing -Repeat CBC in AM to ensure downtrending  HTN: Patient currently normotensive. -Hold home lisinopril, 20 mg  -Continue to monitor  T2DM: -Hold home metformin -SSI-moderate while inpatient (q4 hours while NPO)  F/E/N, GI: -NS @125  ml/hr -NPO pending surgery consult, clinical improvement -Home pantroprazole 40 mg BID  Dispo: Anticipated discharge pending clinical improvement.    Tyler Bridges, Tyler Mesch, MD 05/05/2017, 11:59 AM Pager: [phone]

## 2017-05-05 NOTE — Progress Notes (Addendum)
Pharmacy Antibiotic Note  Tyler Bridges is a 71 y.o. male admitted on 05/04/2017 with acute cholecystitis.  Pharmacy has been consulted for Rocephin dosing.  Plan: Rocephin 2g IV Q24H. Pharmacy will sign off  Height: 5\' 8"  (172.7 cm) Weight: 207 lb (93.9 kg) IBW/kg (Calculated) : 68.4  Temp (24hrs), Avg:99 F (37.2 C), Min:98.9 F (37.2 C), Max:99.1 F (37.3 C)  Recent Labs  Lab 05/04/17 1510  WBC 19.7*  CREATININE 0.58*    Estimated Creatinine Clearance: 92.8 mL/min (A) (by C-G formula based on SCr of 0.58 mg/dL (L)).    Allergies  Allergen Reactions  . Atorvastatin Other (See Comments)    Myalgias  . Percocet [Oxycodone-Acetaminophen] Itching  . Propoxyphene N-Acetaminophen Itching    Thank you for allowing pharmacy to be a part of this patient's care.  Vernard GamblesVeronda Bryk, PharmD, BCPS  05/05/2017 6:05 AM

## 2017-05-05 NOTE — Plan of Care (Signed)
  Progressing Education: Knowledge of General Education information will improve 05/05/2017 0426 - Progressing by Horris Latinouvall, Marina Desire G, RN Note Patient oriented to room and unit, as well as unit routine. Pain Managment: General experience of comfort will improve 05/05/2017 0426 - Progressing by Horris Latinouvall, Chamara Dyck G, RN Safety: Ability to remain free from injury will improve 05/05/2017 0426 - Progressing by Horris Latinouvall, Jacorian Golaszewski G, RN

## 2017-05-05 NOTE — H&P (Addendum)
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 Dema Severin, M.D. General and Colorectal Surgery [facility]
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
## 2017-05-06 LAB — GLUCOSE, CAPILLARY
GLUCOSE-CAPILLARY: 142 mg/dL — AB (ref 65–99)
Glucose-Capillary: 145 mg/dL — ABNORMAL HIGH (ref 65–99)
Glucose-Capillary: 131 mg/dL — ABNORMAL HIGH (ref 65–99)
Glucose-Capillary: 160 mg/dL — ABNORMAL HIGH (ref 65–99)
Glucose-Capillary: 177 mg/dL — ABNORMAL HIGH (ref 65–99)

## 2017-05-06 LAB — CBC
HCT: 36.1 % — ABNORMAL LOW (ref 39.0–52.0)
HEMOGLOBIN: 11.9 g/dL — AB (ref 13.0–17.0)
MCH: 28.1 pg (ref 26.0–34.0)
MCHC: 33 g/dL (ref 30.0–36.0)
MCV: 85.3 fL (ref 78.0–100.0)
Platelets: 220 10*3/uL (ref 150–400)
RBC: 4.23 MIL/uL (ref 4.22–5.81)
RDW: 13.5 % (ref 11.5–15.5)
WBC: 15.6 10*3/uL — AB (ref 4.0–10.5)

## 2017-05-06 LAB — SURGICAL PCR SCREEN
MRSA, PCR: NEGATIVE
STAPHYLOCOCCUS AUREUS: NEGATIVE

## 2017-05-06 LAB — BASIC METABOLIC PANEL
Anion gap: 10 (ref 5–15)
BUN: 5 mg/dL — ABNORMAL LOW (ref 6–20)
CHLORIDE: 98 mmol/L — AB (ref 101–111)
CO2: 22 mmol/L (ref 22–32)
Calcium: 8 mg/dL — ABNORMAL LOW (ref 8.9–10.3)
Creatinine, Ser: 0.64 mg/dL (ref 0.61–1.24)
GFR calc non Af Amer: >60 mL/min (ref >60)
Glucose, Bld: 153 mg/dL — ABNORMAL HIGH (ref 65–99)
POTASSIUM: 3.5 mmol/L (ref 3.5–5.1)
SODIUM: 130 mmol/L — AB (ref 135–145)

## 2017-05-06 LAB — PROTIME-INR
INR: 1.1
PROTHROMBIN TIME: 14.1 s (ref 11.4–15.2)

## 2017-05-06 MED ORDER — HYDROMORPHONE HCL 1 MG/ML IJ SOLN
1.0000 mg | INTRAMUSCULAR | Status: DC | PRN
  Administered 2017-05-06 – 2017-05-08 (×10): 1 mg via INTRAVENOUS
  Filled 2017-05-06 (×11): qty 1

## 2017-05-06 MED ORDER — SODIUM CHLORIDE 0.9 % IV SOLN
INTRAVENOUS | Status: AC
  Administered 2017-05-06: 12:00:00 via INTRAVENOUS

## 2017-05-06 NOTE — Progress Notes (Signed)
Progress Note: General Surgery Service   Assessment/Plan: Patient Active Problem List   Diagnosis Date Noted  . Midepigastric pain 05/05/2017  . Cholecystitis, acute 05/05/2017  . Chest pain 05/04/2017  . Seasonal allergic rhinitis 04/09/2015  . Chronic insomnia 04/09/2015  . Obesity (BMI 30.0-34.9) 02/19/2015  . Aortic atherosclerosis (HCC) 01/14/2015  . Healthcare maintenance 08/03/2014  . Left inguinal pain 07/19/2012  . Chronic abdominal pain 03/10/2010  . Essential hypertension 06/03/2009  . Benign prostatic hypertrophy with nocturia 11/29/2007  . Chronic low back pain 09/05/2007  . Osteoarthritis of left knee 11/02/2006  . 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 Hypertriglyceridemia 02/26/2006  . Gastroesophageal reflux disease 02/26/2006  acute cholecystitis, multiple previous abdominal surgeries. WBC downtrending, no guarding -continue antibiotics -likely OR tomorrow    LOS: 1 day  Chief Complaint/Subjective: Pain the same, some nausea, no vomiting  Objective: Vital signs in last 24 hours: Temp:  [98.1 F (36.7 C)-99.9 F (37.7 C)] 99 F (37.2 C) (12/30 0842) Pulse Rate:  [80-84] 80 (12/30 0402) Resp:  [21-27] 27 (12/30 0842) BP: (104-146)/(52-79) 126/79 (12/30 0842) SpO2:  [92 %-93 %] 93 % (12/30 0402) Weight:  [94 kg (207 lb 4.8 oz)] 94 kg (207 lb 4.8 oz) (12/30 0402) Last BM Date: 05/05/17  Intake/Output from previous day: 12/29 0701 - 12/30 0700 In: 2462.2 [P.O.:358; I.V.:2004.2; IV Piggyback:100] Out: 1525 [Urine:1525] Intake/Output this shift: Total I/O In: -  Out: 200 [Urine:200]  Lungs: CTAB  Cardiovascular: RRR  Abd: soft, diffuse pain on palpation, no guarding, no rebound  Extremities: no edema  Neuro: AOx4  Lab Results: CBC  Recent Labs    05/05/17 0505 05/06/17 0424  WBC 17.3* 15.6*  HGB 12.4* 11.9*  HCT 37.7* 36.1*  PLT 247 220   BMET Recent Labs    05/05/17 0505  05/06/17 0424  NA 134* 130*  K 3.8 3.5  CL 100* 98*  CO2 23 22  GLUCOSE 179* 153*  BUN 7 5*  CREATININE 0.71 0.64  CALCIUM 8.3* 8.0*   PT/INR Recent Labs    05/06/17 0424  LABPROT 14.1  INR 1.10   ABG No results for input(s): PHART, HCO3 in the last 72 hours.  Invalid input(s): PCO2, PO2  Studies/Results:  Anti-infectives: Anti-infectives (From admission, onward)   Start     Dose/Rate Route Frequency Ordered Stop   05/06/17 0600  cefTRIAXone (ROCEPHIN) 2 g in dextrose 5 % 50 mL IVPB  Status:  Discontinued     2 g 100 mL/hr over 30 Minutes Intravenous Every 24 hours 05/05/17 0606 05/05/17 1255   05/05/17 1400  piperacillin-tazobactam (ZOSYN) IVPB 3.375 g     3.375 g 12.5 mL/hr over 240 Minutes Intravenous Every 8 hours 05/05/17 1255     05/05/17 0615  cefTRIAXone (ROCEPHIN) 2 g in dextrose 5 % 50 mL IVPB     2 g 100 mL/hr over 30 Minutes Intravenous  Once 05/05/17 0606 05/05/17 0657   05/05/17 0600  metroNIDAZOLE (FLAGYL) IVPB 500 mg  Status:  Discontinued     500 mg 100 mL/hr over 60 Minutes Intravenous Every 8 hours 05/05/17 0506 05/05/17 1255      Medications: Scheduled Meds: . aspirin EC  81 mg Oral q morning - 10a  . enoxaparin (LOVENOX) injection  40 mg Subcutaneous Q24H  . insulin aspart  0-15 Units Subcutaneous Q4H  . pantoprazole  40 mg Oral BID   Continuous Infusions: . sodium chloride 125 mL/hr at 05/06/17  [id number]  . piperacillin-tazobactam (ZOSYN)  IV 3.375 g (05/06/17 0555)   PRN Meds:.morphine injection, ondansetron **OR** ondansetron (ZOFRAN) IV, polyethylene glycol  Rodman PickleLuke Aaron Kinsinger, MD Pg# [phone] [facility]

## 2017-05-06 NOTE — Progress Notes (Signed)
   Subjective: The patient was seen laying in bed this AM in no acute distress. Patient continues to endorse mid-epigastric pain and pain in RUQ overnight, which was somewhat controlled with PRN medications. Patient denies chills, nausea, and vomiting. Endorses passing some gas yesterday, which improved his stomach pain.   Objective:  Vital signs in last 24 hours: Vitals:   05/05/17 1223 05/05/17 2033 05/06/17 0402 05/06/17 0842  BP: (!) 115/52 (!) 146/73 128/71 126/79  Pulse:  84 80   Resp:  (!) 21 (!) 21 (!) 27  Temp:  99.9 F (37.7 C) 99.5 F (37.5 C) 99 F (37.2 C)  TempSrc:  Oral Oral Oral  SpO2:  92% 93%   Weight:   207 lb 4.8 oz (94 kg)   Height:       Physical Exam  Constitutional:  Patient laying in bed, uncomfortable but in no acute distress  HENT:  Mouth/Throat: Oropharynx is clear and moist.  Cardiovascular: Normal rate, regular rhythm and intact distal pulses. Exam reveals no friction rub.  No murmur heard. Respiratory: Effort normal and breath sounds normal. No respiratory distress. He has no wheezes.  GI: Soft. Bowel sounds are normal. There is tenderness (Mild tenderness with palpation of epigastric region and RUQ, unchanged from yesterday's exam). There is no rebound and no guarding.  Multiple, well healed surgical scars on mid abdomen.  Musculoskeletal: He exhibits no edema (of bilateral lower extremities) or tenderness (of bilateral lower extremities).  Skin: Skin is warm and dry. No rash noted. He is not diaphoretic. No erythema.   Assessment/Plan:  Principal Problem:   Cholecystitis, acute Active Problems:   Essential hypertension   Chest pain   Midepigastric pain  Tyler Bridges is a 71 yo male with PMH of HTN, DM, and previous SBOs who presents with acute onset epigastric pain. His workup in the ED was concerning for acute cholecystitis and the patient was empirically started on antibiotics. He was admitted to the internal medicine teaching service for  management. The specific problems addressed during his admission were as follows:  Acute cholecystitis: The patient has remained afebrile overnight and hemodynamically stable. His physical exam has not changed much since yesterday. Patient encouraged to continue PRN medications for pain control and alert staff if stomach pain worsens. Plan to touch base with surgery later today to see if patient able to go to OR today. If not, will likely give clear liquid diet and make patient NPO at midnight for surgery tomorrow.  -Surgical consulted and appreciated -NPO since midnight pending possible cholecystectomy if OR available -Morphine 4 mg q4 hours and ondansetron 4 mg PRN  -Ceftriaxone, zosyn -Follow up CBC  HTN: Patient currently normotensive and stable -Hold home lisinopril, 20 mg  -Continue to monitor  T2DM: -Hold home metformin -SSI-moderate while inpatient (q4 hours while NPO)  F/E/N, GI: -NS @125  ml/hr -NPO as above -Home pantroprazole 40 mg BID  VTE prophylaxis: -Lovenox  Dispo: Anticipated discharge pending cholecystectomy.    Rozann LeschesNedrud, Tyler Massi, MD 05/06/2017, 8:44 AM Pager: [phone]
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
## 2017-05-07 ENCOUNTER — Encounter (HOSPITAL_COMMUNITY): Payer: Self-pay | Admitting: Critical Care Medicine

## 2017-05-07 ENCOUNTER — Encounter (HOSPITAL_COMMUNITY)
Admission: EM | Disposition: A | Discharge status: Home / Self Care | Payer: Self-pay | Source: Home / Self Care | Attending: Oncology

## 2017-05-07 ENCOUNTER — Inpatient Hospital Stay (HOSPITAL_COMMUNITY): Payer: PPO | Admitting: Critical Care Medicine

## 2017-05-07 HISTORY — PX: CHOLECYSTECTOMY: SHX55

## 2017-05-07 LAB — CBC
HEMATOCRIT: 35 % — AB (ref 39.0–52.0)
Hemoglobin: 11.4 g/dL — ABNORMAL LOW (ref 13.0–17.0)
MCH: 27.9 pg (ref 26.0–34.0)
MCHC: 32.6 g/dL (ref 30.0–36.0)
MCV: 85.6 fL (ref 78.0–100.0)
PLATELETS: 214 10*3/uL (ref 150–400)
RBC: 4.09 MIL/uL — ABNORMAL LOW (ref 4.22–5.81)
RDW: 13.5 % (ref 11.5–15.5)
WBC: 11.7 10*3/uL — AB (ref 4.0–10.5)

## 2017-05-07 LAB — GLUCOSE, CAPILLARY
GLUCOSE-CAPILLARY: 177 mg/dL — AB (ref 65–99)
GLUCOSE-CAPILLARY: 310 mg/dL — AB (ref 65–99)
Glucose-Capillary: 132 mg/dL — ABNORMAL HIGH (ref 65–99)
Glucose-Capillary: 144 mg/dL — ABNORMAL HIGH (ref 65–99)
Glucose-Capillary: 156 mg/dL — ABNORMAL HIGH (ref 65–99)
Glucose-Capillary: 181 mg/dL — ABNORMAL HIGH (ref 65–99)

## 2017-05-07 LAB — BASIC METABOLIC PANEL
ANION GAP: 6 (ref 5–15)
BUN: 6 mg/dL (ref 6–20)
CALCIUM: 8.1 mg/dL — AB (ref 8.9–10.3)
CO2: 25 mmol/L (ref 22–32)
Chloride: 101 mmol/L (ref 101–111)
Creatinine, Ser: 0.69 mg/dL (ref 0.61–1.24)
Glucose, Bld: 138 mg/dL — ABNORMAL HIGH (ref 65–99)
Potassium: 3.4 mmol/L — ABNORMAL LOW (ref 3.5–5.1)
Sodium: 132 mmol/L — ABNORMAL LOW (ref 135–145)

## 2017-05-07 SURGERY — LAPAROSCOPIC CHOLECYSTECTOMY
Anesthesia: General | Site: Abdomen

## 2017-05-07 MED ORDER — SODIUM CHLORIDE 0.9 % IR SOLN
Status: DC | PRN
  Administered 2017-05-07: 1

## 2017-05-07 MED ORDER — ONDANSETRON HCL 4 MG/2ML IJ SOLN
INTRAMUSCULAR | Status: AC
  Filled 2017-05-07: qty 2

## 2017-05-07 MED ORDER — LIDOCAINE 2% (20 MG/ML) 5 ML SYRINGE
INTRAMUSCULAR | Status: DC | PRN
  Administered 2017-05-07: 55 mg via INTRAVENOUS
  Administered 2017-05-07: 45 mg via INTRAVENOUS

## 2017-05-07 MED ORDER — ROCURONIUM BROMIDE 10 MG/ML (PF) SYRINGE
PREFILLED_SYRINGE | INTRAVENOUS | Status: AC
  Filled 2017-05-07: qty 5

## 2017-05-07 MED ORDER — PROPOFOL 10 MG/ML IV BOLUS
INTRAVENOUS | Status: DC | PRN
  Administered 2017-05-07: 200 mg via INTRAVENOUS

## 2017-05-07 MED ORDER — FENTANYL CITRATE (PF) 250 MCG/5ML IJ SOLN
INTRAMUSCULAR | Status: AC
  Filled 2017-05-07: qty 5

## 2017-05-07 MED ORDER — LACTATED RINGERS IV SOLN
INTRAVENOUS | Status: DC
  Administered 2017-05-07 (×3): via INTRAVENOUS

## 2017-05-07 MED ORDER — DEXAMETHASONE SODIUM PHOSPHATE 10 MG/ML IJ SOLN
INTRAMUSCULAR | Status: AC
  Filled 2017-05-07: qty 1

## 2017-05-07 MED ORDER — ALBUMIN HUMAN 5 % IV SOLN
INTRAVENOUS | Status: DC | PRN
  Administered 2017-05-07: 14:00:00 via INTRAVENOUS

## 2017-05-07 MED ORDER — SUGAMMADEX SODIUM 200 MG/2ML IV SOLN
INTRAVENOUS | Status: DC | PRN
  Administered 2017-05-07: 200 mg via INTRAVENOUS

## 2017-05-07 MED ORDER — FENTANYL CITRATE (PF) 100 MCG/2ML IJ SOLN: 25.0000 ug | INTRAMUSCULAR | Status: DC | PRN

## 2017-05-07 MED ORDER — FENTANYL CITRATE (PF) 100 MCG/2ML IJ SOLN
25.0000 ug | INTRAMUSCULAR | Status: DC | PRN
Start: 2017-05-07 — End: 2017-05-07

## 2017-05-07 MED ORDER — 0.9 % SODIUM CHLORIDE (POUR BTL) OPTIME
TOPICAL | Status: DC | PRN
  Administered 2017-05-07: 1000 mL

## 2017-05-07 MED ORDER — LIDOCAINE 2% (20 MG/ML) 5 ML SYRINGE
INTRAMUSCULAR | Status: AC
  Filled 2017-05-07: qty 5

## 2017-05-07 MED ORDER — FENTANYL CITRATE (PF) 250 MCG/5ML IJ SOLN
INTRAMUSCULAR | Status: DC | PRN
  Administered 2017-05-07: 50 ug via INTRAVENOUS
  Administered 2017-05-07 (×2): 25 ug via INTRAVENOUS
  Administered 2017-05-07: 100 ug via INTRAVENOUS
  Administered 2017-05-07 (×3): 50 ug via INTRAVENOUS

## 2017-05-07 MED ORDER — PROPOFOL 10 MG/ML IV BOLUS
INTRAVENOUS | Status: AC
  Filled 2017-05-07: qty 20

## 2017-05-07 MED ORDER — ROCURONIUM BROMIDE 10 MG/ML (PF) SYRINGE
PREFILLED_SYRINGE | INTRAVENOUS | Status: DC | PRN
  Administered 2017-05-07: 10 mg via INTRAVENOUS
  Administered 2017-05-07: 20 mg via INTRAVENOUS
  Administered 2017-05-07: 50 mg via INTRAVENOUS

## 2017-05-07 MED ORDER — SUGAMMADEX SODIUM 200 MG/2ML IV SOLN
INTRAVENOUS | Status: AC
  Filled 2017-05-07: qty 2

## 2017-05-07 MED ORDER — DEXAMETHASONE SODIUM PHOSPHATE 10 MG/ML IJ SOLN
INTRAMUSCULAR | Status: DC | PRN
  Administered 2017-05-07: 10 mg via INTRAVENOUS

## 2017-05-07 MED ORDER — ONDANSETRON HCL 4 MG/2ML IJ SOLN
INTRAMUSCULAR | Status: DC | PRN
  Administered 2017-05-07: 4 mg via INTRAVENOUS

## 2017-05-07 SURGICAL SUPPLY — 53 items
APPLIER CLIP 5 13 M/L LIGAMAX5 (MISCELLANEOUS) ×6
APPLIER CLIP ROT 10 11.4 M/L (STAPLE) ×3
BENZOIN TINCTURE PRP APPL 2/3 (GAUZE/BANDAGES/DRESSINGS) ×3 IMPLANT
BLADE CLIPPER SURG (BLADE) IMPLANT
CANISTER SUCT 3000ML PPV (MISCELLANEOUS) ×3 IMPLANT
CHLORAPREP W/TINT 26ML (MISCELLANEOUS) ×3 IMPLANT
CLIP APPLIE 5 13 M/L LIGAMAX5 (MISCELLANEOUS) ×2 IMPLANT
CLIP APPLIE ROT 10 11.4 M/L (STAPLE) ×1 IMPLANT
CLOSURE WOUND 1/2 X4 (GAUZE/BANDAGES/DRESSINGS) ×1
COVER SURGICAL LIGHT HANDLE (MISCELLANEOUS) ×3 IMPLANT
DRAIN CHANNEL 19F RND (DRAIN) ×3 IMPLANT
DRSG TEGADERM 2-3/8X2-3/4 SM (GAUZE/BANDAGES/DRESSINGS) ×9 IMPLANT
DRSG TEGADERM 4X4.75 (GAUZE/BANDAGES/DRESSINGS) ×3 IMPLANT
ELECT REM PT RETURN 9FT ADLT (ELECTROSURGICAL) ×3
ELECTRODE REM PT RTRN 9FT ADLT (ELECTROSURGICAL) ×1 IMPLANT
EVACUATOR SILICONE 100CC (DRAIN) ×3 IMPLANT
FILTER SMOKE EVAC LAPAROSHD (FILTER) ×3 IMPLANT
GAUZE SPONGE 2X2 8PLY STRL LF (GAUZE/BANDAGES/DRESSINGS) ×1 IMPLANT
GLOVE BIO SURGEON STRL SZ7 (GLOVE) ×3 IMPLANT
GLOVE BIOGEL PI IND STRL 7.5 (GLOVE) ×1 IMPLANT
GLOVE BIOGEL PI INDICATOR 7.5 (GLOVE) ×2
GOWN STRL REUS W/ TWL LRG LVL3 (GOWN DISPOSABLE) ×3 IMPLANT
GOWN STRL REUS W/TWL LRG LVL3 (GOWN DISPOSABLE) ×6
KIT BASIN OR (CUSTOM PROCEDURE TRAY) ×3 IMPLANT
KIT ROOM TURNOVER OR (KITS) ×3 IMPLANT
NS IRRIG 1000ML POUR BTL (IV SOLUTION) ×3 IMPLANT
PAD ARMBOARD 7.5X6 YLW CONV (MISCELLANEOUS) ×3 IMPLANT
POUCH RETRIEVAL ECOSAC 10 (ENDOMECHANICALS) ×1 IMPLANT
POUCH RETRIEVAL ECOSAC 10MM (ENDOMECHANICALS) ×2
POUCH SPECIMEN RETRIEVAL 10MM (ENDOMECHANICALS) IMPLANT
SCISSORS LAP 5X35 DISP (ENDOMECHANICALS) ×3 IMPLANT
SET IRRIG TUBING LAPAROSCOPIC (IRRIGATION / IRRIGATOR) ×3 IMPLANT
SLEEVE ENDOPATH XCEL 5M (ENDOMECHANICALS) ×6 IMPLANT
SPECIMEN JAR SMALL (MISCELLANEOUS) ×3 IMPLANT
SPONGE GAUZE 2X2 STER 10/PKG (GAUZE/BANDAGES/DRESSINGS) ×2
STRIP CLOSURE SKIN 1/2X4 (GAUZE/BANDAGES/DRESSINGS) ×2 IMPLANT
SUT ETHILON 2 0 FS 18 (SUTURE) ×3 IMPLANT
SUT MNCRL AB 4-0 PS2 18 (SUTURE) ×6 IMPLANT
SUT SILK 2 0 (SUTURE) ×2
SUT SILK 2 0 SH CR/8 (SUTURE) ×3 IMPLANT
SUT SILK 2 0 TIES 10X30 (SUTURE) ×3 IMPLANT
SUT SILK 2-0 18XBRD TIE 12 (SUTURE) ×1 IMPLANT
SUT SILK 3 0 SH CR/8 (SUTURE) ×3 IMPLANT
TOWEL OR 17X24 6PK STRL BLUE (TOWEL DISPOSABLE) ×3 IMPLANT
TOWEL OR 17X26 10 PK STRL BLUE (TOWEL DISPOSABLE) ×3 IMPLANT
TRAY LAPAROSCOPIC MC (CUSTOM PROCEDURE TRAY) ×3 IMPLANT
TROCAR XCEL BLUNT TIP 100MML (ENDOMECHANICALS) ×3 IMPLANT
TROCAR XCEL NON-BLD 11X100MML (ENDOMECHANICALS) ×3 IMPLANT
TROCAR XCEL NON-BLD 5MMX100MML (ENDOMECHANICALS) ×3 IMPLANT
TUBE CONNECTING 12'X1/4 (SUCTIONS) ×1
TUBE CONNECTING 12X1/4 (SUCTIONS) ×2 IMPLANT
TUBING INSUFFLATION (TUBING) ×3 IMPLANT
YANKAUER SUCT BULB TIP NO VENT (SUCTIONS) ×3 IMPLANT

## 2017-05-07 NOTE — Transfer of Care (Signed)
Immediate Anesthesia Transfer of Care Note  Patient: Tyler Bridges  Procedure(s) Performed: LAPAROSCOPIC CHOLECYSTECTOMY (N/A Abdomen)  Patient Location: PACU  Anesthesia Type:General  Level of Consciousness: awake and alert   Airway & Oxygen Therapy: Patient Spontanous Breathing and Patient connected to nasal cannula oxygen  Post-op Assessment: Report given to RN and Post -op Vital signs reviewed and stable  Post vital signs: Reviewed and stable  Last Vitals:  Vitals:   05/07/17 0440 05/07/17 1424  BP: 134/67   Pulse: 75 73  Resp:  18  Temp: 37.4 C 36.8 C  SpO2: 93% 90%    Last Pain:  Vitals:   05/07/17 0833  TempSrc:   PainSc: 5       Patients Stated Pain Goal: 0 (05/07/17 [id number])  Complications: No apparent anesthesia complications

## 2017-05-07 NOTE — Anesthesia Preprocedure Evaluation (Addendum)
Anesthesia Evaluation  Patient identified by MRN, date of birth, ID band Patient awake    Reviewed: Allergy & Precautions, NPO status , Patient's Chart, lab work & pertinent test results  Airway Mallampati: II  TM Distance: >3 FB     Dental   Pulmonary former smoker,    breath sounds clear to auscultation       Cardiovascular hypertension, + Peripheral Vascular Disease   Rhythm:Regular     Neuro/Psych    GI/Hepatic Neg liver ROS, GERD  ,  Endo/Other  diabetes  Renal/GU negative Renal ROS     Musculoskeletal   Abdominal   Peds  Hematology   Anesthesia Other Findings   Reproductive/Obstetrics                             Anesthesia Physical Anesthesia Plan  ASA: III  Anesthesia Plan: General   Post-op Pain Management:    Induction: Intravenous  PONV Risk Score and Plan: 2 and Treatment may vary due to age or medical condition, Ondansetron, Dexamethasone and Midazolam  Airway Management Planned: Oral ETT  Additional Equipment:   Intra-op Plan:   Post-operative Plan: Extubation in OR  Informed Consent: I have reviewed the patients History and Physical, chart, labs and discussed the procedure including the risks, benefits and alternatives for the proposed anesthesia with the patient or authorized representative who has indicated his/her understanding and acceptance.   Dental advisory given  Plan Discussed with: CRNA and Anesthesiologist  Anesthesia Plan Comments:        Anesthesia Quick Evaluation

## 2017-05-07 NOTE — Progress Notes (Signed)
   Subjective: The patient was seen walking around his room comfortably in no acute distress. Knows he will have surgery later today. States his pain was well controlled on new pain medicine overnight. NO acute complaints this AM.  Objective:  Vital signs in last 24 hours: Vitals:   05/06/17 2011 05/07/17 0055 05/07/17 0440 05/07/17 1017  BP: (!) 147/80  134/67   Pulse: 85  75   Resp: 14     Temp: (!) 100.7 F (38.2 C) 98.9 F (37.2 C) 99.3 F (37.4 C)   TempSrc: Oral Oral Oral   SpO2: 95%  93%   Weight:   207 lb 14.4 oz (94.3 kg) 207 lb 14.4 oz (94.3 kg)  Height:    5' 7.99" (1.727 m)   Physical Exam  Constitutional: He is oriented to person, place, and time. No distress.  Patient appears more comfortable today than on previous days.  GI: There is no tenderness.  Musculoskeletal: Edema: of bilateral lower extremities. Tenderness: of bilateral lower extremities.  Neurological: He is alert and oriented to person, place, and time.  Gross movement of all 4 extremities intact.  Skin: Skin is warm and dry. No rash noted. He is not diaphoretic. No erythema.  Psychiatric: He has a normal mood and affect. His behavior is normal. Thought content normal.   Assessment/Plan:  Principal Problem:   Cholecystitis, acute Active Problems:   Essential hypertension   Chest pain   Midepigastric pain  Karleen DolphinCharles Trier is a 71 yo male with PMH of HTN, DM, and previous SBOs who presents with acute onset epigastric pain. His workup in the ED was concerning for acute cholecystitis and the patient was empirically started on antibiotics. He was admitted to the internal medicine teaching service for management with surgery consulting. The specific problems addressed during his admission were as follows:  Acute cholecystitis: Patient's pain better controlled with dilaudid yesterday. Will continue current pain regimen. WBC's down trending, currently afebrile. Will continue antibiotics today and monitor fever  curve post-op. -NPO today, surgery later today -Hold aspirin and lovenox for surgery today -Dilaudid 1 mg q3 hours and ondansetron 4 mg PRN  -Ceftriaxone, zosyn per pharmacy   HTN: Patient currently normotensive and hemodynamically stable -Hold home lisinopril, 20 mg  -Continue to monitor  T2DM: -Hold home metformin -SSI-moderate while inpatient (q4 hours while NPO)  F/E/N, GI: -NS @125  ml/hr -NPO as above -Home pantroprazole 40 mg BID  VTE prophylaxis: Holding for surgery today  Dispo: Anticipated discharge pending cholecystectomy  Rozann LeschesNedrud, Parminder Cupples, MD 05/07/2017, 11:16 AM Pager: [phone]

## 2017-05-07 NOTE — Op Note (Signed)
Laparoscopic lysis of adhesions (60 minutes)/ laparoscopic Cholecystectomy Procedure Note  Indications: This patient presents with acute cholecystitis and will undergo laparoscopic cholecystectomy.  He has had multiple previous abdominal surgeries and ventral hernia repairs, so there is a higher than usual chance of need for conversion to open procedure.  Pre-operative Diagnosis: Calculus of gallbladder with acute cholecystitis, without mention of obstruction; extensive intra-abdominal adhesions  Post-operative Diagnosis: Same  Surgeon: Wynona LunaMatthew K Maximiano Lott   Assistants: Dr. Axel FillerArmando Ramirez  Anesthesia: General endotracheal anesthesia  ASA Class: 2  Procedure Details  The patient was seen again in the Holding Room. The risks, benefits, complications, treatment options, and expected outcomes were discussed with the patient. The possibilities of reaction to medication, pulmonary aspiration, perforation of viscus, bleeding, recurrent infection, finding a normal gallbladder, the need for additional procedures, failure to diagnose a condition, the possible need to convert to an open procedure, and creating a complication requiring transfusion or operation were discussed with the patient. The likelihood of improving the patient's symptoms with return to their baseline status is good.  The patient and/or family concurred with the proposed plan, giving informed consent. The site of surgery properly noted. The patient was taken to Operating Room, identified as Cassie Freerharles L Ehler and the procedure verified as Laparoscopic lysis of adhesions/ Cholecystectomy with Intraoperative Cholangiogram. A Time Out was held and the above information confirmed.  Prior to the induction of general anesthesia, antibiotic prophylaxis was administered. General endotracheal anesthesia was then administered and tolerated well. After the induction, the abdomen was prepped with Chloraprep and draped in sterile fashion. The patient was  positioned in the supine position.  Local anesthetic agent was injected into the skin in the LUQ below the costal margin and an incision made. An optiview trocar was used to cannulate the peritoneal cavity.  We insufflated CO2 maintaining a maximum pressure of 15 mm Hg.  The laparoscope was inserted.  We encountered a lot of adhesions in the midline extending down towards the pelvis.  However there were no adhesions laterally in the left side of the abdomen.  We placed 2 additional 5 mm ports in the left lower quadrant.  We then spent about 70 minutes taking down adhesions.  We were able to use a combination of blunt as well as sharp dissection to dissect the omentum and the small bowel away from the overlying mesh.  We dissected across towards the right side.  We did not dissect down all of the adhesions in the pelvis as there is no need for this for this surgery.  Once we had cleared the abdominal wall above the umbilicus we began the cholecystectomy portion of the procedure.   An 11-mm port was placed at the level of the umbilicus in the left lower quadrant.  Two 5-mm ports were placed in the right upper quadrant. All skin incisions were infiltrated with a local anesthetic agent before making the incision and placing the trocars.   We positioned the patient in reverse Trendelenburg, tilted slightly to the patient's left.  The transverse colon and omentum were densely adherent to the liver and the inflamed gallbladder.  We were able to bluntly dissect the omentum away from the gallbladder.  The gallbladder was decompressed with the suction aspirator.  The gallbladder was identified, the fundus grasped and retracted cephalad. Adhesions were lysed bluntly and with the electrocautery where indicated, taking care not to injure any adjacent organs or viscus. The infundibulum was grasped and retracted laterally, exposing the peritoneum overlying the  triangle of Calot. This was then divided and exposed in a blunt  fashion. The cystic duct was clearly identified and bluntly dissected circumferentially. The cystic duct was then ligated with clips and divided. The cystic artery was, dissected free, ligated with clips and divided as well.   The gallbladder was dissected from the liver bed in retrograde fashion with the electrocautery.  As we were dissecting the gallbladder away from the liver, we encountered significant bleeding.  We were able to identify the bleeding vessel and were able to ligated with clips.  This appeared to be a posterior branch of the cystic artery.  Once we were satisfied with our hemostasis, we continued the remainder of the gallbladder dissection.  The gallbladder was removed and placed in an Eco sac. The liver bed was irrigated and inspected. Hemostasis was achieved with the electrocautery. Copious irrigation was utilized and was repeatedly aspirated until clear.  The gallbladder and Eco sac were then removed through the periumbilical port site.  We had to enlarge the incision to allow removal of the gallbladder.  The fascia at the periumbilical port site was closed with 0 Vicryl.  We placed a drain into the abdomen exiting on the right side.  This was secured with 2-0 nylon.  We again inspected the right upper quadrant for hemostasis.  Pneumoperitoneum was released as we removed the trocars.  4-0 Monocryl was used to close the skin.   Benzoin, steri-strips, and clean dressings were applied. The patient was then extubated and brought to the recovery room in stable condition. Instrument, sponge, and needle counts were correct at closure and at the conclusion of the case.   Findings: Severe acute Cholecystitis with Cholelithiasis  Estimated Blood Loss: 200 mL         Drains: JP drain in RUQ         Specimens: Gallbladder           Complications: None; patient tolerated the procedure well.         Disposition: PACU - hemodynamically stable.         Condition: stable  Wilmon ArmsMatthew K. Corliss Skainssuei,  MD, [facility]  General/ Trauma Surgery  05/07/2017 2:21 PM

## 2017-05-07 NOTE — Anesthesia Postprocedure Evaluation (Signed)
Anesthesia Post Note  Patient: Tyler Bridges  Procedure(s) Performed: LAPAROSCOPIC CHOLECYSTECTOMY (N/A Abdomen)     Patient location during evaluation: PACU Anesthesia Type: General Level of consciousness: awake Pain management: pain level controlled Vital Signs Assessment: post-procedure vital signs reviewed and stable Respiratory status: spontaneous breathing Cardiovascular status: stable Anesthetic complications: no    Last Vitals:  Vitals:   05/07/17 1440 05/07/17 1455  BP: 133/73 (!) 146/86  Pulse: 77 77  Resp: 17 17  Temp:    SpO2: 92% 92%    Last Pain:  Vitals:   05/07/17 1424  TempSrc:   PainSc: Asleep                 Zeb Rawl

## 2017-05-07 NOTE — Progress Notes (Signed)
Medicine attending: I agree with the assessment and management plan as recorded by resident physician Dr. Rozann LeschesMarybeth Nedrud. Brief visit with patient who was doing his morning toilet.  He feels well.  Clinically stable to proceed with cholecystectomy later today.  Discussed status with his surgeon who is present.  Will likely need an open procedure due to prior abdominal surgeries and mesh placement.  Continue current antibiotic regimen.

## 2017-05-07 NOTE — Anesthesia Procedure Notes (Addendum)
Procedure Name: Intubation Date/Time: 05/07/2017 11:57 AM Performed by: Rachel MouldsLee, Murrell Elizondo B, CRNA Pre-anesthesia Checklist: Patient identified, Emergency Drugs available, Suction available, Timeout performed and Patient being monitored Patient Re-evaluated:Patient Re-evaluated prior to induction Oxygen Delivery Method: Circle system utilized Preoxygenation: Pre-oxygenation with 100% oxygen Induction Type: IV induction Ventilation: Mask ventilation without difficulty and Oral airway inserted - appropriate to patient size Grade View: Grade I Tube type: Oral Tube size: 7.5 mm Number of attempts: 1 Airway Equipment and Method: Stylet Placement Confirmation: ETT inserted through vocal cords under direct vision,  positive ETCO2,  CO2 detector and breath sounds checked- equal and bilateral Secured at: 21 cm Tube secured with: Tape Dental Injury: Teeth and Oropharynx as per pre-operative assessment

## 2017-05-07 NOTE — Progress Notes (Signed)
Day of Surgery   Subjective/Chief Complaint: Patient is still having some mild upper abdominal pain No nausea or vomiting Eager to have surgery  Objective: Vital signs in last 24 hours: Temp:  [97.7 F (36.5 C)-100.7 F (38.2 C)] 99.3 F (37.4 C) (12/31 0440) Pulse Rate:  [75-85] 75 (12/31 0440) Resp:  [14-22] 14 (12/30 2011) BP: (134-156)/(67-80) 134/67 (12/31 0440) SpO2:  [93 %-95 %] 93 % (12/31 0440) Weight:  [94.3 kg (207 lb 14.4 oz)] 94.3 kg (207 lb 14.4 oz) (12/31 0440) Last BM Date: 05/04/17  Intake/Output from previous day: 12/30 0701 - 12/31 0700 In: 2144.2 [P.O.:240; I.V.:1254.2; IV Piggyback:150] Out: 1400 [Urine:1400] Intake/Output this shift: Total I/O In: 0  Out: 300 [Urine:300]  General appearance: alert, cooperative and no distress No jaundice or scleral icterus Abd - mild RUQ tenderness  Lab Results:  Recent Labs    05/06/17 0424 05/07/17 0414  WBC 15.6* 11.7*  HGB 11.9* 11.4*  HCT 36.1* 35.0*  PLT 220 214   BMET Recent Labs    05/06/17 0424 05/07/17 0414  NA 130* 132*  K 3.5 3.4*  CL 98* 101  CO2 22 25  GLUCOSE 153* 138*  BUN 5* 6  CREATININE 0.64 0.69  CALCIUM 8.0* 8.1*   Hepatic Function Latest Ref Rng & Units 05/04/2017 11/13/2016 01/17/2015  Total Protein 6.5 - 8.1 g/dL 7.9 8.5(H) 7.1  Albumin 3.5 - 5.0 g/dL 4.2 4.9 3.5  AST 15 - 41 U/L 23 23 36  ALT 17 - 63 U/L 22 20 16(L)  Alk Phosphatase 38 - 126 U/L 72 76 87  Total Bilirubin 0.3 - 1.2 mg/dL 0.8 1.0 1.7(H)  Bilirubin, Direct 0.1 - 0.5 mg/dL 0.1 - -    PT/INR Recent Labs    05/06/17 0424  LABPROT 14.1  INR 1.10   ABG No results for input(s): PHART, HCO3 in the last 72 hours.  Invalid input(s): PCO2, PO2  Studies/Results: No results found.  Anti-infectives: Anti-infectives (From admission, onward)   Start     Dose/Rate Route Frequency Ordered Stop   05/06/17 0600  cefTRIAXone (ROCEPHIN) 2 g in dextrose 5 % 50 mL IVPB  Status:  Discontinued     2 g 100 mL/hr  over 30 Minutes Intravenous Every 24 hours 05/05/17 0606 05/05/17 1255   05/05/17 1400  piperacillin-tazobactam (ZOSYN) IVPB 3.375 g     3.375 g 12.5 mL/hr over 240 Minutes Intravenous Every 8 hours 05/05/17 1255     05/05/17 0615  cefTRIAXone (ROCEPHIN) 2 g in dextrose 5 % 50 mL IVPB     2 g 100 mL/hr over 30 Minutes Intravenous  Once 05/05/17 0606 05/05/17 0657   05/05/17 0600  metroNIDAZOLE (FLAGYL) IVPB 500 mg  Status:  Discontinued     500 mg 100 mL/hr over 60 Minutes Intravenous Every 8 hours 05/05/17 0506 05/05/17 1255      Assessment/Plan: Acute cholecystitis  Previous multiple abdominal surgeries  Plan laparoscopic cholecystectomy/ possible open cholecystectomy today.  The surgical procedure has been discussed with the patient.  Potential risks, benefits, alternative treatments, and expected outcomes have been explained.  All of the patient's questions at this time have been answered.  The likelihood of reaching the patient's treatment goal is good.  The patient understand the proposed surgical procedure and wishes to proceed.   LOS: 2 days    Tyler Bridges 05/07/2017

## 2017-05-07 NOTE — Progress Notes (Signed)
1605 Received pt from PACU, A&O x4. Lap sites to abd with dressing dry and intact, JP drain in place. Medicated for pain.

## 2017-05-08 ENCOUNTER — Encounter (HOSPITAL_COMMUNITY): Payer: Self-pay | Admitting: Surgery

## 2017-05-08 LAB — GLUCOSE, CAPILLARY
GLUCOSE-CAPILLARY: 247 mg/dL — AB (ref 65–99)
GLUCOSE-CAPILLARY: 263 mg/dL — AB (ref 65–99)
GLUCOSE-CAPILLARY: 291 mg/dL — AB (ref 65–99)
Glucose-Capillary: 225 mg/dL — ABNORMAL HIGH (ref 65–99)
Glucose-Capillary: 203 mg/dL — ABNORMAL HIGH (ref 65–99)
Glucose-Capillary: 280 mg/dL — ABNORMAL HIGH (ref 65–99)

## 2017-05-08 LAB — CBC
HEMATOCRIT: 31.3 % — AB (ref 39.0–52.0)
Hemoglobin: 10.3 g/dL — ABNORMAL LOW (ref 13.0–17.0)
MCH: 27.8 pg (ref 26.0–34.0)
MCHC: 32.9 g/dL (ref 30.0–36.0)
MCV: 84.4 fL (ref 78.0–100.0)
Platelets: 265 10*3/uL (ref 150–400)
RBC: 3.71 MIL/uL — AB (ref 4.22–5.81)
RDW: 13.4 % (ref 11.5–15.5)
WBC: 10 10*3/uL (ref 4.0–10.5)

## 2017-05-08 LAB — COMPREHENSIVE METABOLIC PANEL
ALT: 50 U/L (ref 17–63)
AST: 60 U/L — AB (ref 15–41)
Albumin: 2.7 g/dL — ABNORMAL LOW (ref 3.5–5.0)
Alkaline Phosphatase: 77 U/L (ref 38–126)
Anion gap: 10 (ref 5–15)
BUN: 8 mg/dL (ref 6–20)
CHLORIDE: 99 mmol/L — AB (ref 101–111)
CO2: 24 mmol/L (ref 22–32)
Calcium: 8.2 mg/dL — ABNORMAL LOW (ref 8.9–10.3)
Creatinine, Ser: 0.6 mg/dL — ABNORMAL LOW (ref 0.61–1.24)
Glucose, Bld: 232 mg/dL — ABNORMAL HIGH (ref 65–99)
POTASSIUM: 3.9 mmol/L (ref 3.5–5.1)
SODIUM: 133 mmol/L — AB (ref 135–145)
Total Bilirubin: 0.8 mg/dL (ref 0.3–1.2)
Total Protein: 6.6 g/dL (ref 6.5–8.1)

## 2017-05-08 MED ORDER — OXYCODONE HCL 5 MG PO TABS
5.0000 mg | ORAL_TABLET | ORAL | Status: AC | PRN
  Administered 2017-05-08 – 2017-05-09 (×2): 5 mg via ORAL
  Filled 2017-05-08 (×2): qty 1

## 2017-05-08 MED ORDER — SENNOSIDES-DOCUSATE SODIUM 8.6-50 MG PO TABS: 1.0000 | ORAL_TABLET | Freq: Two times a day (BID) | ORAL | Status: DC | PRN

## 2017-05-08 MED ORDER — HYDROMORPHONE HCL 2 MG PO TABS
2.0000 mg | ORAL_TABLET | ORAL | Status: DC | PRN
  Administered 2017-05-08 (×2): 2 mg via ORAL
  Filled 2017-05-08 (×2): qty 1

## 2017-05-08 MED ORDER — INSULIN ASPART 100 UNIT/ML ~~LOC~~ SOLN
4.0000 [IU] | Freq: Three times a day (TID) | SUBCUTANEOUS | Status: DC
  Administered 2017-05-09 (×2): 4 [IU] via SUBCUTANEOUS

## 2017-05-08 MED ORDER — INSULIN ASPART 100 UNIT/ML ~~LOC~~ SOLN
0.0000 [IU] | Freq: Three times a day (TID) | SUBCUTANEOUS | Status: DC
  Administered 2017-05-08: 8 [IU] via SUBCUTANEOUS
  Administered 2017-05-09 (×2): 3 [IU] via SUBCUTANEOUS

## 2017-05-08 NOTE — Progress Notes (Signed)
Pt was constipated paged on call IM. 

## 2017-05-08 NOTE — Progress Notes (Addendum)
   Subjective:  Patient seen sitting comfortably in bed this AM in no acute distress. States he feels much better this AM and that he has been passing gas easily. Reports serosanguinous drainage in JP drain and some pain around site, but nothing as bad as before surgery. Patient feels well enough to ambulate independently today.  Objective:  Vital signs in last 24 hours: Vitals:   05/07/17 1607 05/07/17 2024 05/08/17 0428 05/08/17 0939  BP: 137/66 138/76 (!) 119/57 134/66  Pulse: 76 74 66 64  Resp: 18 18 17 16   Temp: 98.3 F (36.8 C) (!) 97.5 F (36.4 C) 97.6 F (36.4 C) 98.2 F (36.8 C)  TempSrc: Oral Oral  Oral  SpO2: 93% 93% 93% 100%  Weight:      Height:       Physical Exam  Constitutional: No distress.  Patient appears very comfortable today  HENT:  Mouth/Throat: Oropharynx is clear and moist.  Cardiovascular: Normal rate, regular rhythm and intact distal pulses. Exam reveals no friction rub.  No murmur heard. Respiratory: Effort normal and breath sounds normal. No respiratory distress. He has no wheezes.  GI: Soft. Bowel sounds are normal. He exhibits no distension. There is no tenderness. There is no rebound.  Well healed surgical scars on abdomen with clean, dry dressings over recent lap sites. JP drain in place with clean dry bandage.  Musculoskeletal: He exhibits no edema (of bilateral lower extremities) or tenderness (of bilateral lower extremities).  Skin: Skin is warm and dry. No rash noted. He is not diaphoretic. No erythema.   Assessment/Plan:  Principal Problem:   Cholecystitis, acute Active Problems:   Essential hypertension   Chest pain   Midepigastric pain  Tyler DolphinCharles Bridges is a 72 yo male with PMH of HTN, DM, and previous SBOs who presents with acute onset epigastric pain. His workup in the ED was concerning for acute cholecystitis and the patient was empirically started on antibiotics. He was admitted to the internal medicine teaching service for  management with surgery consulting. The specific problems addressed during his admission were as follows:  Acute cholecystitis s/p laparoscopic cholecystectomy on 05/07/2017: Patient's pain much improved after surgery and no difficulties with pain control overnight. Patient passing gas and tolerating clear liquid diet without n/v. Patient encouraged to ambulate independently today and continue to eat as tolerated. Switching medications to PO since tolerating diet well.  -Surgery consulted, recommendations appreciated -Advance diet as tolerated -Oral Dilaudid 2 mg q4 hours and ondansetron 4 mg PRN  -D/C antibiotics -Senna-docusate, miralax PRN available for constipation  HTN: Patient currently normotensive and hemodynamically stable -Hold home lisinopril, 20 mg  -Continue to monitor  T2DM: -Hold home metformin -SSI-moderate while inpatient (q4 hours while NPO)  F/E/N, GI: -NS @125  ml/hr -NPO as above -Home pantroprazole 40 mg BID  VTE prophylaxis: SCD's, ambulate as tolerated  Dispo: Anticipated discharge 1-2 days.  Rozann LeschesNedrud, Mahmoud Blazejewski, MD 05/08/2017, 11:04 AM Pager: [phone]

## 2017-05-08 NOTE — Progress Notes (Signed)
Medicine attending: Patient status and clinical database reviewed and I concur with the evaluation and management plan as recorded by resident physician Dr. [name]. The patient underwent uncomplicated laparoscopic cholecystectomy and lysis of adhesions yesterday. Abdominal exam is stable.  Tyler Bridges is afebrile.  Diet will be advanced.  Likely home tomorrow.

## 2017-05-08 NOTE — Discharge Summary (Signed)
Name: Tyler Bridges MRN: [id number] DOB: Apr 21, 1946 72 y.o. PCP: Doneen PoissonKlima, Lawrence, MD  Date of Admission: 05/04/2017  3:00 PM Date of Discharge:  Attending Physician: Levert FeinsteinGranfortuna, James M, MD  Discharge Diagnosis:  Principal Problem:   Cholecystitis, acute Active Problems:   Essential hypertension   Chest pain   Midepigastric pain  Discharge Medications: Allergies as of 05/09/2017      Reactions   Atorvastatin Other (See Comments)   Myalgias   Percocet [oxycodone-acetaminophen] Itching   Propoxyphene N-acetaminophen Itching      Medication List    TAKE these medications   amoxicillin-clavulanate 875-125 MG tablet Commonly known as:  AUGMENTIN Take 1 tablet by mouth every 12 (twelve) hours for 3 days.   aspirin 81 MG EC tablet Take 81 mg by mouth every morning.   BLUE-EMU SUPER STRENGTH EX Apply 1 application topically as needed (for pain).   etodolac 400 MG tablet Commonly known as:  LODINE Take 1 tablet (400 mg total) every 12 (twelve) hours as needed by mouth for moderate pain.   glucose blood test strip Commonly known as:  ONE TOUCH ULTRA TEST Use as instructed to check blood sugar once daily. Diag code E11.9 (Non-insulin dependent)   lisinopril 20 MG tablet Commonly known as:  PRINIVIL,ZESTRIL Take 1 tablet (20 mg total) by mouth daily.   metFORMIN 500 MG 24 hr tablet Commonly known as:  GLUCOPHAGE-XR TAKE 3 TABLETS BY MOUTH DAILY. What changed:    how much to take  how to take this  when to take this   oxyCODONE 5 MG immediate release tablet Commonly known as:  Oxy IR/ROXICODONE Take 1-2 tablets every 4 hours as needed for abdominal pain.   pantoprazole 40 MG tablet Commonly known as:  PROTONIX Take 1 tablet (40 mg total) by mouth 2 (two) times daily.   polyethylene glycol packet Commonly known as:  MIRALAX / GLYCOLAX Take 17 g by mouth daily as needed for mild constipation.   senna-docusate 8.6-50 MG tablet Commonly known as:   Senokot-S Take 1-2 tablets by mouth 2 (two) times daily as needed for mild constipation.      Disposition and follow-up:   [facility]  At the hospital follow up visit please address:  1.  Patient presented with acute onset epigastric pain which was initially thought to be 2/2 ACS but workup in the ED was negative for EKG changes/troponin, but positive for cholecystitis. The patient had further imaging which confirmed diagnosis and underwent laparoscopic cholecystectomy on HD #3. The patient tolerated the procedure well and was discharged with outpatient follow up. Please assess abdominal pain and wound healing. Please also assess for signs/symptoms of infection.   2.  Labs / imaging needed at time of follow-up: None  3.  Pending labs/ test needing follow-up: None  Follow-up Appointments: Follow-up Information    [facility] [place] Go on 05/15/2017.   Specialty:  General Surgery Why:  Your appointment is 05/15/16 at 10:30am for drain check. Please arrive 30 minutes prior to your appointment to check in and fill out paperwork. Contact information: [address] [place] [id number] [phone]       Doneen PoissonKlima, Lawrence, MD Follow up.   Specialty:  Internal Medicine Why:  Clinic will call you with an appointment  Contact information: [address] [place] [phone]          Hospital Course  by problem list: Principal Problem:   Cholecystitis, acute Active Problems:   Essential hypertension   Chest pain   Midepigastric pain   Tyler Bridges is a 72 yo male with PMH of HTN, DM, and previous SBOs who presents with acute onset epigastric pain. His workup in the ED was concerning for acute cholecystitis and the patient was admitted to the internal medicine teaching service for management with surgery consulting. The specific problems addressed during his  admission were as follows:  Acute cholecystitis s/p laparoscopic cholecystectomy on 05/07/2017: The patient's imaging, including a CT scan and RUQ ultrasound, confirmed cholecystitis and surgery was consulted on admission. On admission he was afebrile and hemodynamically stable, but found to have a leukocytosis to 19.7. He was started on antibiotics, ceftriaxone and zosyn, which were continued until surgery on HD #3. The patient underwent an uncomplicated laparoscopic cholecystectomy on 05/07/2017 and was hemodynamically stable post op. On post op day #1 the patient's pain was well tolerated with oral medications and he was tolerating a clear liquid diet without difficulty. His WBC had normalized at this time and antibiotics were discontinued. On post op day #2 the patient was ambulating independently without difficulty. The patient was discharged to home in stable condition with surgery follow up scheduled on 05/15/17 to remove JP drain. Return precautions and work restrictions given.   HTN: Patient's home lisinopril 20 mg was held during admission 2/2 NPO status. He remained normotensive throughout hospitalization and this medicine was continued on discharge.   T2DM: Patient's home metformin was held during admission 2/2 NPO status. Problem well controlled at baseline (Hgb A1C = 7.3%) and BG well controlled on SSI TID with meals while inpatient. The patient's usual home oral diabetes medications were restarted on discharge.  Discharge Vitals:   BP 132/74   Pulse 66   Temp 98.4 F (36.9 C)   Resp 16   Ht 5' 7.99" (1.727 m)   Wt 207 lb 14.4 oz (94.3 kg)   SpO2 96%   BMI 31.62 kg/m   Pertinent Labs, Studies, and Procedures:   CMP CMP Latest Ref Rng & Units 05/08/2017 05/07/2017 05/06/2017  Glucose 65 - 99 mg/dL 409(W) 119(J) 478(G)  BUN 6 - 20 mg/dL 8 6 5(L)  Creatinine 9.56 - 1.24 mg/dL 2.13(Y) 8.65 7.84  Sodium 135 - 145 mmol/L 133(L) 132(L) 130(L)  Potassium 3.5 - 5.1 mmol/L 3.9 3.4(L)  3.5  Chloride 101 - 111 mmol/L 99(L) 101 98(L)  CO2 22 - 32 mmol/L 24 25 22   Calcium 8.9 - 10.3 mg/dL 8.2(L) 8.1(L) 8.0(L)  Total Protein 6.5 - 8.1 g/dL 6.6 - -  Total Bilirubin 0.3 - 1.2 mg/dL 0.8 - -  Alkaline Phos 38 - 126 U/L 77 - -  AST 15 - 41 U/L 60(H) - -  ALT 17 - 63 U/L 50 - -   CBC CBC Latest Ref Rng & Units 05/09/2017 05/08/2017 05/07/2017  WBC 4.0 - 10.5 K/uL 8.4 10.0 11.7(H)  Hemoglobin 13.0 - 17.0 g/dL 6.9(G) 10.3(L) 11.4(L)  Hematocrit 39.0 - 52.0 % 29.6(L) 31.3(L) 35.0(L)  Platelets 150 - 400 K/uL 293 265 214   CT abdomen and pelvis: IMPRESSION: 1. Findings concerning for inflammatory changes of the gallbladder. Further evaluation with ultrasound recommended. 2. Sigmoid diverticulosis. No bowel obstruction or active inflammation.  RUQ ultrasound: Gallbladder: No definite gallstones are visualized, however there is diffuse gallbladder wall thickening measuring approximately 9 mm. Linear increased echogenicity with dirty acoustic shadowing in the gallbladder wall likely represents air,  and is suspicious for emphysematous cholecystitis. No evidence of pericholecystic fluid. A few tiny cholesterol polyps incidentally noted. No definite sonographic Murphy sign noted by sonographer.  Common bile duct: diameter: 3 mm, within normal limits.  Liver: No focal lesion identified. Within normal limits in parenchymal echogenicity. Portal vein is patent on color Doppler imaging with normal direction of blood flow towards the liver.  IVC: No abnormality visualized.  Pancreas: Not well visualized due to overlying bowel gas.  Spleen: Size and appearance within normal limits.  Right Kidney: Length: 14.0 cm. Echogenicity within normal limits. No mass or hydronephrosis visualized.  Left Kidney: Length: 13.5 cm. Echogenicity within normal limits. No mass or hydronephrosis visualized.  Discharge Instructions: Discharge Instructions    Call MD for:  persistant nausea  and vomiting   Complete by:  As directed    Call MD for:  redness, tenderness, or signs of infection (pain, swelling, redness, odor or green/yellow discharge around incision site)   Complete by:  As directed    Call MD for:  severe uncontrolled pain   Complete by:  As directed    Call MD for:  temperature >100.4   Complete by:  As directed    Diet - low sodium heart healthy   Complete by:  As directed    Increase activity slowly   Complete by:  As directed      Signed: Levora Dredge, MD 05/09/2017, 12:48 PM   Pager: [phone]

## 2017-05-08 NOTE — Progress Notes (Signed)
1 Day Post-Op   Subjective/Chief Complaint: Pt reports some pain to RUQ at drain site   Objective: Vital signs in last 24 hours: Temp:  [97.5 F (36.4 C)-98.3 F (36.8 C)] 97.6 F (36.4 C) (01/01 0428) Pulse Rate:  [66-78] 66 (01/01 0428) Resp:  [17-21] 17 (01/01 0428) BP: (119-146)/(57-86) 119/57 (01/01 0428) SpO2:  [90 %-93 %] 93 % (01/01 0428) Weight:  [94.3 kg (207 lb 14.4 oz)] 94.3 kg (207 lb 14.4 oz) (12/31 1017) Last BM Date: 05/04/17  Intake/Output from previous day: 12/31 0701 - 01/01 0700 In: 2654.5 [P.O.:820; I.V.:1359.5; IV Piggyback:400] Out: 2640 [Urine:2150; Drains:90; Blood:400] Intake/Output this shift: No intake/output data recorded.  General appearance: alert and cooperative GI: soft, non-tender; bowel sounds normal; no masses,  no organomegaly and inc c/d/i  Lab Results:  Recent Labs    05/07/17 0414 05/08/17 0409  WBC 11.7* 10.0  HGB 11.4* 10.3*  HCT 35.0* 31.3*  PLT 214 265   BMET Recent Labs    05/07/17 0414 05/08/17 0409  NA 132* 133*  K 3.4* 3.9  CL 101 99*  CO2 25 24  GLUCOSE 138* 232*  BUN 6 8  CREATININE 0.69 0.60*  CALCIUM 8.1* 8.2*   PT/INR Recent Labs    05/06/17 0424  LABPROT 14.1  INR 1.10   ABG No results for input(s): PHART, HCO3 in the last 72 hours.  Invalid input(s): PCO2, PO2  Studies/Results: No results found.  Anti-infectives: Anti-infectives (From admission, onward)   Start     Dose/Rate Route Frequency Ordered Stop   05/06/17 0600  cefTRIAXone (ROCEPHIN) 2 g in dextrose 5 % 50 mL IVPB  Status:  Discontinued     2 g 100 mL/hr over 30 Minutes Intravenous Every 24 hours 05/05/17 0606 05/05/17 1255   05/05/17 1400  piperacillin-tazobactam (ZOSYN) IVPB 3.375 g     3.375 g 12.5 mL/hr over 240 Minutes Intravenous Every 8 hours 05/05/17 1255     05/05/17 0615  cefTRIAXone (ROCEPHIN) 2 g in dextrose 5 % 50 mL IVPB     2 g 100 mL/hr over 30 Minutes Intravenous  Once 05/05/17 0606 05/05/17 0657   05/05/17  0600  metroNIDAZOLE (FLAGYL) IVPB 500 mg  Status:  Discontinued     500 mg 100 mL/hr over 60 Minutes Intravenous Every 8 hours 05/05/17 0506 05/05/17 1255      Assessment/Plan: s/p Procedure(s): LAPAROSCOPIC CHOLECYSTECTOMY (N/A) Adv diet as tol Mobilize con't drain Hopefully home tomorrow if con't to improve   LOS: 3 days    Marigene Ehlersamirez Jr., Jed LimerickArmando 05/08/2017

## 2017-05-09 DIAGNOSIS — Z888 Allergy status to other drugs, medicaments and biological substances status: Secondary | ICD-10-CM

## 2017-05-09 DIAGNOSIS — Z79899 Other long term (current) drug therapy: Secondary | ICD-10-CM

## 2017-05-09 DIAGNOSIS — E119 Type 2 diabetes mellitus without complications: Secondary | ICD-10-CM

## 2017-05-09 DIAGNOSIS — Z885 Allergy status to narcotic agent status: Secondary | ICD-10-CM

## 2017-05-09 DIAGNOSIS — I10 Essential (primary) hypertension: Secondary | ICD-10-CM

## 2017-05-09 DIAGNOSIS — Z978 Presence of other specified devices: Secondary | ICD-10-CM

## 2017-05-09 DIAGNOSIS — Z9049 Acquired absence of other specified parts of digestive tract: Secondary | ICD-10-CM

## 2017-05-09 DIAGNOSIS — K810 Acute cholecystitis: Secondary | ICD-10-CM

## 2017-05-09 DIAGNOSIS — Z7984 Long term (current) use of oral hypoglycemic drugs: Secondary | ICD-10-CM

## 2017-05-09 DIAGNOSIS — Z8719 Personal history of other diseases of the digestive system: Secondary | ICD-10-CM

## 2017-05-09 LAB — CBC
HEMATOCRIT: 29.6 % — AB (ref 39.0–52.0)
HEMOGLOBIN: 9.7 g/dL — AB (ref 13.0–17.0)
MCH: 28.1 pg (ref 26.0–34.0)
MCHC: 32.8 g/dL (ref 30.0–36.0)
MCV: 85.8 fL (ref 78.0–100.0)
Platelets: 293 10*3/uL (ref 150–400)
RBC: 3.45 MIL/uL — AB (ref 4.22–5.81)
RDW: 13.6 % (ref 11.5–15.5)
WBC: 8.4 10*3/uL (ref 4.0–10.5)

## 2017-05-09 LAB — GLUCOSE, CAPILLARY
GLUCOSE-CAPILLARY: 179 mg/dL — AB (ref 65–99)
Glucose-Capillary: 185 mg/dL — ABNORMAL HIGH (ref 65–99)

## 2017-05-09 MED ORDER — OXYCODONE HCL 5 MG PO TABS: ORAL_TABLET | ORAL | 0 refills | Status: DC

## 2017-05-09 MED ORDER — AMOXICILLIN-POT CLAVULANATE 875-125 MG PO TABS
1.0000 | ORAL_TABLET | Freq: Two times a day (BID) | ORAL | Status: DC
  Administered 2017-05-09: 1 via ORAL
  Filled 2017-05-09: qty 1

## 2017-05-09 MED ORDER — AMOXICILLIN-POT CLAVULANATE 875-125 MG PO TABS: 1.0000 | ORAL_TABLET | Freq: Two times a day (BID) | ORAL | 0 refills | Status: AC

## 2017-05-09 MED ORDER — SENNOSIDES-DOCUSATE SODIUM 8.6-50 MG PO TABS: 1.0000 | ORAL_TABLET | Freq: Two times a day (BID) | ORAL | 0 refills | Status: DC | PRN

## 2017-05-09 MED ORDER — POLYETHYLENE GLYCOL 3350 17 G PO PACK: 17.0000 g | PACK | Freq: Every day | ORAL | 0 refills | Status: DC | PRN

## 2017-05-09 MED FILL — oxyCODONE HCL 5 MG TABS: 5 | 5 days supply | Qty: 30 | Fill #0

## 2017-05-09 MED FILL — AMOX-CLAV 875-125 MG TABLET: 875-125 | 3 days supply | Qty: 6 | Fill #0

## 2017-05-09 NOTE — Progress Notes (Signed)
   Subjective: Patient is doing very well this AM. He has had a few bowel movement and is tolerating PO intake. His pain is well controlled but does seem with be worse with movement, coughing, or abdominal straining. We discussed that he is medically stable for discharge but will discuss follow-up with surgery and work restrictions. He agrees with the plan. All questions and concerns addressed.   Objective: Vital signs in last 24 hours: Vitals:   05/08/17 0939 05/08/17 1500 05/08/17 2021 05/09/17 0512  BP: 134/66 123/60 138/65 132/74  Pulse: 64 72 73 66  Resp: 16 16 16 16   Temp: 98.2 F (36.8 C) 97.8 F (36.6 C) 98 F (36.7 C) 98.4 F (36.9 C)  TempSrc: Oral Oral Oral   SpO2: 100% 98% 96% 96%  Weight:      Height:       General: Well nourished, well developed male  Pulm: Good air movement with no wheezing or crackles  CV: RRR, no murmurs, no rubs  Abdomen: Drain in place, with overlying bandage that is clean and dry, active bowel sounds, no tenderness to palpation  Extremities: No LE edema   Assessment/Plan:  Acute cholecystitis s/p laparoscopic cholecystectomy on 05/07/2017: Patient's pain much improved after surgery and no difficulties with pain control overnight. Patient passing gas and having bowel movements. He is tolerating PO intake. He will follow-up with general surgery next week for his drain removal. Patient encouraged to ambulate and will need to avoid strenuous activity.  - Medically stable for discharge  - Discharge with Oxy IR 5 mg 1-2 tablets every 4 hours as needed  HTN: Patient currently normotensive and hemodynamically stable - Resume home Lisinopril on discharge   T2DM: - Resume Metformin on discharge  Dispo: Anticipated discharge today.   Levora DredgeHelberg, Marcos Ruelas, MD 05/09/2017, 12:31 PM  My Pager: [phone]

## 2017-05-09 NOTE — Progress Notes (Signed)
Patient ID: Tyler FreerCharles L Doughten, male   DOB: [birth year], 72 y.o.   MRN: [id number]  [facility]  2 Days Post-Op  Subjective: CC-  Sitting up in bed, states that he is ready to go home. Denies abdominal pain. Tolerating diet. Had a BM yesterday.  WBC 8.4, afebrile.  Objective: Vital signs in last 24 hours: Temp:  [97.8 F (36.6 C)-98.4 F (36.9 C)] 98.4 F (36.9 C) (01/02 0512) Pulse Rate:  [64-73] 66 (01/02 0512) Resp:  [16] 16 (01/02 0512) BP: (123-138)/(60-74) 132/74 (01/02 0512) SpO2:  [96 %-100 %] 96 % (01/02 0512) Last BM Date: 05/08/17  Intake/Output from previous day: 01/01 0701 - 01/02 0700 In: 120 [P.O.:120] Out: 545 [Urine:525; Drains:20] Intake/Output this shift: No intake/output data recorded.  PE: Gen:  Alert, NAD, pleasant HEENT: EOM's intact, pupils equal and round Card:  RRR, no M/G/R heard Pulm:  effort normal Abd: Soft, mild distension, NT, +BS, multiple lap incisions C/D/I with steris in place, drain with minimal serosanguinous drainage Skin: no rashes noted, warm and dry  Lab Results:  Recent Labs    05/08/17 0409 05/09/17 0605  WBC 10.0 8.4  HGB 10.3* 9.7*  HCT 31.3* 29.6*  PLT 265 293   BMET Recent Labs    05/07/17 0414 05/08/17 0409  NA 132* 133*  K 3.4* 3.9  CL 101 99*  CO2 25 24  GLUCOSE 138* 232*  BUN 6 8  CREATININE 0.69 0.60*  CALCIUM 8.1* 8.2*   PT/INR No results for input(s): LABPROT, INR in the last 72 hours. CMP     Component Value Date/Time   NA 133 (L) 05/08/2017 0409   NA 139 12/31/2015 1207   K 3.9 05/08/2017 0409   CL 99 (L) 05/08/2017 0409   CO2 24 05/08/2017 0409   GLUCOSE 232 (H) 05/08/2017 0409   BUN 8 05/08/2017 0409   BUN 12 12/31/2015 1207   CREATININE 0.60 (L) 05/08/2017 0409   CREATININE 0.75 08/03/2014 1024   CALCIUM 8.2 (L) 05/08/2017 0409   PROT 6.6 05/08/2017 0409   ALBUMIN 2.7 (L) 05/08/2017 0409   AST 60 (H) 05/08/2017 0409   ALT 50 05/08/2017 0409   ALKPHOS 77  05/08/2017 0409   BILITOT 0.8 05/08/2017 0409   GFRNONAA >60 05/08/2017 0409   GFRNONAA >89 08/03/2014 1024   GFRAA >60 05/08/2017 0409   GFRAA >89 08/03/2014 1024   Lipase     Component Value Date/Time   LIPASE 35 11/13/2016 0747       Studies/Results: No results found.  Anti-infectives: Anti-infectives (From admission, onward)   Start     Dose/Rate Route Frequency Ordered Stop   05/09/17 1000  amoxicillin-clavulanate (AUGMENTIN) 875-125 MG per tablet 1 tablet     1 tablet Oral Every 12 hours 05/09/17 0828 05/12/17 0959   05/06/17 0600  cefTRIAXone (ROCEPHIN) 2 g in dextrose 5 % 50 mL IVPB  Status:  Discontinued     2 g 100 mL/hr over 30 Minutes Intravenous Every 24 hours 05/05/17 0606 05/05/17 1255   05/05/17 1400  piperacillin-tazobactam (ZOSYN) IVPB 3.375 g  Status:  Discontinued     3.375 g 12.5 mL/hr over 240 Minutes Intravenous Every 8 hours 05/05/17 1255 05/08/17 1108   05/05/17 0615  cefTRIAXone (ROCEPHIN) 2 g in dextrose 5 % 50 mL IVPB     2 g 100 mL/hr over 30 Minutes Intravenous  Once 05/05/17 0606 05/05/17 0657   05/05/17 0600  metroNIDAZOLE (FLAGYL) IVPB 500 mg  Status:  Discontinued     500 mg 100 mL/hr over 60 Minutes Intravenous Every 8 hours 05/05/17 0506 05/05/17 1255       Assessment/Plan HTN DM  Calculus of gallbladder with acute cholecystitis, without mention of obstruction; extensive intra-abdominal adhesions S/p laparoscopic LOA, cholecystectomy 12/31 Dr. Corliss Skains - POD2 - drain with 20cc/24hr SS fluid - tolerating diet, pain well controlled  ID - augmentin 1/2>>, zosyn 12/29>>1/1 FEN - carb mod diet VTE - SCDs Foley - none  Plan - Patient ready for discharge from surgical standpoint. He will need 3 days of augmentin starting today to complete 1 weeks course of antibiotics. Continue JP drain and follow up next week in clinic.   LOS: 4 days    Franne Forts , [facility] Surgery 05/09/2017, 8:28 AM Pager:  [phone] Consults: [phone] Mon-Fri 7:00 am-4:30 pm Sat-Sun 7:00 am-11:30 am

## 2017-05-09 NOTE — Progress Notes (Signed)
Pt discharged to home with his wife.  Discharge instructions given.  Prescriptions sent electronically to his pharmacy.

## 2017-05-09 NOTE — Care Management Important Message (Signed)
Important Message  Patient Details  Name: Tyler FreerCharles L Gosling MRN: [id number] Date of Birth: [birth year]   Medicare Important Message Given:  Yes    Dorena BodoIris Jaydi Bray 05/09/2017, 2:18 PM

## 2017-05-09 NOTE — Discharge Instructions (Addendum)
Please arrive at least 30 min before your appointment to complete your check in paperwork.  If you are unable to arrive 30 min prior to your appointment time we may have to cancel or reschedule you. ° °LAPAROSCOPIC SURGERY: POST OP INSTRUCTIONS  °1. DIET: Follow a light bland diet the first 24 hours after arrival home, such as soup, liquids, crackers, etc. Be sure to include lots of fluids daily. Avoid fast food or heavy meals as your are more likely to get nauseated. Eat a low fat the next few days after surgery.  °2. Take your usually prescribed home medications unless otherwise directed. °3. PAIN CONTROL:  °1. Pain is best controlled by a usual combination of three different methods TOGETHER:  °1. Ice/Heat °2. Over the counter pain medication °3. Prescription pain medication °2. Most patients will experience some swelling and bruising around the incisions. Ice packs or heating pads (30-60 minutes up to 6 times a day) will help. Use ice for the first few days to help decrease swelling and bruising, then switch to heat to help relax tight/sore spots and speed recovery. Some people prefer to use ice alone, heat alone, alternating between ice & heat. Experiment to what works for you. Swelling and bruising can take several weeks to resolve.  °3. It is helpful to take an over-the-counter pain medication regularly for the first few weeks. Choose one of the following that works best for you:  °1. Naproxen (Aleve, etc) Two 220mg tabs twice a day °2. Ibuprofen (Advil, etc) Three 200mg tabs four times a day (every meal & bedtime) °3. Acetaminophen (Tylenol, etc) 500-650mg four times a day (every meal & bedtime) °4. A prescription for pain medication (such as oxycodone, hydrocodone, etc) should be given to you upon discharge. Take your pain medication as prescribed.  °1. If you are having problems/concerns with the prescription medicine (does not control pain, nausea, vomiting, rash, itching, etc), please call us [phone]  [phone] to see if we need to switch you to a different pain medicine that will work better for you and/or control your side effect better. °2. If you need a refill on your pain medication, please contact your pharmacy. They will contact our office to request authorization. Prescriptions will not be filled after 5 pm or on week-ends. °4. Avoid getting constipated. Between the surgery and the pain medications, it is common to experience some constipation. Increasing fluid intake and taking a fiber supplement (such as Metamucil, Citrucel, FiberCon, MiraLax, etc) 1-2 times a day regularly will usually help prevent this problem from occurring. A mild laxative (prune juice, Milk of Magnesia, MiraLax, etc) should be taken according to package directions if there are no bowel movements after 48 hours.  °5. Watch out for diarrhea. If you have many loose bowel movements, simplify your diet to bland foods & liquids for a few days. Stop any stool softeners and decrease your fiber supplement. Switching to mild anti-diarrheal medications (Kayopectate, Pepto Bismol) can help. If this worsens or does not improve, please call us. °6. Wash / shower every day. You may shower over the dressings as they are waterproof. Continue to shower over incision(s) after the dressing is off. If there is glue over the incisions try not to pick it off, let it fall off naturally. °7. Remove your waterproof bandages 2 days after surgery. You may leave the incision open to air. You may replace a dressing/Band-Aid to cover the incision for comfort if you wish.  °8. ACTIVITIES as tolerated:  °  1. You may resume regular (light) daily activities beginning the next day--such as daily self-care, walking, climbing stairs--gradually increasing activities as tolerated. If you can walk 30 minutes without difficulty, it is safe to try more intense activity such as jogging, treadmill, bicycling, low-impact aerobics, swimming, etc. °2. Save the most intensive and  strenuous activity for last such as sit-ups, heavy lifting, contact sports, etc Refrain from any heavy lifting or straining until you are off narcotics for pain control. For the first 2-3 weeks do not lift over 10-15lb.  °3. DO NOT PUSH THROUGH PAIN. Let pain be your guide: If it hurts to do something, don't do it. Pain is your body warning you to avoid that activity for another week until the pain goes down. °4. You may drive when you are no longer taking prescription pain medication, you can comfortably wear a seatbelt, and you can safely maneuver your car and apply brakes. °5. You may have sexual intercourse when it is comfortable.  °9. FOLLOW UP in our office  °1. Please call [facility] at [phone] to set up an appointment to see your surgeon in the office for a follow-up appointment approximately 2-3 weeks after your surgery. °2. Make sure that you call for this appointment the day you arrive home to insure a convenient appointment time. °     10. IF YOU HAVE DISABILITY OR FAMILY LEAVE FORMS, BRING THEM TO THE               OFFICE FOR PROCESSING.  ° °WHEN TO CALL US [phone]:  °1. Poor pain control °2. Reactions / problems with new medications (rash/itching, nausea, etc)  °3. Fever over 101.5 F (38.5 C) °4. Inability to urinate °5. Nausea and/or vomiting °6. Worsening swelling or bruising °7. Continued bleeding from incision. °8. Increased pain, redness, or drainage from the incision ° °The clinic staff is available to answer your questions during regular business hours (8:30am-5pm). Please don’t hesitate to call and ask to speak to one of our nurses for clinical concerns.  °If you have a medical emergency, go to the nearest emergency room or call 911.  °A surgeon from [facility] is always on call at the hospitals  ° °[facility]  °[address], [address], [place], [place] [address] ?  °MAIN: [phone] ? TOLL FREE: [phone] ?  °FAX [phone]    °[email or web address] ° ° ° ° °S[facility] Drain [facility] °Surgical drains are used to remove extra fluid that normally builds up in a surgical wound after surgery. A surgical drain helps to heal a surgical wound. Different kinds of surgical drains include: °· Active drains. These drains use suction to pull drainage away from the surgical wound. Drainage flows through a tube to a container outside of the body. It is important to keep the bulb or the drainage container flat (compressed) at all times, except while you empty it. Flattening the bulb or container creates suction. The two most common types of active drains are bulb drains and Hemovac drains. °· Passive drains. These drains allow fluid to drain naturally, by gravity. Drainage flows through a tube to a bandage (dressing) or a container outside of the body. Passive drains do not need to be emptied. The most common type of passive drain is the Penrose drain. ° °A drain is placed during surgery. Immediately after surgery, drainage is usually bright red and a little thicker than water. The drainage may gradually turn yellow or   pink and become thinner. It is likely that your health care provider will remove the drain when the drainage stops or when the amount decreases to 1-2 Tbsp (15-30 mL) during a 24-hour period. °How to care for your surgical drain °· Keep the skin around the drain dry and covered with a dressing at all times. °· Check your drain area every day for signs of infection. Check for: °? More redness, swelling, or pain. °? Pus or a bad smell. °? Cloudy drainage. °Follow instructions from your health care provider about how to take care of your drain and how to change your dressing. Change your dressing at least one time every day. Change it more often if needed to keep the dressing dry. Make sure you: °1. Gather your supplies, including: °? Tape. °? Germ-free cleaning solution (sterile saline). °? Split gauze drain sponge: 4 x 4 inches  (10 x 10 cm). °? Gauze square: 4 x 4 inches (10 x 10 cm). °2. Wash your hands with soap and water before you change your dressing. If soap and water are not available, use hand sanitizer. °3. Remove the old dressing. Avoid using scissors to do that. °4. Use sterile saline to clean your skin around the drain. °5. Place the tube through the slit in a drain sponge. Place the drain sponge so that it covers your wound. °6. Place the gauze square or another drain sponge on top of the drain sponge that is on the wound. Make sure the tube is between those layers. °7. Tape the dressing to your skin. °8. If you have an active bulb or Hemovac drain, tape the drainage tube to your skin 1-2 inches (2.5-5 cm) below the place where the tube enters your body. Taping keeps the tube from pulling on any stitches (sutures) that you have. °9. Wash your hands with soap and water. °10. Write down the color of your drainage and how often you change your dressing. ° °How to empty your active bulb or Hemovac drain °1. Make sure that you have a measuring cup that you can empty your drainage into. °2. Wash your hands with soap and water. If soap and water are not available, use hand sanitizer. °3. Gently move your fingers down the tube while squeezing very lightly. This is called stripping the tube. This clears any drainage, clots, or tissue from the tube. °? Do not pull on the tube. °? You may need to strip the tube several times every day to keep the tube clear. °4. Open the bulb cap or the drain plug. Do not touch the inside of the cap or the bottom of the plug. °5. Empty all of the drainage into the measuring cup. °6. Compress the bulb or the container and replace the cap or the plug. To compress the bulb or the container, squeeze it firmly in the middle while you close the cap or plug the container. °7. Write down the amount of drainage that you have in each 24-hour period. If you have less than 2 Tbsp (30 mL) of drainage during 24 hours,  contact your health care provider. °8. Flush the drainage down the toilet. °9. Wash your hands with soap and water. °Contact a health care provider if: °· You have more redness, swelling, or pain around your drain area. °· The amount of drainage that you have is increasing instead of decreasing. °· You have pus or a bad smell coming from your drain area. °· You have a fever. °· You have   drainage that is cloudy. °· There is a sudden stop or a sudden decrease in the amount of drainage that you have. °· Your tube falls out. °· Your active drain does not stay compressed after you empty it. °This information is not intended to replace advice given to you by your health care provider. Make sure you discuss any questions you have with your health care provider. °Document Released: 04/21/2000 Document Revised: 09/30/2015 Document Reviewed: 11/11/2014 °Elsevier Interactive Patient Education © 2018 [employer] ° °

## 2017-05-18 MED FILL — ONE TOUCH ULTRA TEST STRIPS: 90 days supply | Qty: 100 | Fill #1

## 2017-05-18 MED FILL — METFORMIN HCL ER 500 MG TAB: 500 | 90 days supply | Qty: 270 | Fill #2

## 2017-05-18 MED FILL — LISINOPRIL 10 MG TABS: 10 | 90 days supply | Qty: 90 | Fill #3

## 2017-05-25 ENCOUNTER — Encounter: Payer: Self-pay | Admitting: Internal Medicine

## 2017-05-25 ENCOUNTER — Ambulatory Visit (INDEPENDENT_AMBULATORY_CARE_PROVIDER_SITE_OTHER): Payer: PPO | Admitting: Internal Medicine

## 2017-05-25 VITALS — BP 146/81 | HR 76 | Temp 98.7°F | Wt 203.6 lb

## 2017-05-25 DIAGNOSIS — N521 Erectile dysfunction due to diseases classified elsewhere: Secondary | ICD-10-CM | POA: Diagnosis not present

## 2017-05-25 DIAGNOSIS — E669 Obesity, unspecified: Secondary | ICD-10-CM | POA: Diagnosis not present

## 2017-05-25 DIAGNOSIS — G8929 Other chronic pain: Secondary | ICD-10-CM | POA: Diagnosis not present

## 2017-05-25 DIAGNOSIS — Z87891 Personal history of nicotine dependence: Secondary | ICD-10-CM | POA: Diagnosis not present

## 2017-05-25 DIAGNOSIS — K219 Gastro-esophageal reflux disease without esophagitis: Secondary | ICD-10-CM | POA: Diagnosis not present

## 2017-05-25 DIAGNOSIS — Z79899 Other long term (current) drug therapy: Secondary | ICD-10-CM

## 2017-05-25 DIAGNOSIS — M545 Low back pain: Secondary | ICD-10-CM

## 2017-05-25 DIAGNOSIS — R109 Unspecified abdominal pain: Secondary | ICD-10-CM

## 2017-05-25 DIAGNOSIS — G4700 Insomnia, unspecified: Secondary | ICD-10-CM

## 2017-05-25 DIAGNOSIS — I10 Essential (primary) hypertension: Secondary | ICD-10-CM | POA: Diagnosis not present

## 2017-05-25 DIAGNOSIS — R51 Headache: Secondary | ICD-10-CM | POA: Diagnosis not present

## 2017-05-25 DIAGNOSIS — M25511 Pain in right shoulder: Secondary | ICD-10-CM

## 2017-05-25 DIAGNOSIS — Z9049 Acquired absence of other specified parts of digestive tract: Secondary | ICD-10-CM

## 2017-05-25 DIAGNOSIS — E1140 Type 2 diabetes mellitus with diabetic neuropathy, unspecified: Secondary | ICD-10-CM | POA: Diagnosis not present

## 2017-05-25 DIAGNOSIS — Z6830 Body mass index (BMI) 30.0-30.9, adult: Secondary | ICD-10-CM

## 2017-05-25 NOTE — Assessment & Plan Note (Signed)
Assessment  His weight today is 203 pounds. Of note, upon admission in late December his weight had dropped to 200 pounds. He had been working diligently on losing his weight preoperatively.  Plan  Although the weight is up 3 pounds since surgery this is not necessarily a bad thing at this time as he is recovering. We will therefore reassess his weight at the follow-up visit.

## 2017-05-25 NOTE — Assessment & Plan Note (Signed)
Assessment  Ironically his chronic abdominal pain has improved postoperatively. It is possible that his chronic abdominal pain may have been related to cholelithiasis although his pain was not in the right upper quadrant previously. It is also quite likely that his chronic abdominal pain may been related to scarring and with lysis of adhesions that occurred during the laparoscopic surgery this improved the problem. He definitely notices much less bloating postoperatively.  Plan  It sounds like the lysis of adhesions may have improved his abdominal pain. We will reassess the persistence of this improvement at the follow-up visit.

## 2017-05-25 NOTE — Assessment & Plan Note (Signed)
Assessment  Today the chronic back pain was more pronounced in the right shoulder blade area. Examination revealed no defects to visualization or palpation. There was no muscle spasms on exam.  Plan  His symptoms seem to resolve with a muscle relaxant as well as an analgesic patch. He was encouraged to continue with this therapy over the next couple of days in hopes of resulting in complete resolution. We will reassess if there is continuance of the right shoulder blade area pain at the follow-up visit.

## 2017-05-25 NOTE — Patient Instructions (Signed)
It was nice to see you again.  I am happy for you and your wife regarding the new home.  1) Keep taking the medications as you are.  Remember, the lisinopril is 20 mg daily.  2) I checked a complete blood count today to see if it can explain your fatigue.  I will see you back in 3 months, sooner if necessary.

## 2017-05-25 NOTE — Assessment & Plan Note (Signed)
He is currently up-to-date on his health care maintenance. 

## 2017-05-25 NOTE — Assessment & Plan Note (Signed)
Assessment  He continues to take the pantoprazole 40 mg by mouth twice daily. He notes an improvement in his gastroesophageal reflux disease symptoms since the surgery. Presumably the lysis of adhesions may have had something to do with this improved symptomatology.  Plan  We will continue the pantoprazole at 40 mg by mouth twice daily and reassess the efficacy of this therapy at the follow-up visit. If he continues to do well postoperatively we will consider lowering the dose of the pantoprazole to 40 mg by mouth daily and reassessing for symptoms.

## 2017-05-25 NOTE — Progress Notes (Signed)
   Subjective:    Patient ID: Tyler Bridges, male    DOB: May 16, 1945, 72 y.o.   MRN: [id number]  HPI  Tyler Bridges is here for post-hospital follow-up after undergoing a laparoscopic cholecystectomy for acute cholecystitis in late December, essential hypertension, gastroesophageal reflux disease, chronic back pain, chronic abdominal pain, and obesity. Please see the A&P for the status of the pt's chronic medical problems.  He has slowly been getting better postoperatively since discharge but has had pain in the right shoulder blade area, and occasional sharp right temporal headache, and continued difficulties with insomnia. Last night he tried a combination of a analgesic patch and Flexeril with relief of his right posterior shoulder pain and thus the ability to get some sleep. He has not had a headache since he was able to sleep.  Also of note was a impressive Christmas gift given by his son-in-law. He and his wife were given a 3 bedroom house which they will be moving in mid February.  Review of Systems  Constitutional: Positive for fatigue. Negative for appetite change, fever and unexpected weight change.  Respiratory: Negative for cough, chest tightness, shortness of breath and wheezing.   Cardiovascular: Negative for chest pain, palpitations and leg swelling.  Gastrointestinal: Positive for abdominal pain. Negative for constipation, diarrhea, nausea and vomiting.       Abdominal pain and chronic bloating are improved post-operatively (? because of lysis of adhesions).  Genitourinary: Negative for difficulty urinating.  Skin: Negative for rash and wound.  Neurological: Positive for headaches.  Psychiatric/Behavioral: Positive for sleep disturbance.      Objective:   Physical Exam  Constitutional: He is oriented to person, place, and time. He appears well-developed and well-nourished. No distress.  HENT:  Head: Normocephalic and atraumatic.  Eyes: Conjunctivae are normal. Right eye  exhibits no discharge. Left eye exhibits no discharge. No scleral icterus.  Abdominal: Soft. Bowel sounds are normal. He exhibits no distension. There is no tenderness. There is no rebound and no guarding.  Laparoscopic port scars are healing well without erythema or drainange  Musculoskeletal: Normal range of motion. He exhibits no edema, tenderness or deformity.  Neurological: He is alert and oriented to person, place, and time. He exhibits normal muscle tone.  Skin: Skin is warm and dry. No rash noted. He is not diaphoretic. No erythema.  Psychiatric: He has a normal mood and affect. His behavior is normal. Judgment and thought content normal.  Nursing note and vitals reviewed.     Assessment & Plan:   Please see problem based charting.

## 2017-05-25 NOTE — Assessment & Plan Note (Signed)
Assessment  His blood pressure today was elevated at 146/81. It was discovered that even after rewriting the lisinopril prescription to 20 mg by mouth daily the [facility] pharmacy once again dispensed 10 mg daily. I am unclear as to why this continues to occur despite the prescription reading 20 mg by mouth daily.  Plan  I spoke with our clinic pharmacist Dr. Selena BattenKim who will try to contact the pharmacy to make sure they are aware that the lisinopril prescription is for 20 mg by mouth daily and that this should be the prescription dose that is dispensed at the next refill. He was instructed to take two 10 mg tablets daily. We are hopeful that once on the appropriate lisinopril dose the hypertension will resolve.

## 2017-05-26 LAB — CBC
HEMATOCRIT: 38.1 % (ref 37.5–51.0)
Hemoglobin: 12.1 g/dL — ABNORMAL LOW (ref 13.0–17.7)
MCH: 27.1 pg (ref 26.6–33.0)
MCHC: 31.8 g/dL (ref 31.5–35.7)
MCV: 85 fL (ref 79–97)
PLATELETS: 330 10*3/uL (ref 150–379)
RBC: 4.47 x10E6/uL (ref 4.14–5.80)
RDW: 13.8 % (ref 12.3–15.4)
WBC: 11.2 10*3/uL — AB (ref 3.4–10.8)

## 2017-05-28 NOTE — Progress Notes (Signed)
Patient ID: Tyler FreerCharles L Bridges, male   DOB: 28-Dec-1945, 72 y.o.   MRN: [id number]  CBC: Hct 38.1, WBC 11.2  Recovered from post-op anemia, slight increase in WBC count likely related to recent viral infection.  I called Tyler Bridges with the results.

## 2017-06-18 MED FILL — ETODOLAC 400 MG TABLET: 400 | 30 days supply | Qty: 60 | Fill #1

## 2017-06-29 DIAGNOSIS — H11013 Amyloid pterygium of eye, bilateral: Secondary | ICD-10-CM | POA: Diagnosis not present

## 2017-06-29 DIAGNOSIS — E119 Type 2 diabetes mellitus without complications: Secondary | ICD-10-CM | POA: Diagnosis not present

## 2017-06-29 DIAGNOSIS — Z961 Presence of intraocular lens: Secondary | ICD-10-CM | POA: Diagnosis not present

## 2017-06-29 LAB — HM DIABETES EYE EXAM

## 2017-07-13 ENCOUNTER — Encounter: Payer: Self-pay | Admitting: *Deleted

## 2017-07-19 ENCOUNTER — Other Ambulatory Visit: Payer: Self-pay | Admitting: Internal Medicine

## 2017-07-19 DIAGNOSIS — I10 Essential (primary) hypertension: Secondary | ICD-10-CM

## 2017-07-19 MED FILL — PANTOPRAZOLE SOD DR 40 MG T: 40 | 90 days supply | Qty: 180 | Fill #1

## 2017-07-19 MED FILL — LISINOPRIL 20 MG TABLET: 20 | 90 days supply | Qty: 90 | Fill #0

## 2017-08-16 MED FILL — METFORMIN HCL ER 500 MG TAB: 500 | 90 days supply | Qty: 270 | Fill #3

## 2017-09-04 MED FILL — ETODOLAC 400 MG TABLET: 400 | 30 days supply | Qty: 60 | Fill #2

## 2017-10-16 MED FILL — PANTOPRAZOLE SOD DR 40 MG T: 40 | 90 days supply | Qty: 180 | Fill #2

## 2017-11-20 ENCOUNTER — Other Ambulatory Visit: Payer: Self-pay | Admitting: Internal Medicine

## 2017-11-20 DIAGNOSIS — E1140 Type 2 diabetes mellitus with diabetic neuropathy, unspecified: Principal | ICD-10-CM

## 2017-11-20 DIAGNOSIS — N521 Erectile dysfunction due to diseases classified elsewhere: Secondary | ICD-10-CM

## 2017-11-20 MED FILL — LISINOPRIL 20 MG TABLET: 20 | 90 days supply | Qty: 90 | Fill #1

## 2017-11-20 MED FILL — METFORMIN HCL ER 500 MG TAB: 500 | 90 days supply | Qty: 270 | Fill #0

## 2017-12-19 MED FILL — HYDROCODON-APAP 5-325: 5-325 | 4 days supply | Qty: 20 | Fill #0

## 2018-01-24 MED FILL — ETODOLAC 400 MG TABLET: 400 | 30 days supply | Qty: 60 | Fill #3

## 2018-01-28 ENCOUNTER — Encounter (INDEPENDENT_AMBULATORY_CARE_PROVIDER_SITE_OTHER): Payer: Self-pay | Admitting: Orthopaedic Surgery

## 2018-01-28 ENCOUNTER — Ambulatory Visit (INDEPENDENT_AMBULATORY_CARE_PROVIDER_SITE_OTHER): Payer: PPO

## 2018-01-28 ENCOUNTER — Ambulatory Visit (INDEPENDENT_AMBULATORY_CARE_PROVIDER_SITE_OTHER): Payer: PPO | Admitting: Orthopaedic Surgery

## 2018-01-28 VITALS — BP 132/78 | HR 74 | Ht 68.0 in | Wt 195.0 lb

## 2018-01-28 DIAGNOSIS — G8929 Other chronic pain: Secondary | ICD-10-CM | POA: Diagnosis not present

## 2018-01-28 DIAGNOSIS — M25562 Pain in left knee: Secondary | ICD-10-CM

## 2018-01-28 MED ORDER — LIDOCAINE HCL 1 % IJ SOLN
2.0000 mL | INTRAMUSCULAR | Status: AC | PRN
  Administered 2018-01-28: 2 mL

## 2018-01-28 MED ORDER — METHYLPREDNISOLONE ACETATE 40 MG/ML IJ SUSP
80.0000 mg | INTRAMUSCULAR | Status: AC | PRN
  Administered 2018-01-28: 80 mg

## 2018-01-28 MED ORDER — HYDROCODONE-ACETAMINOPHEN 5-325 MG PO TABS: 1.0000 | ORAL_TABLET | Freq: Four times a day (QID) | ORAL | 0 refills | Status: DC | PRN

## 2018-01-28 MED ORDER — BUPIVACAINE HCL 0.5 % IJ SOLN
2.0000 mL | INTRAMUSCULAR | Status: AC | PRN
  Administered 2018-01-28: 2 mL via INTRA_ARTICULAR

## 2018-01-28 MED FILL — HYDROCODON-APAP 5-325: 5-325 | 7 days supply | Qty: 30 | Fill #0
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
## 2018-01-29 ENCOUNTER — Ambulatory Visit (INDEPENDENT_AMBULATORY_CARE_PROVIDER_SITE_OTHER): Payer: PPO | Admitting: Orthopaedic Surgery

## 2018-01-31 ENCOUNTER — Encounter: Payer: Self-pay | Admitting: Internal Medicine

## 2018-01-31 DIAGNOSIS — M1120 Other chondrocalcinosis, unspecified site: Secondary | ICD-10-CM

## 2018-01-31 HISTORY — DX: Other chondrocalcinosis, unspecified site: M11.20

## 2018-02-18 ENCOUNTER — Encounter (INDEPENDENT_AMBULATORY_CARE_PROVIDER_SITE_OTHER): Payer: Self-pay | Admitting: Orthopaedic Surgery

## 2018-02-18 ENCOUNTER — Ambulatory Visit (INDEPENDENT_AMBULATORY_CARE_PROVIDER_SITE_OTHER): Payer: PPO | Admitting: Orthopaedic Surgery

## 2018-02-18 VITALS — BP 141/67 | HR 72 | Resp 16 | Ht 68.0 in | Wt 200.0 lb

## 2018-02-18 DIAGNOSIS — M25511 Pain in right shoulder: Secondary | ICD-10-CM | POA: Diagnosis not present

## 2018-02-18 DIAGNOSIS — G8929 Other chronic pain: Secondary | ICD-10-CM | POA: Diagnosis not present

## 2018-02-18 MED ORDER — LIDOCAINE HCL 1 % IJ SOLN
2.0000 mL | INTRAMUSCULAR | Status: AC | PRN
  Administered 2018-02-18: 2 mL

## 2018-02-18 MED ORDER — METHYLPREDNISOLONE ACETATE 40 MG/ML IJ SUSP
80.0000 mg | INTRAMUSCULAR | Status: AC | PRN
  Administered 2018-02-18: 80 mg

## 2018-02-18 MED ORDER — BUPIVACAINE HCL 0.5 % IJ SOLN
2.0000 mL | INTRAMUSCULAR | Status: AC | PRN
  Administered 2018-02-18: 2 mL via INTRA_ARTICULAR
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
## 2018-02-21 MED FILL — PANTOPRAZOLE SOD DR 40 MG T: 40 | 90 days supply | Qty: 180 | Fill #3

## 2018-02-21 MED FILL — LISINOPRIL 20 MG TABLET: 20 | 90 days supply | Qty: 90 | Fill #2

## 2018-02-21 MED FILL — metFORMIN HCL ER 500 MG TB2: 500 | 90 days supply | Qty: 270 | Fill #1

## 2018-03-22 ENCOUNTER — Ambulatory Visit (INDEPENDENT_AMBULATORY_CARE_PROVIDER_SITE_OTHER): Payer: PPO | Admitting: Internal Medicine

## 2018-03-22 ENCOUNTER — Other Ambulatory Visit: Payer: Self-pay

## 2018-03-22 ENCOUNTER — Encounter: Payer: Self-pay | Admitting: Internal Medicine

## 2018-03-22 VITALS — BP 133/63 | HR 62 | Wt 206.6 lb

## 2018-03-22 DIAGNOSIS — R269 Unspecified abnormalities of gait and mobility: Secondary | ICD-10-CM

## 2018-03-22 DIAGNOSIS — M1120 Other chondrocalcinosis, unspecified site: Secondary | ICD-10-CM

## 2018-03-22 DIAGNOSIS — Z7982 Long term (current) use of aspirin: Secondary | ICD-10-CM | POA: Diagnosis not present

## 2018-03-22 DIAGNOSIS — T466X5D Adverse effect of antihyperlipidemic and antiarteriosclerotic drugs, subsequent encounter: Secondary | ICD-10-CM | POA: Diagnosis not present

## 2018-03-22 DIAGNOSIS — I700 Atherosclerosis of aorta: Secondary | ICD-10-CM | POA: Diagnosis not present

## 2018-03-22 DIAGNOSIS — M11262 Other chondrocalcinosis, left knee: Secondary | ICD-10-CM

## 2018-03-22 DIAGNOSIS — K219 Gastro-esophageal reflux disease without esophagitis: Secondary | ICD-10-CM | POA: Diagnosis not present

## 2018-03-22 DIAGNOSIS — I10 Essential (primary) hypertension: Secondary | ICD-10-CM | POA: Diagnosis not present

## 2018-03-22 DIAGNOSIS — G720 Drug-induced myopathy: Secondary | ICD-10-CM | POA: Diagnosis not present

## 2018-03-22 DIAGNOSIS — E1140 Type 2 diabetes mellitus with diabetic neuropathy, unspecified: Secondary | ICD-10-CM

## 2018-03-22 DIAGNOSIS — Z79891 Long term (current) use of opiate analgesic: Secondary | ICD-10-CM

## 2018-03-22 DIAGNOSIS — N521 Erectile dysfunction due to diseases classified elsewhere: Secondary | ICD-10-CM

## 2018-03-22 DIAGNOSIS — Z23 Encounter for immunization: Secondary | ICD-10-CM | POA: Diagnosis not present

## 2018-03-22 DIAGNOSIS — Z7984 Long term (current) use of oral hypoglycemic drugs: Secondary | ICD-10-CM

## 2018-03-22 DIAGNOSIS — T466X5A Adverse effect of antihyperlipidemic and antiarteriosclerotic drugs, initial encounter: Secondary | ICD-10-CM | POA: Insufficient documentation

## 2018-03-22 DIAGNOSIS — Z79899 Other long term (current) drug therapy: Secondary | ICD-10-CM

## 2018-03-22 LAB — POCT GLYCOSYLATED HEMOGLOBIN (HGB A1C): HEMOGLOBIN A1C: 7.1 % — AB (ref 4.0–5.6)

## 2018-03-22 LAB — GLUCOSE, CAPILLARY: GLUCOSE-CAPILLARY: 136 mg/dL — AB (ref 70–99)

## 2018-03-22 MED ORDER — TRAMADOL HCL 50 MG PO TABS: 100.0000 mg | ORAL_TABLET | Freq: Three times a day (TID) | ORAL | 5 refills | Status: DC | PRN

## 2018-03-22 MED FILL — traMADol HCL 50 MG TABS: 50 | 25 days supply | Qty: 150 | Fill #0

## 2018-03-22 NOTE — Assessment & Plan Note (Signed)
Assessment  His blood pressure is nearly at target today at 133/63.  This is on lisinopril 20 mg by mouth daily.  He is tolerating this medication well.  Plan  We will continue the lisinopril at 20 mg by mouth daily and reassess the efficacy of this therapy at keeping his blood pressure below 130/80 at the follow-up visit.

## 2018-03-22 NOTE — Progress Notes (Signed)
   Subjective:    Patient ID: Tyler Bridges, male    DOB: 04/15/1946, 72 y.o.   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 Please see the A&P for the status of the pt's chronic medical problems.  He has no acute complaints today.  Review of Systems  Constitutional: Negative for activity change, appetite change, fatigue and unexpected weight change.  Cardiovascular: Negative for leg swelling.  Gastrointestinal: Positive for abdominal pain. Negative for constipation, diarrhea, nausea and vomiting.  Musculoskeletal: Positive for arthralgias, back pain and gait problem. Negative for joint swelling and myalgias.  Skin: Negative for rash and wound.  Psychiatric/Behavioral: Negative for dysphoric mood and sleep disturbance. The patient is not nervous/anxious.       Objective:   Physical Exam  Constitutional: He is oriented to person, place, and time. He appears well-developed and well-nourished. No distress.  HENT:  Head: Normocephalic and atraumatic.  Eyes: Conjunctivae are normal. Right eye exhibits no discharge. Left eye exhibits no discharge. No scleral icterus.  Cardiovascular: Normal rate, regular rhythm and normal heart sounds. Exam reveals no gallop and no friction rub.  No murmur heard. Pulmonary/Chest: Effort normal and breath sounds normal. No stridor. No respiratory distress. He has no wheezes. He has no rales.  Abdominal: Soft. Bowel sounds are normal. He exhibits no distension. There is no tenderness. There is no rebound and no guarding.  Musculoskeletal: Normal range of motion. He exhibits no edema, tenderness or deformity.  Neurological: He is alert and oriented to person, place, and time. He exhibits normal muscle tone.  Skin: Skin is  warm and dry. No rash noted. He is not diaphoretic. No erythema.  Psychiatric: He has a normal mood and affect. His behavior is normal. Judgment and thought content normal.  Nursing note and vitals reviewed.     Assessment & Plan:   Please see problem oriented charting.

## 2018-03-22 NOTE — Assessment & Plan Note (Signed)
Assessment  As he is diabetic he would benefit from statin therapy.  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  He understands the benefit of statin therapy in diabetics.  Plan  We will again discuss options for statin therapy that may be less likely to cause myopathy at the follow-up visit to see if he may change his mind and give the hydrophilic statins a chance.

## 2018-03-22 NOTE — Assessment & Plan Note (Signed)
Assessment  He denies any symptoms of claudication suggestive of symptomatic aortic atherosclerosis.  We are aggressively managing his cardiovascular risk factors including his diabetes and hypertension.  Unfortunately, he is not interested in statin therapy at this time.  Plan  We will continue to manage his diabetes with the metformin, his hypertension with lisinopril, and his other cardiovascular risk factors with aspirin 81 mg by mouth daily.  We will reassess if he has developed symptoms of claudication at the follow-up visit.

## 2018-03-22 NOTE — Assessment & Plan Note (Signed)
Assessment  His diabetes is well controlled today with a hemoglobin A1c of 7.1.  This is on metformin XR 1500 mg daily.  With regards to his erectile dysfunction, he does not consider this to be an active issue.  Plan  We will continue the Metformin XR 1500 mg by mouth daily.  We also are continuing the lisinopril 20 mg by mouth daily for renal protection.  We will reassess the efficacy of this regimen and managing glycemic control and protecting his kidneys at the follow-up visit.

## 2018-03-22 NOTE — Assessment & Plan Note (Signed)
He received a flu vaccination today.  Otherwise he is up-to-date on his healthcare maintenance. 

## 2018-03-22 NOTE — Patient Instructions (Signed)
It was great to see you again.  You are doing great with your diabetes and hypertension.  1) Keep taking the medications as you are.  2) I started tramadol 100 mg by mouth every 8 hours as needed for pain.  3) Keep avoiding sweets as you have been doing.  4) Keep active.  I will see you back in 6 months, sooner if necessary.

## 2018-03-22 NOTE — Assessment & Plan Note (Signed)
Assessment  The ibuprofen and etodolac were ineffective in controlling his knee pain.  Although he may be a candidate for colchicine we decided to try tramadol 100 mg by mouth every 8 hours as needed to assess its efficacy at controlling his knee pain.  Plan  Tramadol 100 mg by mouth every 8 hours as needed for knee pain dispense number 150/month was prescribed.  We will assess the efficacy of this at controlling his knee pain at the follow-up visit.

## 2018-03-22 NOTE — Assessment & Plan Note (Signed)
Assessment  His gastroesophageal reflux symptoms are well controlled on the pantoprazole 40 mg by mouth twice daily.  I had meant to decrease this to once daily to assess if there was a recurrence of his symptoms on daily dosing but had forgotten to mention this to him during the visit.  Plan  We will continue the pantoprazole at 40 mg by mouth twice daily and at the follow-up visit propose that we drop it to once daily to assess for a return of the gastroesophageal reflux symptoms.  I am hopeful that we will be able to successfully manage his symptoms on once daily pantoprazole in the future.

## 2018-04-11 DIAGNOSIS — M316 Other giant cell arteritis: Secondary | ICD-10-CM | POA: Diagnosis not present

## 2018-04-11 DIAGNOSIS — Z961 Presence of intraocular lens: Secondary | ICD-10-CM | POA: Diagnosis not present

## 2018-04-11 DIAGNOSIS — H20011 Primary iridocyclitis, right eye: Secondary | ICD-10-CM | POA: Diagnosis not present

## 2018-04-11 DIAGNOSIS — E119 Type 2 diabetes mellitus without complications: Secondary | ICD-10-CM | POA: Diagnosis not present

## 2018-04-11 DIAGNOSIS — H11013 Amyloid pterygium of eye, bilateral: Secondary | ICD-10-CM | POA: Diagnosis not present

## 2018-05-24 ENCOUNTER — Other Ambulatory Visit: Payer: Self-pay | Admitting: *Deleted

## 2018-05-24 ENCOUNTER — Other Ambulatory Visit: Payer: Self-pay | Admitting: Internal Medicine

## 2018-05-24 DIAGNOSIS — I10 Essential (primary) hypertension: Secondary | ICD-10-CM

## 2018-05-24 DIAGNOSIS — K219 Gastro-esophageal reflux disease without esophagitis: Secondary | ICD-10-CM

## 2018-05-24 MED ORDER — LISINOPRIL 20 MG PO TABS: 20.0000 mg | ORAL_TABLET | Freq: Every day | ORAL | 3 refills | Status: DC

## 2018-05-24 MED ORDER — PANTOPRAZOLE SODIUM 40 MG PO TBEC: 40.0000 mg | DELAYED_RELEASE_TABLET | Freq: Two times a day (BID) | ORAL | 3 refills | Status: DC

## 2018-05-24 MED FILL — metFORMIN HCL ER 500 MG TB2: 500 | 90 days supply | Qty: 270 | Fill #2

## 2018-05-24 MED FILL — PANTOPRAZOLE SOD DR 40 MG T: 40 | 90 days supply | Qty: 180 | Fill #0

## 2018-05-24 MED FILL — LISINOPRIL 20 MG TABLET: 20 | 90 days supply | Qty: 90 | Fill #0

## 2018-05-28 ENCOUNTER — Other Ambulatory Visit: Payer: Self-pay | Admitting: Internal Medicine

## 2018-05-28 ENCOUNTER — Other Ambulatory Visit: Payer: Self-pay | Admitting: *Deleted

## 2018-05-28 DIAGNOSIS — G8929 Other chronic pain: Secondary | ICD-10-CM

## 2018-05-28 DIAGNOSIS — M545 Low back pain: Principal | ICD-10-CM

## 2018-05-28 MED FILL — ETODOLAC 400 MG TABLET: 400 | 90 days supply | Qty: 180 | Fill #0

## 2018-05-28 NOTE — Telephone Encounter (Signed)
Duplicate Request: Received electronic request from pharmacy..  Will close this encounter and send refill request with message below.Tyler Spittle Cassady1/21/20203:56 PM

## 2018-05-28 NOTE — Telephone Encounter (Signed)
Call from patient and patient's wife requesting to go back on etodolac for his knee pain.  Pt unable to tolerate tramadol due to the "severe headaches" it causes.  Etodolac is no longer listed as a current medication, will send to pcp for review.  Please advise.Tyler Bridges, [name]1/21/202010:31 AM

## 2018-05-28 NOTE — Telephone Encounter (Signed)
   05/28/18 10:35 AM  Note    Call from patient and patient's wife requesting to go back on etodolac for his knee pain.  Pt unable to tolerate tramadol due to the "severe headaches" it causes.  Etodolac is no longer listed as a current medication, will send to pcp for review.  Please advise.Criss AlvineGoldston, [name]1/21/202010:31 AM

## 2018-08-01 IMAGING — MR MR LUMBAR SPINE W/O CM
4 of 5 series · 23 of 48 positions shown · non-contrast
Comparison: MRI lumbar spine 04/17/2013.

CLINICAL DATA: Chronic left side low back pain radiating into the
left leg. Symptoms for 5 years and worsening. No known injury.

EXAM:
MRI LUMBAR SPINE WITHOUT CONTRAST
TECHNIQUE: Multiplanar, multisequence MR imaging of the lumbar spine was
performed. No intravenous contrast was administered.

[Series 2: T1 · sagittal · 4.0mm · 0.51mm/px · 5 of 15 slices shown (1 of 2)]
[im 1/15]
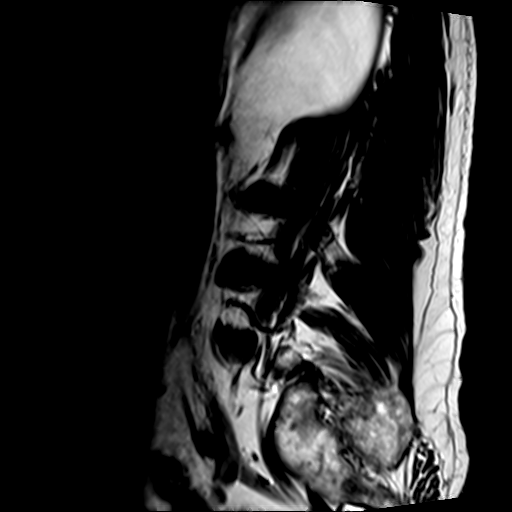
[im 3/15]
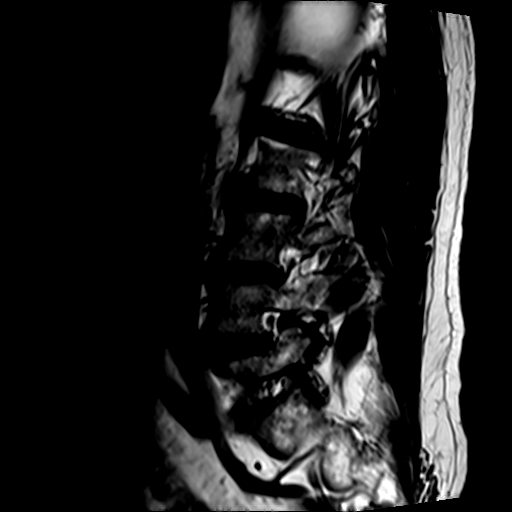
[im 6/15]
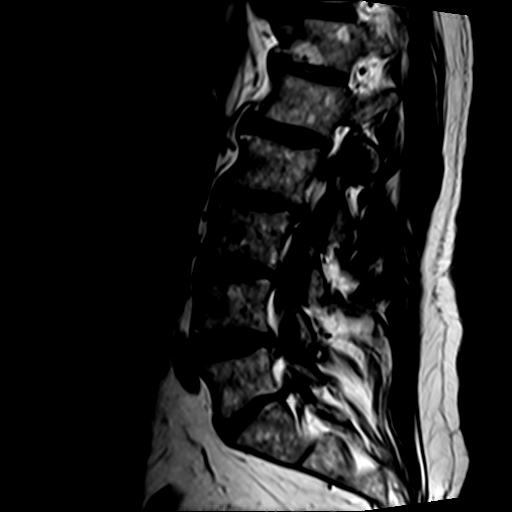
[im 9/15]
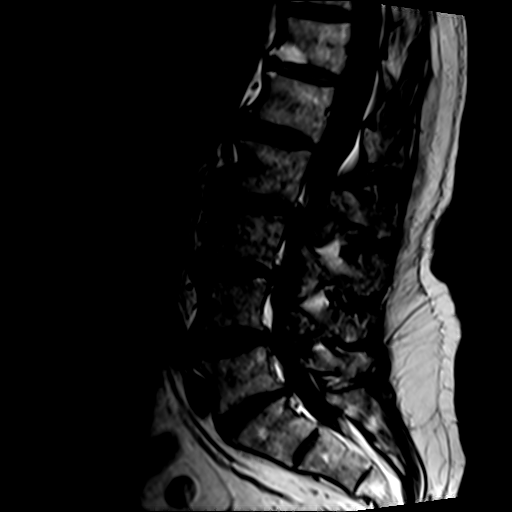
[im 15/15]
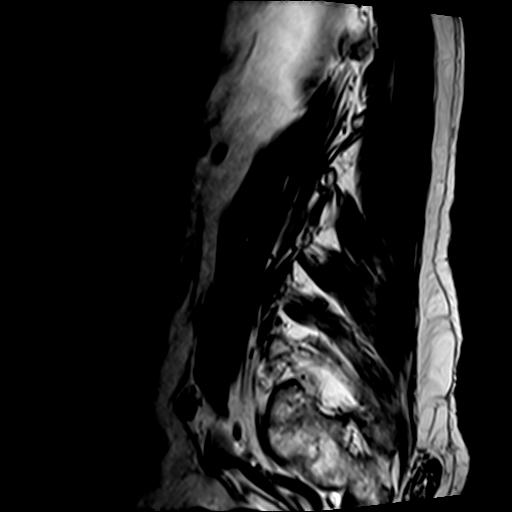

[Series 3: T2 · sagittal · 4.0mm · 0.81mm/px · 5 of 15 slices shown (1 of 2)]
[im 1/15]
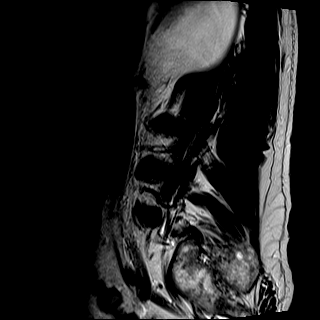
[im 4/15]
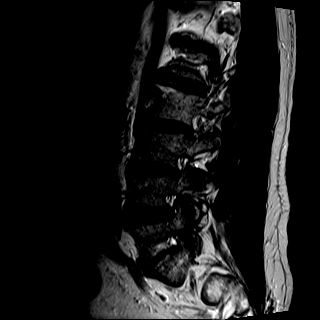
[im 8/15]
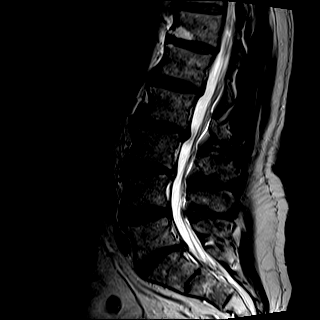
[im 11/15]
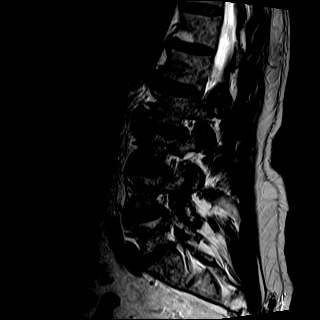
[im 15/15]
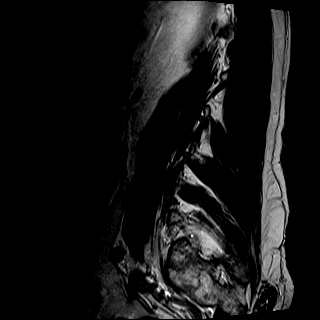

[Series 7: T1 · axial · 4.0mm · 0.78mm/px · z∈[-88,+103]mm · 3 of 47 slices shown (2 of 2)]
[im 7/47]
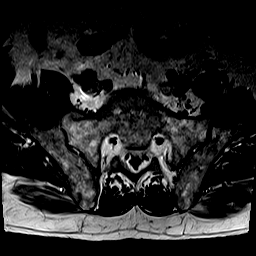
[im 25/47]
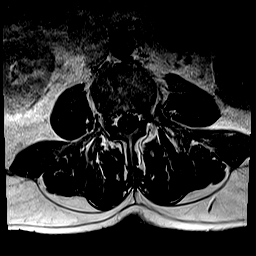
[im 40/47]
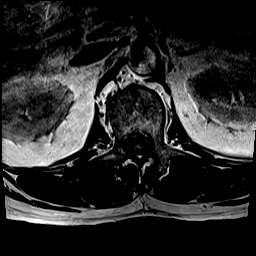

[Series 8: T2 · axial · 4.0mm · 0.39mm/px · z∈[-103,+138]mm · 10 of 47 slices shown (2 of 2)]
[im 4/47]
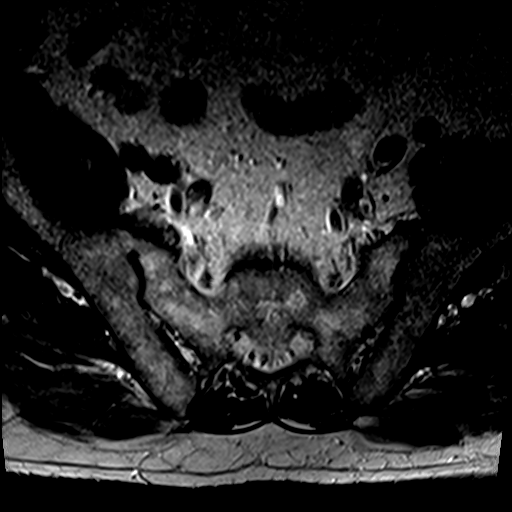
[im 7/47]
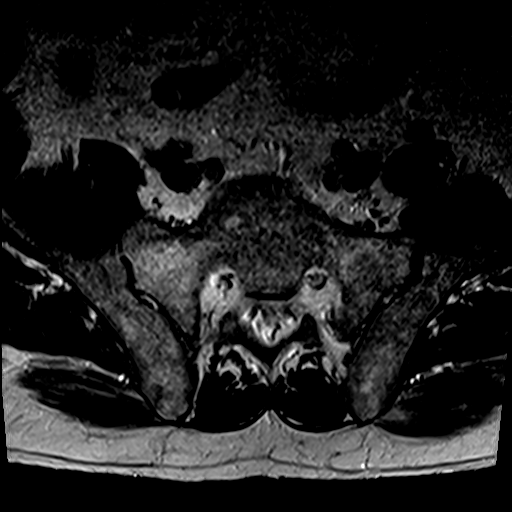
[im 10/47]
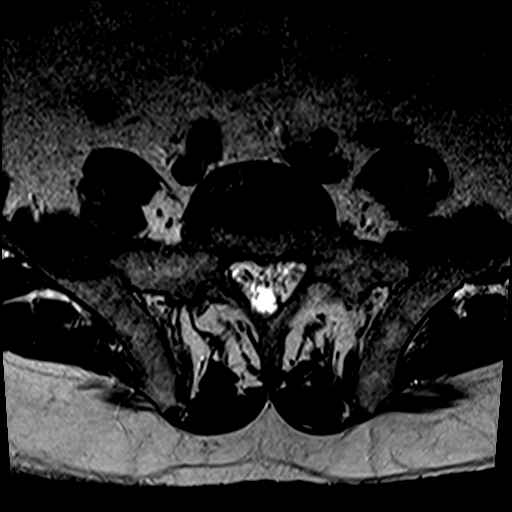
[im 16/47]
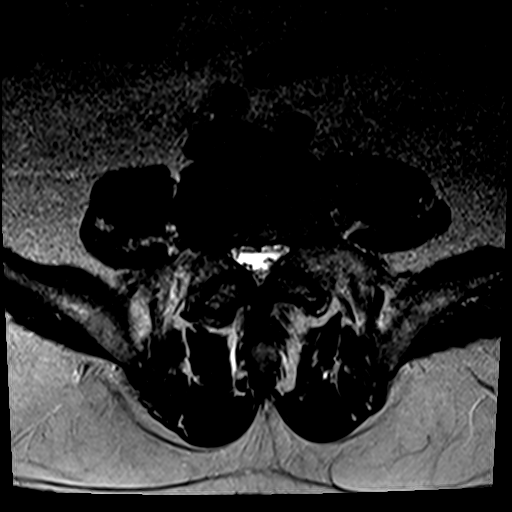
[im 22/47]
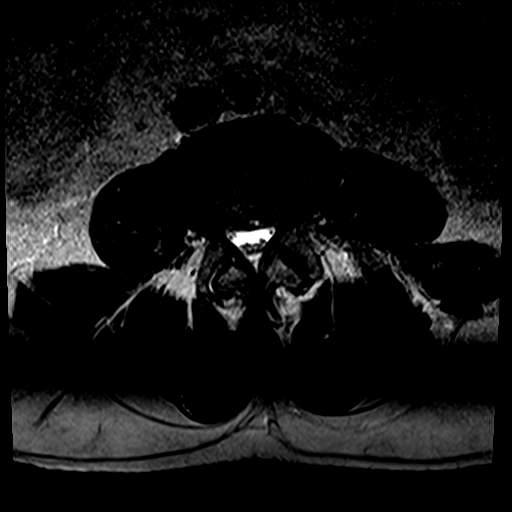
[im 25/47]
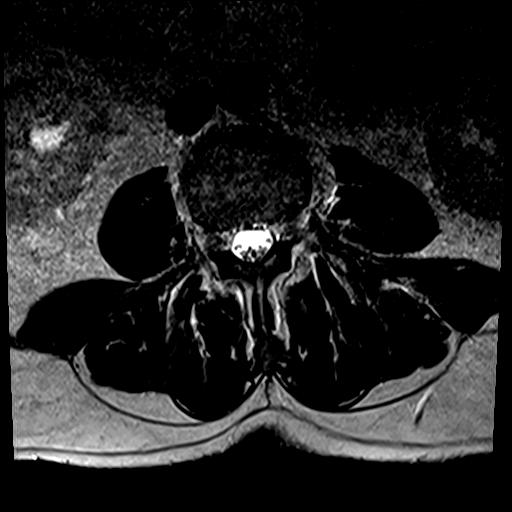
[im 28/47]
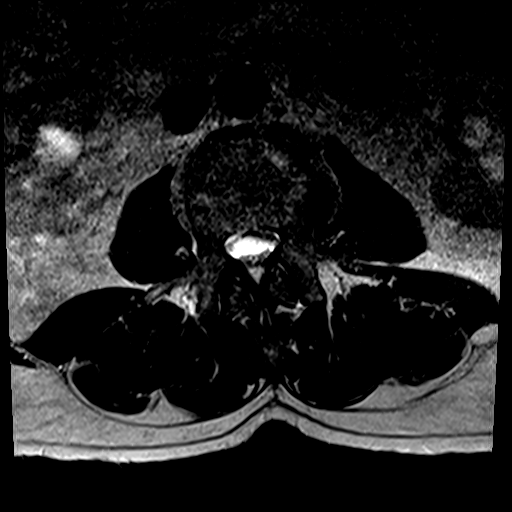
[im 34/47]
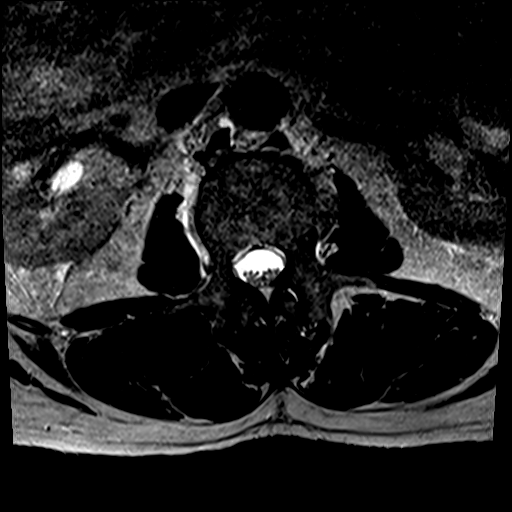
[im 40/47]
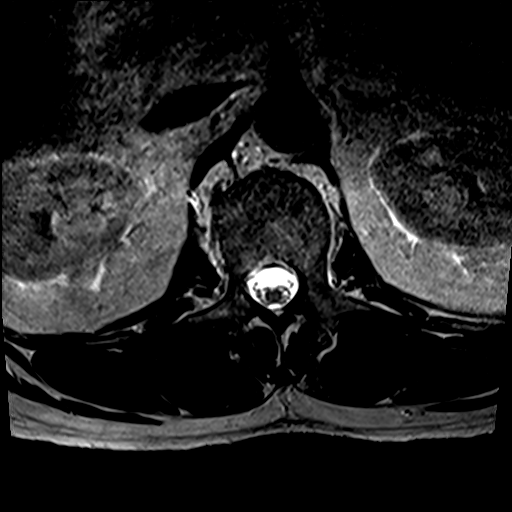
[im 47/47]
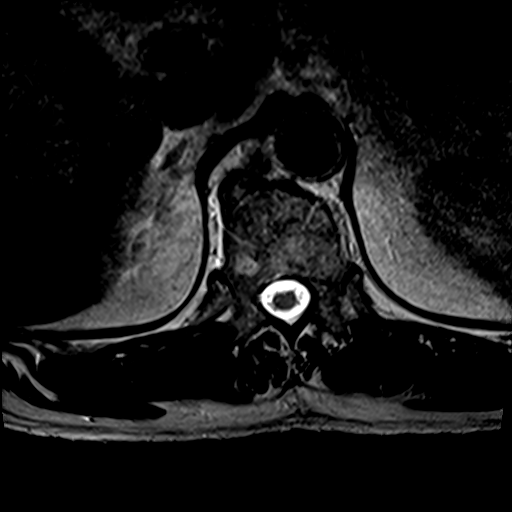

[23 of 48 positions shown; findings below may reference images not displayed]

FINDINGS: Segmentation:  Unremarkable.

Alignment: No fracture or worrisome marrow lesion. Convex right
scoliosis is noted.

Vertebrae: Degenerative endplate signal change T12-L1 is seen
anteriorly as on the prior study.

Conus medullaris: Extends to the L1-2 level and appears normal.

Paraspinal and other soft tissues: Unremarkable.

Disc levels:

T11-12 is imaged in sagittal plane only and negative.

T12-L1:  Negative.

L1-2: Minimal disc bulge without central canal or foraminal
narrowing. Right paravertebral protrusion seen on the prior
examination is no longer visualized.

L2-3: Shallow broad-based disc bulge is slightly more prominent than
on the prior study but the central canal remains open. The foramina
are patent. Extra foraminal disc on the far left may contact the
exited left L2 root, unchanged.

L3-4: Shallow disc bulge causing minimal bilateral lateral recess
narrowing is unchanged. The foramina are open.

L4-5: Ligamentum flavum thickening and a shallow disc bulge are
again seen. Moderate facet arthropathy is worse on the left. Mild
central canal and left lateral recess narrowing is unchanged. The
foramina are open.

L5-S1: Shallow broad-based central protrusion without central canal
or foraminal narrowing is unchanged. Mild right foraminal narrowing
is again seen.
IMPRESSION: Shallow broad-based disc bulge at L2-3 is slightly more prominent
than on the prior examination but the central canal remains open. No
other change is identified.

Minimal bilateral lateral recess narrowing L3-4 due to a shallow
disc bulge.

Mild central canal and left lateral recess narrowing L4-5 where
there is moderate facet degenerative change.

Mild right foraminal narrowing L5-S1.

## 2018-08-04 ENCOUNTER — Encounter: Payer: Self-pay | Admitting: *Deleted

## 2018-09-03 MED FILL — LISINOPRIL 20 MG TABLET: 20 | 90 days supply | Qty: 90 | Fill #0

## 2018-09-03 MED FILL — metFORMIN HCL ER 500 MG TB2: 500 | 90 days supply | Qty: 270 | Fill #0

## 2018-09-03 MED FILL — PANTOPRAZOLE SOD DR 40 MG T: 40 | 90 days supply | Qty: 180 | Fill #0

## 2018-09-27 ENCOUNTER — Ambulatory Visit: Payer: PPO | Admitting: Internal Medicine

## 2018-11-27 ENCOUNTER — Other Ambulatory Visit: Payer: Self-pay | Admitting: *Deleted

## 2018-11-27 DIAGNOSIS — E1140 Type 2 diabetes mellitus with diabetic neuropathy, unspecified: Secondary | ICD-10-CM

## 2018-11-27 DIAGNOSIS — N521 Erectile dysfunction due to diseases classified elsewhere: Secondary | ICD-10-CM

## 2018-11-27 MED ORDER — METFORMIN HCL ER 500 MG PO TB24
1500.0000 mg | ORAL_TABLET | Freq: Every day | ORAL | 3 refills | Status: DC
Start: 2018-11-27 — End: 2019-11-26

## 2018-11-27 MED FILL — PANTOPRAZOLE SOD DR 40 MG T: 40 | 90 days supply | Qty: 180 | Fill #1

## 2018-11-27 MED FILL — metFORMIN HCL ER 500 MG TB2: 500 | 90 days supply | Qty: 270 | Fill #0

## 2018-11-27 MED FILL — LISINOPRIL 20 MG TABLET: 20 | 90 days supply | Qty: 90 | Fill #1

## 2018-12-06 MED FILL — metFORMIN HCL ER 500 MG TB2: 500 | 90 days supply | Qty: 270 | Fill #0

## 2019-01-07 ENCOUNTER — Telehealth: Payer: Self-pay | Admitting: *Deleted

## 2019-01-07 NOTE — Telephone Encounter (Signed)
Pt states he would like a refill of tramadol, he states he just finished his last refill of 03/2018

## 2019-01-08 MED ORDER — TRAMADOL HCL 50 MG PO TABS: 100.0000 mg | ORAL_TABLET | Freq: Three times a day (TID) | ORAL | 0 refills | Status: DC | PRN

## 2019-01-08 MED FILL — traMADol HCL 50 MG TABS: 50 | 7 days supply | Qty: 42 | Fill #0

## 2019-01-08 NOTE — Telephone Encounter (Signed)
Ok. Tramadol refilled. Can you please arrange for him to have a follow up appointment with me when convenient please?

## 2019-02-06 ENCOUNTER — Telehealth: Payer: Self-pay | Admitting: *Deleted

## 2019-02-06 NOTE — Telephone Encounter (Signed)
Needs PA for tramadol

## 2019-02-07 ENCOUNTER — Telehealth: Payer: Self-pay | Admitting: *Deleted

## 2019-02-07 NOTE — Telephone Encounter (Addendum)
Information was sent to CoverMyMeds.  Stated no PA is needed for Tramadol.  Call to [facility]. Patient picked up only 7 pills on 02/04/2019. Patients insurance information was gotten and PA was attempted through Call to [employer] at [phone]. Policy # [id number] [employer] [id number]. after message from CoverMyMeds that PA was not needed.  Patient unable to get paid refill until 02/10/2019.  Call to patient message left on Answering Machine to call the Clinics about his medication.   Sander Nephew, RN 02/07/2019 9:54 AM.

## 2019-02-10 NOTE — Telephone Encounter (Signed)
Getting to early.

## 2019-02-17 ENCOUNTER — Ambulatory Visit: Payer: PPO | Admitting: Student in an Organized Health Care Education/Training Program

## 2019-03-07 MED FILL — METFORMIN HCL ER 500 MG TB2: 500 | 90 days supply | Qty: 270 | Fill #1

## 2019-03-07 MED FILL — LISINOPRIL 20 MG TABLET: 20 | 90 days supply | Qty: 90 | Fill #0

## 2019-05-12 MED FILL — traMADol HCL 50 MG TABS: 50 | 7 days supply | Qty: 42 | Fill #1

## 2019-06-02 ENCOUNTER — Other Ambulatory Visit: Payer: Self-pay | Admitting: *Deleted

## 2019-06-02 DIAGNOSIS — G8929 Other chronic pain: Secondary | ICD-10-CM

## 2019-06-02 DIAGNOSIS — K2100 Gastro-esophageal reflux disease with esophagitis, without bleeding: Secondary | ICD-10-CM

## 2019-06-02 DIAGNOSIS — I10 Essential (primary) hypertension: Secondary | ICD-10-CM

## 2019-06-02 MED ORDER — PANTOPRAZOLE SODIUM 40 MG PO TBEC: 40.0000 mg | DELAYED_RELEASE_TABLET | Freq: Two times a day (BID) | ORAL | 3 refills | Status: DC

## 2019-06-02 MED ORDER — LISINOPRIL 20 MG PO TABS: 20.0000 mg | ORAL_TABLET | Freq: Every day | ORAL | 3 refills | Status: DC

## 2019-06-02 MED FILL — METFORMIN HCL ER 500 MG TB2: 500 | 90 days supply | Qty: 270 | Fill #2

## 2019-06-02 MED FILL — LISINOPRIL 20 MG TABLET: 20 | 90 days supply | Qty: 90 | Fill #0

## 2019-06-02 NOTE — Telephone Encounter (Signed)
Needs an appointment for labs, has been 2 years. Thanks.

## 2019-06-04 MED ORDER — ETODOLAC 400 MG PO TABS: 400.0000 mg | ORAL_TABLET | Freq: Two times a day (BID) | ORAL | 0 refills | Status: DC | PRN

## 2019-06-04 MED FILL — ETODOLAC 400 MG TABS: 400 | 90 days supply | Qty: 180 | Fill #0

## 2019-06-04 NOTE — Telephone Encounter (Signed)
Called patient N/a.  Left a message for the patient to contact our office to schedule an appointment as soon as possible.

## 2019-06-09 ENCOUNTER — Encounter: Payer: Self-pay | Admitting: Student in an Organized Health Care Education/Training Program

## 2019-07-01 MED FILL — traMADol HCL 50 MG TABS: 50 | 11 days supply | Qty: 66 | Fill #2

## 2019-07-16 MED FILL — PANTOPRAZOLE SOD DR 40 MG T: 40 | 90 days supply | Qty: 180 | Fill #0

## 2019-07-28 DIAGNOSIS — L309 Dermatitis, unspecified: Secondary | ICD-10-CM | POA: Diagnosis not present

## 2019-07-28 DIAGNOSIS — Z23 Encounter for immunization: Secondary | ICD-10-CM | POA: Diagnosis not present

## 2019-07-28 MED FILL — TRIAMCINOLONE ACETONIDE 0.1: 0.1 | 30 days supply | Qty: 454 | Fill #0

## 2019-08-21 ENCOUNTER — Other Ambulatory Visit: Payer: Self-pay | Admitting: *Deleted

## 2019-08-21 MED ORDER — TRAMADOL HCL 50 MG PO TABS: 100.0000 mg | ORAL_TABLET | Freq: Three times a day (TID) | ORAL | 0 refills | Status: DC | PRN

## 2019-08-21 MED FILL — traMADol HCL 50 MG TABS: 50 | 25 days supply | Qty: 150 | Fill #0

## 2019-08-25 MED FILL — METFORMIN HCL ER 500 MG TB2: 500 | 90 days supply | Qty: 270 | Fill #3

## 2019-08-25 MED FILL — LISINOPRIL 20 MG TABLET: 20 | 90 days supply | Qty: 90 | Fill #1

## 2019-11-13 MED FILL — LISINOPRIL 20 MG TABLET: 20 | 90 days supply | Qty: 90 | Fill #2

## 2019-11-13 MED FILL — PANTOPRAZOLE SOD DR 40 MG T: 40 | 90 days supply | Qty: 180 | Fill #1

## 2019-11-25 ENCOUNTER — Other Ambulatory Visit: Payer: Self-pay | Admitting: *Deleted

## 2019-11-25 MED ORDER — TRAMADOL HCL 50 MG PO TABS: 100.0000 mg | ORAL_TABLET | Freq: Three times a day (TID) | ORAL | 0 refills | Status: DC | PRN

## 2019-11-25 MED FILL — traMADol HCL 50 MG TABS: 50 | 5 days supply | Qty: 30 | Fill #0

## 2019-11-25 NOTE — Telephone Encounter (Signed)
Patient has not been seen since 2019 and has not yet seen his new PCP. He does not appear to have a pain contract and has not followed up with his new PCP. I will give him 30 pills but he needs to see DR. Vincent ASAP.

## 2019-11-26 ENCOUNTER — Other Ambulatory Visit: Payer: Self-pay | Admitting: *Deleted

## 2019-11-26 DIAGNOSIS — E1140 Type 2 diabetes mellitus with diabetic neuropathy, unspecified: Secondary | ICD-10-CM

## 2019-11-27 ENCOUNTER — Other Ambulatory Visit: Payer: Self-pay | Admitting: *Deleted

## 2019-11-27 ENCOUNTER — Other Ambulatory Visit: Payer: Self-pay | Admitting: Internal Medicine

## 2019-11-27 MED ORDER — TRAMADOL HCL 50 MG PO TABS: 100.0000 mg | ORAL_TABLET | Freq: Three times a day (TID) | ORAL | 0 refills | Status: DC | PRN

## 2019-11-27 MED ORDER — METFORMIN HCL ER 500 MG PO TB24: 1500.0000 mg | ORAL_TABLET | Freq: Every day | ORAL | 3 refills | Status: DC

## 2019-11-27 MED FILL — METFORMIN HCL ER 500 MG TB2: 500 | 90 days supply | Qty: 270 | Fill #0

## 2019-11-27 NOTE — Telephone Encounter (Signed)
I only did 30 so he would follow up in 1 month.

## 2019-11-27 NOTE — Telephone Encounter (Signed)
Per dr Heide Spark, appt 8/23 w/ dr Oswaldo Done

## 2019-11-27 NOTE — Telephone Encounter (Signed)
His wife, Gavin Pound, will make sure he f/u as needed

## 2019-12-01 ENCOUNTER — Encounter (HOSPITAL_BASED_OUTPATIENT_CLINIC_OR_DEPARTMENT_OTHER): Payer: Self-pay | Admitting: *Deleted

## 2019-12-01 ENCOUNTER — Emergency Department (HOSPITAL_BASED_OUTPATIENT_CLINIC_OR_DEPARTMENT_OTHER)
Admission: EM | Admit: 2019-12-01 | Discharge: 2019-12-01 | Disposition: A | Discharge status: Home / Self Care | Payer: PPO | Attending: Emergency Medicine | Admitting: Emergency Medicine

## 2019-12-01 ENCOUNTER — Other Ambulatory Visit: Payer: Self-pay

## 2019-12-01 ENCOUNTER — Emergency Department (HOSPITAL_BASED_OUTPATIENT_CLINIC_OR_DEPARTMENT_OTHER): Payer: PPO

## 2019-12-01 DIAGNOSIS — R351 Nocturia: Secondary | ICD-10-CM | POA: Insufficient documentation

## 2019-12-01 DIAGNOSIS — E669 Obesity, unspecified: Secondary | ICD-10-CM | POA: Diagnosis not present

## 2019-12-01 DIAGNOSIS — N401 Enlarged prostate with lower urinary tract symptoms: Secondary | ICD-10-CM | POA: Diagnosis not present

## 2019-12-01 DIAGNOSIS — K56609 Unspecified intestinal obstruction, unspecified as to partial versus complete obstruction: Secondary | ICD-10-CM | POA: Diagnosis not present

## 2019-12-01 DIAGNOSIS — Z6830 Body mass index (BMI) 30.0-30.9, adult: Secondary | ICD-10-CM | POA: Insufficient documentation

## 2019-12-01 DIAGNOSIS — Z87891 Personal history of nicotine dependence: Secondary | ICD-10-CM | POA: Insufficient documentation

## 2019-12-01 DIAGNOSIS — Z7984 Long term (current) use of oral hypoglycemic drugs: Secondary | ICD-10-CM | POA: Diagnosis not present

## 2019-12-01 DIAGNOSIS — R1031 Right lower quadrant pain: Secondary | ICD-10-CM | POA: Diagnosis present

## 2019-12-01 DIAGNOSIS — I10 Essential (primary) hypertension: Secondary | ICD-10-CM | POA: Diagnosis not present

## 2019-12-01 DIAGNOSIS — E1169 Type 2 diabetes mellitus with other specified complication: Secondary | ICD-10-CM | POA: Diagnosis not present

## 2019-12-01 DIAGNOSIS — R682 Dry mouth, unspecified: Secondary | ICD-10-CM | POA: Diagnosis not present

## 2019-12-01 DIAGNOSIS — Z7982 Long term (current) use of aspirin: Secondary | ICD-10-CM | POA: Diagnosis not present

## 2019-12-01 DIAGNOSIS — I700 Atherosclerosis of aorta: Secondary | ICD-10-CM | POA: Diagnosis not present

## 2019-12-01 DIAGNOSIS — N132 Hydronephrosis with renal and ureteral calculous obstruction: Secondary | ICD-10-CM | POA: Diagnosis not present

## 2019-12-01 DIAGNOSIS — K219 Gastro-esophageal reflux disease without esophagitis: Secondary | ICD-10-CM | POA: Insufficient documentation

## 2019-12-01 DIAGNOSIS — I708 Atherosclerosis of other arteries: Secondary | ICD-10-CM | POA: Diagnosis not present

## 2019-12-01 DIAGNOSIS — N049 Nephrotic syndrome with unspecified morphologic changes: Secondary | ICD-10-CM | POA: Diagnosis not present

## 2019-12-01 DIAGNOSIS — N201 Calculus of ureter: Secondary | ICD-10-CM | POA: Insufficient documentation

## 2019-12-01 LAB — COMPREHENSIVE METABOLIC PANEL
ALT: 26 U/L (ref 0–44)
AST: 26 U/L (ref 15–41)
Albumin: 3.9 g/dL (ref 3.5–5.0)
Alkaline Phosphatase: 67 U/L (ref 38–126)
Anion gap: 12 (ref 5–15)
BUN: 15 mg/dL (ref 8–23)
CO2: 24 mmol/L (ref 22–32)
Calcium: 8.5 mg/dL — ABNORMAL LOW (ref 8.9–10.3)
Chloride: 101 mmol/L (ref 98–111)
Creatinine, Ser: 1.06 mg/dL (ref 0.61–1.24)
GFR calc Af Amer: >60 mL/min (ref >60)
GFR calc non Af Amer: >60 mL/min (ref >60)
Glucose, Bld: 242 mg/dL — ABNORMAL HIGH (ref 70–99)
Potassium: 4.3 mmol/L (ref 3.5–5.1)
Sodium: 137 mmol/L (ref 135–145)
Total Bilirubin: 0.7 mg/dL (ref 0.3–1.2)
Total Protein: 7.4 g/dL (ref 6.5–8.1)

## 2019-12-01 LAB — CBC WITH DIFFERENTIAL/PLATELET
Abs Immature Granulocytes: 0.04 10*3/uL (ref 0.00–0.07)
Basophils Absolute: 0 10*3/uL (ref 0.0–0.1)
Basophils Relative: 0 %
Eosinophils Absolute: 0.1 10*3/uL (ref 0.0–0.5)
Eosinophils Relative: 0 %
HCT: 40.4 % (ref 39.0–52.0)
Hemoglobin: 13.4 g/dL (ref 13.0–17.0)
Immature Granulocytes: 0 %
Lymphocytes Relative: 17 %
Lymphs Abs: 2.2 10*3/uL (ref 0.7–4.0)
MCH: 28.1 pg (ref 26.0–34.0)
MCHC: 33.2 g/dL (ref 30.0–36.0)
MCV: 84.7 fL (ref 80.0–100.0)
Monocytes Absolute: 1.4 10*3/uL — ABNORMAL HIGH (ref 0.1–1.0)
Monocytes Relative: 11 %
Neutro Abs: 9.2 10*3/uL — ABNORMAL HIGH (ref 1.7–7.7)
Neutrophils Relative %: 72 %
Platelets: 260 10*3/uL (ref 150–400)
RBC: 4.77 MIL/uL (ref 4.22–5.81)
RDW: 13.4 % (ref 11.5–15.5)
WBC: 12.9 10*3/uL — ABNORMAL HIGH (ref 4.0–10.5)
nRBC: 0 % (ref 0.0–0.2)

## 2019-12-01 LAB — URINALYSIS, ROUTINE W REFLEX MICROSCOPIC
Glucose, UA: >=500 mg/dL — AB
Ketones, ur: NEGATIVE mg/dL
Leukocytes,Ua: NEGATIVE
Nitrite: NEGATIVE
Protein, ur: NEGATIVE mg/dL
Specific Gravity, Urine: 1.02 (ref 1.005–1.030)
pH: 5.5 (ref 5.0–8.0)

## 2019-12-01 LAB — URINALYSIS, MICROSCOPIC (REFLEX)
RBC / HPF: >50 RBC/hpf (ref 0–5)
WBC, UA: NONE SEEN WBC/hpf (ref 0–5)

## 2019-12-01 LAB — LIPASE, BLOOD: Lipase: 20 U/L (ref 11–51)

## 2019-12-01 MED ORDER — MORPHINE SULFATE (PF) 4 MG/ML IV SOLN
4.0000 mg | Freq: Once | INTRAVENOUS | Status: AC
  Administered 2019-12-01: 4 mg via INTRAVENOUS
  Filled 2019-12-01: qty 1

## 2019-12-01 MED ORDER — HYDROCODONE-ACETAMINOPHEN 5-325 MG PO TABS: 2.0000 | ORAL_TABLET | ORAL | 0 refills | Status: DC | PRN

## 2019-12-01 MED ORDER — ONDANSETRON 4 MG PO TBDP
4.0000 mg | ORAL_TABLET | Freq: Three times a day (TID) | ORAL | 0 refills | Status: DC | PRN
Start: 2019-12-01 — End: 2021-02-21

## 2019-12-01 MED ORDER — KETOROLAC TROMETHAMINE 30 MG/ML IJ SOLN
15.0000 mg | Freq: Once | INTRAMUSCULAR | Status: AC
  Administered 2019-12-01: 15 mg via INTRAVENOUS
  Filled 2019-12-01: qty 1

## 2019-12-01 MED ORDER — HYDROCODONE-ACETAMINOPHEN 5-325 MG PO TABS
2.0000 | ORAL_TABLET | Freq: Once | ORAL | Status: AC
  Administered 2019-12-01: 2 via ORAL
  Filled 2019-12-01: qty 2

## 2019-12-01 MED ORDER — TAMSULOSIN HCL 0.4 MG PO CAPS: 0.4000 mg | ORAL_CAPSULE | Freq: Every day | ORAL | 0 refills | Status: DC

## 2019-12-01 MED ORDER — LACTATED RINGERS IV BOLUS
1000.0000 mL | Freq: Once | INTRAVENOUS | Status: AC
  Administered 2019-12-01: 1000 mL via INTRAVENOUS

## 2019-12-01 MED FILL — HYDROCODON-APAP 5-325: 5-325 | 2 days supply | Qty: 15 | Fill #0

## 2019-12-01 MED FILL — TAMSULOSIN HCL 0.4 MG CAP: 0.4 | 7 days supply | Qty: 7 | Fill #0

## 2019-12-01 NOTE — ED Triage Notes (Signed)
abd pain, back pain and constipation onset Saturday  Bilateral back pain w rt side radiating into groin,   Last bm Friday pm
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
## 2019-12-02 DIAGNOSIS — N132 Hydronephrosis with renal and ureteral calculous obstruction: Secondary | ICD-10-CM | POA: Diagnosis not present

## 2019-12-02 DIAGNOSIS — N23 Unspecified renal colic: Secondary | ICD-10-CM | POA: Diagnosis not present

## 2019-12-02 DIAGNOSIS — N201 Calculus of ureter: Secondary | ICD-10-CM | POA: Diagnosis not present

## 2019-12-02 MED FILL — traMADol HCL 50 MG TABS: 50 | 20 days supply | Qty: 120 | Fill #0

## 2019-12-03 MED FILL — ONDANSETRON ODT 4 MG TABLET: 4 | 2 days supply | Qty: 8 | Fill #0

## 2019-12-04 DIAGNOSIS — N201 Calculus of ureter: Secondary | ICD-10-CM | POA: Diagnosis not present

## 2019-12-04 DIAGNOSIS — N132 Hydronephrosis with renal and ureteral calculous obstruction: Secondary | ICD-10-CM | POA: Diagnosis not present

## 2019-12-04 MED FILL — OXYCODONE-APAP 5-325MG: 5-325 | 4 days supply | Qty: 20 | Fill #0

## 2019-12-05 ENCOUNTER — Other Ambulatory Visit (HOSPITAL_COMMUNITY)
Admission: RE | Admit: 2019-12-05 | Discharge: 2019-12-05 | Disposition: A | Discharge status: Home / Self Care | Payer: PPO | Source: Ambulatory Visit | Attending: Urology | Admitting: Urology

## 2019-12-05 ENCOUNTER — Encounter (HOSPITAL_COMMUNITY): Payer: Self-pay | Admitting: Urology

## 2019-12-05 ENCOUNTER — Other Ambulatory Visit: Payer: Self-pay | Admitting: Urology

## 2019-12-05 ENCOUNTER — Other Ambulatory Visit: Payer: Self-pay

## 2019-12-05 DIAGNOSIS — Z01812 Encounter for preprocedural laboratory examination: Secondary | ICD-10-CM | POA: Insufficient documentation

## 2019-12-05 DIAGNOSIS — Z20822 Contact with and (suspected) exposure to covid-19: Secondary | ICD-10-CM | POA: Diagnosis not present

## 2019-12-05 LAB — SARS CORONAVIRUS 2 (TAT 6-24 HRS): SARS Coronavirus 2: NEGATIVE

## 2019-12-05 NOTE — Progress Notes (Signed)
COVID Vaccine Completed: No Date COVID Vaccine completed: N/A COVID vaccine manufacturer: N/A   PCP - Dr. Jearld Adjutant Cardiologist - Dr. Katrinka Blazing hasn't been seen in years  Chest x-ray - greater than 1 year EKG - greater than 1 year Stress Test - greater than 2years ECHO - N/A Cardiac Cath - greater than 2 years  Sleep Study - N/A CPAP - N/A  Fasting Blood Sugar - 150-160's Checks Blood Sugar __2-3___ times a week  Blood Thinner Instructions: N/A Aspirin Instructions: Yes Last Dose: 12/03/2019  Anesthesia review: N/A  Patient denies shortness of breath, fever, cough and chest pain at PAT appointment   Patient verbalized understanding of instructions that were given to them at the PAT appointment. Patient was also instructed that they will need to review over the PAT instructions again at home before surgery.

## 2019-12-08 ENCOUNTER — Encounter (HOSPITAL_COMMUNITY)
Admission: RE | Disposition: A | Discharge status: Home / Self Care | Payer: Self-pay | Source: Home / Self Care | Attending: Urology

## 2019-12-08 ENCOUNTER — Ambulatory Visit (HOSPITAL_COMMUNITY): Payer: PPO | Admitting: Anesthesiology

## 2019-12-08 ENCOUNTER — Other Ambulatory Visit: Payer: Self-pay

## 2019-12-08 ENCOUNTER — Ambulatory Visit (HOSPITAL_COMMUNITY): Payer: PPO

## 2019-12-08 ENCOUNTER — Encounter (HOSPITAL_COMMUNITY): Payer: Self-pay | Admitting: Urology

## 2019-12-08 ENCOUNTER — Ambulatory Visit (HOSPITAL_COMMUNITY)
Admission: RE | Admit: 2019-12-08 | Discharge: 2019-12-08 | Disposition: A | Discharge status: Home / Self Care | Payer: PPO | Attending: Urology | Admitting: Urology

## 2019-12-08 DIAGNOSIS — N201 Calculus of ureter: Secondary | ICD-10-CM | POA: Diagnosis not present

## 2019-12-08 DIAGNOSIS — Z7984 Long term (current) use of oral hypoglycemic drugs: Secondary | ICD-10-CM | POA: Diagnosis not present

## 2019-12-08 DIAGNOSIS — Z87891 Personal history of nicotine dependence: Secondary | ICD-10-CM | POA: Diagnosis not present

## 2019-12-08 DIAGNOSIS — E785 Hyperlipidemia, unspecified: Secondary | ICD-10-CM | POA: Insufficient documentation

## 2019-12-08 DIAGNOSIS — I10 Essential (primary) hypertension: Secondary | ICD-10-CM | POA: Diagnosis not present

## 2019-12-08 DIAGNOSIS — K219 Gastro-esophageal reflux disease without esophagitis: Secondary | ICD-10-CM | POA: Insufficient documentation

## 2019-12-08 DIAGNOSIS — E119 Type 2 diabetes mellitus without complications: Secondary | ICD-10-CM | POA: Insufficient documentation

## 2019-12-08 DIAGNOSIS — N401 Enlarged prostate with lower urinary tract symptoms: Secondary | ICD-10-CM | POA: Diagnosis not present

## 2019-12-08 DIAGNOSIS — Z79899 Other long term (current) drug therapy: Secondary | ICD-10-CM | POA: Insufficient documentation

## 2019-12-08 DIAGNOSIS — K279 Peptic ulcer, site unspecified, unspecified as acute or chronic, without hemorrhage or perforation: Secondary | ICD-10-CM | POA: Diagnosis not present

## 2019-12-08 DIAGNOSIS — N132 Hydronephrosis with renal and ureteral calculous obstruction: Secondary | ICD-10-CM | POA: Diagnosis not present

## 2019-12-08 DIAGNOSIS — Z823 Family history of stroke: Secondary | ICD-10-CM | POA: Insufficient documentation

## 2019-12-08 DIAGNOSIS — M1990 Unspecified osteoarthritis, unspecified site: Secondary | ICD-10-CM | POA: Insufficient documentation

## 2019-12-08 DIAGNOSIS — Z8249 Family history of ischemic heart disease and other diseases of the circulatory system: Secondary | ICD-10-CM | POA: Diagnosis not present

## 2019-12-08 DIAGNOSIS — R351 Nocturia: Secondary | ICD-10-CM | POA: Diagnosis not present

## 2019-12-08 HISTORY — DX: Personal history of urinary calculi: Z87.442

## 2019-12-08 HISTORY — PX: CYSTOSCOPY/URETEROSCOPY/HOLMIUM LASER/STENT PLACEMENT: SHX6546

## 2019-12-08 LAB — HEMOGLOBIN A1C
Hgb A1c MFr Bld: 7.7 % — ABNORMAL HIGH (ref 4.8–5.6)
Mean Plasma Glucose: 174.29 mg/dL

## 2019-12-08 LAB — GLUCOSE, CAPILLARY: Glucose-Capillary: 187 mg/dL — ABNORMAL HIGH (ref 70–99)

## 2019-12-08 SURGERY — CYSTOSCOPY/URETEROSCOPY/HOLMIUM LASER/STENT PLACEMENT
Anesthesia: General | Laterality: Right

## 2019-12-08 MED ORDER — FENTANYL CITRATE (PF) 100 MCG/2ML IJ SOLN
INTRAMUSCULAR | Status: AC
  Filled 2019-12-08: qty 2

## 2019-12-08 MED ORDER — EPHEDRINE SULFATE-NACL 50-0.9 MG/10ML-% IV SOSY
PREFILLED_SYRINGE | INTRAVENOUS | Status: DC | PRN
  Administered 2019-12-08: 10 mg via INTRAVENOUS

## 2019-12-08 MED ORDER — FENTANYL CITRATE (PF) 100 MCG/2ML IJ SOLN
INTRAMUSCULAR | Status: DC | PRN
  Administered 2019-12-08 (×2): 50 ug via INTRAVENOUS

## 2019-12-08 MED ORDER — ORAL CARE MOUTH RINSE: 15.0000 mL | Freq: Once | OROMUCOSAL | Status: AC

## 2019-12-08 MED ORDER — PROPOFOL 10 MG/ML IV BOLUS
INTRAVENOUS | Status: DC | PRN
  Administered 2019-12-08: 150 mg via INTRAVENOUS

## 2019-12-08 MED ORDER — IOHEXOL 300 MG/ML  SOLN
INTRAMUSCULAR | Status: DC | PRN
  Administered 2019-12-08: 10 mL

## 2019-12-08 MED ORDER — SODIUM CHLORIDE 0.9 % IR SOLN
Status: DC | PRN
  Administered 2019-12-08: 3000 mL

## 2019-12-08 MED ORDER — 0.9 % SODIUM CHLORIDE (POUR BTL) OPTIME
TOPICAL | Status: DC | PRN
  Administered 2019-12-08: 1000 mL

## 2019-12-08 MED ORDER — FENTANYL CITRATE (PF) 100 MCG/2ML IJ SOLN: 25.0000 ug | INTRAMUSCULAR | Status: DC | PRN

## 2019-12-08 MED ORDER — ACETAMINOPHEN 500 MG PO TABS
1000.0000 mg | ORAL_TABLET | Freq: Once | ORAL | Status: AC
  Administered 2019-12-08: 1000 mg via ORAL
  Filled 2019-12-08: qty 2

## 2019-12-08 MED ORDER — DEXAMETHASONE SODIUM PHOSPHATE 10 MG/ML IJ SOLN
INTRAMUSCULAR | Status: DC | PRN
  Administered 2019-12-08: 4 mg via INTRAVENOUS

## 2019-12-08 MED ORDER — CHLORHEXIDINE GLUCONATE 0.12 % MT SOLN
15.0000 mL | Freq: Once | OROMUCOSAL | Status: AC
  Administered 2019-12-08: 15 mL via OROMUCOSAL

## 2019-12-08 MED ORDER — PHENYLEPHRINE HCL-NACL 10-0.9 MG/250ML-% IV SOLN
INTRAVENOUS | Status: DC | PRN
  Administered 2019-12-08: 25 ug/min via INTRAVENOUS

## 2019-12-08 MED ORDER — LACTATED RINGERS IV SOLN: INTRAVENOUS | Status: DC | PRN

## 2019-12-08 MED ORDER — LACTATED RINGERS IV SOLN: INTRAVENOUS | Status: DC

## 2019-12-08 MED ORDER — ONDANSETRON HCL 4 MG/2ML IJ SOLN
INTRAMUSCULAR | Status: DC | PRN
  Administered 2019-12-08: 4 mg via INTRAVENOUS

## 2019-12-08 MED ORDER — CEFAZOLIN SODIUM-DEXTROSE 2-4 GM/100ML-% IV SOLN
2.0000 g | INTRAVENOUS | Status: AC
  Administered 2019-12-08: 2 g via INTRAVENOUS
  Filled 2019-12-08: qty 100

## 2019-12-08 MED ORDER — LIDOCAINE 2% (20 MG/ML) 5 ML SYRINGE
INTRAMUSCULAR | Status: DC | PRN
  Administered 2019-12-08: 100 mg via INTRAVENOUS

## 2019-12-08 SURGICAL SUPPLY — 24 items
BAG URO CATCHER STRL LF (MISCELLANEOUS) ×3 IMPLANT
BASKET LASER NITINOL 1.9FR (BASKET) IMPLANT
BASKET ZERO TIP NITINOL 2.4FR (BASKET) ×3 IMPLANT
BSKT STON RTRVL 120 1.9FR (BASKET)
BSKT STON RTRVL ZERO TP 2.4FR (BASKET) ×1
CATH INTERMIT  6FR 70CM (CATHETERS) ×3 IMPLANT
CLOTH BEACON ORANGE TIMEOUT ST (SAFETY) ×3 IMPLANT
EXTRACTOR STONE 1.7FRX115CM (UROLOGICAL SUPPLIES) ×3 IMPLANT
GLOVE BIO SURGEON STRL SZ7.5 (GLOVE) ×3 IMPLANT
GOWN STRL REUS W/TWL XL LVL3 (GOWN DISPOSABLE) ×3 IMPLANT
GUIDEWIRE ANG ZIPWIRE 038X150 (WIRE) IMPLANT
GUIDEWIRE STR DUAL SENSOR (WIRE) ×3 IMPLANT
KIT TURNOVER KIT A (KITS) IMPLANT
LASER FIB FLEXIVA PULSE ID 365 (Laser) IMPLANT
MANIFOLD NEPTUNE II (INSTRUMENTS) ×3 IMPLANT
PACK CYSTO (CUSTOM PROCEDURE TRAY) ×3 IMPLANT
SHEATH URETERAL 12FRX28CM (UROLOGICAL SUPPLIES) IMPLANT
SHEATH URETERAL 12FRX35CM (MISCELLANEOUS) IMPLANT
STENT URET 6FRX26 CONTOUR (STENTS) ×3 IMPLANT
TRACTIP FLEXIVA PULS ID 200XHI (Laser) IMPLANT
TRACTIP FLEXIVA PULSE ID 200 (Laser)
TUBING CONNECTING 10 (TUBING) ×2 IMPLANT
TUBING CONNECTING 10' (TUBING) ×1
TUBING UROLOGY SET (TUBING) ×3 IMPLANT

## 2019-12-08 NOTE — Discharge Instructions (Signed)

## 2019-12-08 NOTE — Anesthesia Procedure Notes (Signed)
Procedure Name: LMA Insertion Date/Time: 12/08/2019 4:16 PM Performed by: Lorelee Market, CRNA Pre-anesthesia Checklist: Patient identified, Emergency Drugs available, Suction available and Patient being monitored Patient Re-evaluated:Patient Re-evaluated prior to induction Oxygen Delivery Method: Circle system utilized Preoxygenation: Pre-oxygenation with 100% oxygen Induction Type: IV induction LMA: LMA with gastric port inserted LMA Size: 5.0 Number of attempts: 1 Placement Confirmation: positive ETCO2 and breath sounds checked- equal and bilateral Tube secured with: Tape Dental Injury: Teeth and Oropharynx as per pre-operative assessment

## 2019-12-08 NOTE — Anesthesia Postprocedure Evaluation (Signed)
Anesthesia Post Note  Patient: Tyler Bridges  Procedure(s) Performed: RIGHT URETEROSCOPY RETROGRADE PYELOGRAM STENT PLACEMENT (Right )     Patient location during evaluation: PACU Anesthesia Type: General Level of consciousness: awake Pain management: pain level controlled Vital Signs Assessment: post-procedure vital signs reviewed and stable Respiratory status: spontaneous breathing Cardiovascular status: stable Anesthetic complications: no   No complications documented.  Last Vitals:  Vitals:   12/08/19 1716 12/08/19 1721  BP: (!) 164/82 (!) 154/82  Pulse: 65 68  Resp: 18 20  Temp:  36.6 C  SpO2: 100% 94%    Last Pain:  Vitals:   12/08/19 1721  TempSrc:   PainSc: 0-No pain                 Aadyn Buchheit

## 2019-12-08 NOTE — Transfer of Care (Signed)
Immediate Anesthesia Transfer of Care Note  Patient: Tyler Bridges  Procedure(s) Performed: RIGHT URETEROSCOPY RETROGRADE PYELOGRAM STENT PLACEMENT (Right )  Patient Location: PACU  Anesthesia Type:General  Level of Consciousness: awake, alert , oriented and patient cooperative  Airway & Oxygen Therapy: Patient Spontanous Breathing and Patient connected to face mask oxygen  Post-op Assessment: Report given to RN, Post -op Vital signs reviewed and stable and Patient moving all extremities  Post vital signs: Reviewed and stable  Last Vitals:  Vitals Value Taken Time  BP 167/80 12/08/19 1654  Temp    Pulse 72 12/08/19 1655  Resp 18 12/08/19 1655  SpO2 100 % 12/08/19 1655  Vitals shown include unvalidated device data.  Last Pain:  Vitals:   12/08/19 1348  TempSrc:   PainSc: 0-No pain      Patients Stated Pain Goal: 3 (12/05/19 1246)  Complications: No complications documented.

## 2019-12-08 NOTE — Op Note (Signed)
Operative Note  Preoperative diagnosis:  1.  Right ureteral calculus  Postoperative diagnosis: 1.  Right ureteral calculus  Procedure(s): 1.  Cystoscopy with right retrograde pyelogram, right ureteroscopy with stone extraction, right ureteral stent placement  Surgeon: Modena Slater, MD  Assistants: None  Anesthesia: General  Complications: None immediate  EBL: Minimal  Specimens: 1.  None  Drains/Catheters: 1.  6 x 26 double-J ureteral stent  Intraoperative findings: 1.  Normal anterior urethra 2.  Borderline obstructing prostate 3.  Normal bladder mucosa 4.  Right retrograde pyelogram revealed upstream hydroureteronephrosis.  There was a 6 mm stone crowning from the right ureteral orifice that was able to be basket extracted through the ureteroscope.  No other stone seen on ureteroscopy.  Indication: 74 year old male failed trial passage of a ureteral stone presents for the previously mentioned operation  Description of procedure:  The patient was identified and consent was obtained.  The patient was taken to the operating room and placed in the supine position.  The patient was placed under general anesthesia.  Perioperative antibiotics were administered.  The patient was placed in dorsal lithotomy.  Patient was prepped and draped in a standard sterile fashion and a timeout was performed.  A 21 French rigid cystoscope was advanced into the urethra and into the bladder.  Complete cystoscopy was performed with no abnormal findings.  The right ureter was cannulated with a sensor wire which was advanced up to the kidney under fluoroscopic guidance.  This was secured to the drape as a safety wire.  A semirigid ureteroscope was advanced alongside the wire up to the stone of interest which was crowning at the ureteral orifice.  I basket was able to basket extract the stone.  I advanced the scope up to the renal pelvis and no other calculi were seen.  I shot a retrograde pyelogram through  the scope with findings noted above.  I then withdrew the scope visualizing the entire ureter upon removal and no other calculi were seen.  There was a fair amount of distal ureteral edema and therefore I decided to leave a stent.  The wire was backloaded onto a rigid cystoscope and advanced that into the bladder followed by routine placement of a 6 x 26 double-J ureteral stent.  Fluoroscopy confirmed proximal placement and direct visualization confirmed a good coil within the bladder.  I drained the bladder and withdrew the scope.  Patient tolerated procedure well was stable postoperatively.  Plan: Return in 1 week for stent removal

## 2019-12-08 NOTE — Anesthesia Preprocedure Evaluation (Addendum)
Anesthesia Evaluation  Patient identified by MRN, date of birth, ID band Patient awake    Reviewed: Allergy & Precautions, NPO status , Patient's Chart, lab work & pertinent test results  Airway Mallampati: II  TM Distance: >3 FB Neck ROM: Full    Dental no notable dental hx. (+) Teeth Intact, Dental Advisory Given   Pulmonary neg pulmonary ROS, former smoker,    Pulmonary exam normal breath sounds clear to auscultation       Cardiovascular hypertension, negative cardio ROS Normal cardiovascular exam Rhythm:Regular Rate:Normal  HLD  TTE 2006  Normal EF, valves ok   Neuro/Psych negative neurological ROS  negative psych ROS   GI/Hepatic Neg liver ROS, PUD, GERD  Medicated,  Endo/Other  negative endocrine ROSdiabetes, Oral Hypoglycemic Agents  Renal/GU negative Renal ROS  negative genitourinary   Musculoskeletal  (+) Arthritis ,   Abdominal   Peds  Hematology negative hematology ROS (+)   Anesthesia Other Findings Right ureteral stone  Reproductive/Obstetrics                            Anesthesia Physical Anesthesia Plan  ASA: III  Anesthesia Plan: General   Post-op Pain Management:    Induction: Intravenous  PONV Risk Score and Plan: Ondansetron, Dexamethasone and Midazolam  Airway Management Planned: LMA  Additional Equipment:   Intra-op Plan:   Post-operative Plan: Extubation in OR  Informed Consent: I have reviewed the patients History and Physical, chart, labs and discussed the procedure including the risks, benefits and alternatives for the proposed anesthesia with the patient or authorized representative who has indicated his/her understanding and acceptance.     Dental advisory given  Plan Discussed with: CRNA  Anesthesia Plan Comments:         Anesthesia Quick Evaluation
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
## 2019-12-09 ENCOUNTER — Encounter (HOSPITAL_COMMUNITY): Payer: Self-pay | Admitting: Urology

## 2019-12-09 MED FILL — HYDROCODON-APAP 5-325: 5-325 | 3 days supply | Qty: 10 | Fill #0

## 2019-12-18 DIAGNOSIS — N201 Calculus of ureter: Secondary | ICD-10-CM | POA: Diagnosis not present

## 2020-02-19 ENCOUNTER — Other Ambulatory Visit: Payer: Self-pay | Admitting: Internal Medicine

## 2020-02-19 MED FILL — METFORMIN HCL ER 500 MG TB2: 500 | 90 days supply | Qty: 270 | Fill #1

## 2020-02-19 MED FILL — PANTOPRAZOLE SOD DR 40 MG T: 40 | 90 days supply | Qty: 180 | Fill #2

## 2020-02-20 ENCOUNTER — Encounter: Payer: Self-pay | Admitting: Student in an Organized Health Care Education/Training Program

## 2020-02-20 MED FILL — LISINOPRIL 20 MG TABLET: 20 | 90 days supply | Qty: 90 | Fill #3

## 2020-02-20 NOTE — Telephone Encounter (Signed)
I will approve this one refill. He definitely needs a follow up appointment with me. It has been almost 2 years. Should be seen at least every 6 months to assess safety of continuing tramadol.

## 2020-02-23 ENCOUNTER — Other Ambulatory Visit: Payer: Self-pay | Admitting: Internal Medicine

## 2020-02-23 ENCOUNTER — Other Ambulatory Visit: Payer: Self-pay | Admitting: Student in an Organized Health Care Education/Training Program

## 2020-02-23 MED FILL — traMADol HCL 50 MG TABS: 50 | 20 days supply | Qty: 120 | Fill #0

## 2020-03-01 ENCOUNTER — Encounter: Payer: PPO | Admitting: Student in an Organized Health Care Education/Training Program

## 2020-04-21 ENCOUNTER — Other Ambulatory Visit: Payer: Self-pay | Admitting: Student in an Organized Health Care Education/Training Program

## 2020-04-21 ENCOUNTER — Other Ambulatory Visit: Payer: Self-pay | Admitting: *Deleted

## 2020-04-21 MED ORDER — TRAMADOL HCL 50 MG PO TABS: 100.0000 mg | ORAL_TABLET | Freq: Three times a day (TID) | ORAL | 5 refills | Status: DC | PRN

## 2020-04-21 MED FILL — traMADol HCL 50 MG TABS: 50 | 20 days supply | Qty: 120 | Fill #0

## 2020-05-07 MED FILL — PANTOPRAZOLE SOD DR 40 MG T: 40 | 90 days supply | Qty: 180 | Fill #3

## 2020-05-28 MED FILL — METFORMIN HCL ER 500 MG TB2: 500 | 90 days supply | Qty: 270 | Fill #2

## 2020-06-07 ENCOUNTER — Other Ambulatory Visit: Payer: Self-pay | Admitting: Student in an Organized Health Care Education/Training Program

## 2020-06-07 DIAGNOSIS — I10 Essential (primary) hypertension: Secondary | ICD-10-CM

## 2020-06-07 MED FILL — LISINOPRIL 20 MG TABLET: 20 | 90 days supply | Qty: 90 | Fill #0

## 2020-06-28 MED FILL — traMADol HCL 50 MG TABS: 50 | 20 days supply | Qty: 120 | Fill #1

## 2020-08-16 ENCOUNTER — Other Ambulatory Visit (HOSPITAL_COMMUNITY): Payer: Self-pay

## 2020-08-16 ENCOUNTER — Other Ambulatory Visit: Payer: Self-pay | Admitting: Student in an Organized Health Care Education/Training Program

## 2020-08-16 DIAGNOSIS — K2100 Gastro-esophageal reflux disease with esophagitis, without bleeding: Secondary | ICD-10-CM

## 2020-08-16 MED FILL — Metformin HCl Tab ER 24HR 500 MG: ORAL | 90 days supply | Qty: 270 | Fill #0 | Status: AC

## 2020-08-16 MED FILL — Tramadol HCl Tab 50 MG: ORAL | 20 days supply | Qty: 120 | Fill #0 | Status: AC

## 2020-08-16 MED FILL — Lisinopril Tab 20 MG: ORAL | 90 days supply | Qty: 90 | Fill #0 | Status: AC

## 2020-08-17 ENCOUNTER — Other Ambulatory Visit (HOSPITAL_COMMUNITY): Payer: Self-pay

## 2020-08-17 MED ORDER — PANTOPRAZOLE SODIUM 40 MG PO TBEC
40.0000 mg | DELAYED_RELEASE_TABLET | Freq: Two times a day (BID) | ORAL | 3 refills | Status: DC
  Filled 2020-08-17: qty 180, 90d supply, fill #0
  Filled 2020-11-22: qty 180, 90d supply, fill #1

## 2020-08-18 ENCOUNTER — Other Ambulatory Visit (HOSPITAL_COMMUNITY): Payer: Self-pay

## 2020-08-30 DIAGNOSIS — H11043 Peripheral pterygium, stationary, bilateral: Secondary | ICD-10-CM | POA: Diagnosis not present

## 2020-08-30 DIAGNOSIS — Z961 Presence of intraocular lens: Secondary | ICD-10-CM | POA: Diagnosis not present

## 2020-08-30 DIAGNOSIS — E119 Type 2 diabetes mellitus without complications: Secondary | ICD-10-CM | POA: Diagnosis not present

## 2020-08-30 DIAGNOSIS — H43811 Vitreous degeneration, right eye: Secondary | ICD-10-CM | POA: Diagnosis not present

## 2020-09-23 ENCOUNTER — Encounter (HOSPITAL_BASED_OUTPATIENT_CLINIC_OR_DEPARTMENT_OTHER): Payer: Self-pay | Admitting: Emergency Medicine

## 2020-09-23 ENCOUNTER — Emergency Department (HOSPITAL_BASED_OUTPATIENT_CLINIC_OR_DEPARTMENT_OTHER)
Admission: EM | Admit: 2020-09-23 | Discharge: 2020-09-23 | Disposition: A | Discharge status: Home / Self Care | Payer: PPO | Attending: Emergency Medicine | Admitting: Emergency Medicine

## 2020-09-23 ENCOUNTER — Emergency Department (HOSPITAL_BASED_OUTPATIENT_CLINIC_OR_DEPARTMENT_OTHER): Payer: PPO

## 2020-09-23 ENCOUNTER — Other Ambulatory Visit: Payer: Self-pay

## 2020-09-23 ENCOUNTER — Other Ambulatory Visit (HOSPITAL_COMMUNITY): Payer: Self-pay

## 2020-09-23 DIAGNOSIS — Z7984 Long term (current) use of oral hypoglycemic drugs: Secondary | ICD-10-CM | POA: Diagnosis not present

## 2020-09-23 DIAGNOSIS — Z87891 Personal history of nicotine dependence: Secondary | ICD-10-CM | POA: Diagnosis not present

## 2020-09-23 DIAGNOSIS — W07XXXA Fall from chair, initial encounter: Secondary | ICD-10-CM | POA: Diagnosis not present

## 2020-09-23 DIAGNOSIS — R0781 Pleurodynia: Secondary | ICD-10-CM

## 2020-09-23 DIAGNOSIS — Z79899 Other long term (current) drug therapy: Secondary | ICD-10-CM | POA: Diagnosis not present

## 2020-09-23 DIAGNOSIS — S29001A Unspecified injury of muscle and tendon of front wall of thorax, initial encounter: Secondary | ICD-10-CM | POA: Insufficient documentation

## 2020-09-23 DIAGNOSIS — Z7982 Long term (current) use of aspirin: Secondary | ICD-10-CM | POA: Insufficient documentation

## 2020-09-23 DIAGNOSIS — I10 Essential (primary) hypertension: Secondary | ICD-10-CM | POA: Diagnosis not present

## 2020-09-23 DIAGNOSIS — E1140 Type 2 diabetes mellitus with diabetic neuropathy, unspecified: Secondary | ICD-10-CM | POA: Insufficient documentation

## 2020-09-23 DIAGNOSIS — W19XXXA Unspecified fall, initial encounter: Secondary | ICD-10-CM

## 2020-09-23 MED ORDER — METHOCARBAMOL 500 MG PO TABS
500.0000 mg | ORAL_TABLET | Freq: Two times a day (BID) | ORAL | 0 refills | Status: DC
Start: 2020-09-23 — End: 2021-02-21
  Filled 2020-09-23: qty 20, 10d supply, fill #0

## 2020-09-23 MED ORDER — ACETAMINOPHEN ER 650 MG PO TBCR
650.0000 mg | EXTENDED_RELEASE_TABLET | Freq: Three times a day (TID) | ORAL | 0 refills | Status: DC | PRN
Start: 2020-09-23 — End: 2023-08-10
  Filled 2020-09-23: qty 20, 7d supply, fill #0

## 2020-09-23 MED ORDER — METHOCARBAMOL 500 MG PO TABS
500.0000 mg | ORAL_TABLET | Freq: Once | ORAL | Status: AC
  Administered 2020-09-23: 500 mg via ORAL
  Filled 2020-09-23: qty 1

## 2020-09-23 MED ORDER — ACETAMINOPHEN 325 MG PO TABS
650.0000 mg | ORAL_TABLET | Freq: Once | ORAL | Status: AC
  Administered 2020-09-23: 650 mg via ORAL
  Filled 2020-09-23: qty 2
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
## 2020-09-23 NOTE — ED Triage Notes (Signed)
Pt states he fell backwards out of a chair on Monday, hitting his right lower rib area on the arm of the chair.  Pt reports mild pain at the time, worsening since.

## 2020-09-23 NOTE — Discharge Instructions (Addendum)
It was a pleasure taking care of your today. You were seen for rib pain.  Your x-ray was negative for any rib fractures.  I am sending you home with pain medication and a muscle relaxer.  Take as needed for pain.  Muscle relaxer can cause drowsiness so do not drive or operate machinery while on the medication.  You may also purchase over-the-counter Lidoderm patches and Voltaren gel for added pain relief.  Please follow-up with PCP within the next week for further evaluation.  Return to the ER for new or worsening symptoms.

## 2020-09-24 ENCOUNTER — Other Ambulatory Visit (HOSPITAL_COMMUNITY): Payer: Self-pay

## 2020-10-14 DIAGNOSIS — H43811 Vitreous degeneration, right eye: Secondary | ICD-10-CM | POA: Diagnosis not present

## 2020-10-14 DIAGNOSIS — H11043 Peripheral pterygium, stationary, bilateral: Secondary | ICD-10-CM | POA: Diagnosis not present

## 2020-10-14 DIAGNOSIS — R519 Headache, unspecified: Secondary | ICD-10-CM | POA: Diagnosis not present

## 2020-10-14 DIAGNOSIS — Z961 Presence of intraocular lens: Secondary | ICD-10-CM | POA: Diagnosis not present

## 2020-10-14 DIAGNOSIS — E119 Type 2 diabetes mellitus without complications: Secondary | ICD-10-CM | POA: Diagnosis not present

## 2020-10-14 DIAGNOSIS — M316 Other giant cell arteritis: Secondary | ICD-10-CM | POA: Diagnosis not present

## 2020-10-19 ENCOUNTER — Other Ambulatory Visit: Payer: Self-pay | Admitting: Student in an Organized Health Care Education/Training Program

## 2020-10-19 ENCOUNTER — Other Ambulatory Visit (HOSPITAL_COMMUNITY): Payer: Self-pay

## 2020-10-19 DIAGNOSIS — R51 Headache with orthostatic component, not elsewhere classified: Secondary | ICD-10-CM | POA: Diagnosis not present

## 2020-10-20 ENCOUNTER — Other Ambulatory Visit (HOSPITAL_COMMUNITY): Payer: Self-pay

## 2020-10-20 MED ORDER — TRAMADOL HCL 50 MG PO TABS
100.0000 mg | ORAL_TABLET | Freq: Three times a day (TID) | ORAL | 2 refills | Status: DC | PRN
  Filled 2020-10-20: qty 120, 20d supply, fill #0

## 2020-10-20 NOTE — Telephone Encounter (Signed)
Pt has an appt tomorrow w/Dr Claudette Laws.

## 2020-10-21 ENCOUNTER — Encounter: Payer: Self-pay | Admitting: Student

## 2020-10-21 ENCOUNTER — Other Ambulatory Visit (HOSPITAL_COMMUNITY): Payer: Self-pay

## 2020-10-21 ENCOUNTER — Ambulatory Visit (INDEPENDENT_AMBULATORY_CARE_PROVIDER_SITE_OTHER): Payer: PPO | Admitting: Student

## 2020-10-21 ENCOUNTER — Other Ambulatory Visit: Payer: Self-pay

## 2020-10-21 VITALS — BP 143/77 | HR 78 | Temp 98.2°F | Ht 68.0 in | Wt 195.4 lb

## 2020-10-21 DIAGNOSIS — R519 Headache, unspecified: Secondary | ICD-10-CM

## 2020-10-21 DIAGNOSIS — Z0000 Encounter for general adult medical examination without abnormal findings: Secondary | ICD-10-CM

## 2020-10-21 DIAGNOSIS — H6123 Impacted cerumen, bilateral: Secondary | ICD-10-CM | POA: Diagnosis not present

## 2020-10-21 DIAGNOSIS — E1140 Type 2 diabetes mellitus with diabetic neuropathy, unspecified: Secondary | ICD-10-CM | POA: Diagnosis not present

## 2020-10-21 DIAGNOSIS — I10 Essential (primary) hypertension: Secondary | ICD-10-CM

## 2020-10-21 DIAGNOSIS — N521 Erectile dysfunction due to diseases classified elsewhere: Secondary | ICD-10-CM | POA: Diagnosis not present

## 2020-10-21 LAB — GLUCOSE, CAPILLARY: Glucose-Capillary: 165 mg/dL — ABNORMAL HIGH (ref 70–99)

## 2020-10-21 LAB — POCT GLYCOSYLATED HEMOGLOBIN (HGB A1C): Hemoglobin A1C: 7.4 % — AB (ref 4.0–5.6)

## 2020-10-21 MED ORDER — DEBROX 6.5 % OT SOLN
5.0000 [drp] | Freq: Two times a day (BID) | OTIC | 0 refills | Status: DC
Start: 2020-10-21 — End: 2021-02-21
  Filled 2020-10-21: qty 15, 30d supply, fill #0
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
## 2020-10-21 NOTE — Patient Instructions (Addendum)
Tyler Bridges,   Thank you for your visit to the [facility] today. It was a pleasure meeting you. Today we discussed the following:  1) Headaches - I have ordered a CT scan of your head. Once this is scheduled, please call the clinic and schedule your next appointment for 1-2 days after the head imaging. - Hydrate hydrate hydrate. Take tylenol or ibuprofen as needed, do not exceed the daily recommended amount. - Keep on the lookout and let us know if you develop fever, neck stiffness, rash  We would like to see you back in ~2 weeks/after your CT head. Please bring all of your medications with you.   If you have any questions or concerns, please call our clinic at [phone] between 9am-5pm. Outside of these hours, call [phone] and ask for the internal medicine resident on call. If you feel you are having a medical emergency please call 911.

## 2020-10-22 DIAGNOSIS — H6120 Impacted cerumen, unspecified ear: Secondary | ICD-10-CM | POA: Insufficient documentation

## 2020-10-22 DIAGNOSIS — R519 Headache, unspecified: Secondary | ICD-10-CM | POA: Insufficient documentation

## 2020-10-22 NOTE — Assessment & Plan Note (Addendum)
Patient presents for evaluation of new-onset headaches for the last 2 weeks. His last [facility] appointment was in 03/2018 with his then-PCP, Dr. Josem Kaufmann.   BP today is 143/77. He reports adherence to lisinopril 20 mg daily. He denies chest pain, shortness of breath. He does not check his BP at home. His wife mentions he tends to add salt to his food.  Plan: Patient's BP is not at goal however today's visit was focused on his new-onset headaches. Recommend addressing his chronic medical conditions at next follow-up. He may benefit from addition of another antihypertensive agent. - continue lisinopril 20 mg daily

## 2020-10-22 NOTE — Assessment & Plan Note (Addendum)
Patient presents for evaluation of new-onset headaches. He reports he was in his usual state of health until ~2 weeks ago when around ~1pm in the afternoon he developed a pressure/pain just over his forehead above his eyes. The pain improved slightly with Tylenol, however ultimately the pain intensified until he finally fell asleep that evening. The next morning, the headache was gone, however it subsequently recurred every afternoon since that first day.  He denies prodromal symptoms of abnormal smells or flashing lights. The headaches have not been associated with nausea, vomiting, tearing, congestion. He denies recent fevers, chills, neck stiffness, rashes, known tick bites, numbness, weakness, blurry vision, jaw claudication. Denies sick contacts. 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 Reports he stays well-hydrated because his wife makes sure he does.  He reports he sought evaluation at the ophthalmologist (Dr. Dione Booze) after he noticed a new, large floater in his vision last week. Per the patient's report, the ophthalmologist did a complete eye exam and did some bloodwork "for something serious." When I asked if the eye doctor was concerned about possible Giant Cell Arteritis, the patient said yes and that blood work Dr. Dione Booze obtained came back recently and made him less suspicious for GCA.   On my exam, patient is well appearing. Neuro exam is non-focal. There is mild tenderness to palpation of his R temple.  Assessment/Plan: New-onset headache in a 75 year old man is a red flag which warrants head imaging to evaluate for possible intracranial etiology despite his normal neuro exam. He has recently been seen by ophthalmology and reportedly had a normal eye exam. Differential includes tension headache, migraine headache (less likely without associated symptoms), cluster headaches (less likely without rhinorrhea or tearing), tick-borne illness (less  likely without fever or rash). Counseled the patient to stay hydrated and use Tylenol/Ibuprofen as needed. Return precautions given and counseled patient to monitor for fever, neck stiffness, rash. - CT head without contrast --> ADDENDUM (10/25/20): patient had CT head today which was NEGATIVE for acute intracranial abnormality, showed mild atrophy and chronic small-vessel white matter ischemic changes, chronic left sphenoid sinusitis. Called to discuss with patient who reports his headaches are ongoing. He had not yet tried ibuprofen. Advised him try ibuprofen and schedule next available with his PCP (Dr. Oswaldo Done).  - RTC after head imaging - Hydration, Tylenol and/or Ibuprofen PRN

## 2020-10-22 NOTE — Assessment & Plan Note (Signed)
Patient presents for evaluation of new-onset headaches for the last 2 weeks. His last [facility] appointment was in 03/2018 with his then-PCP, Dr. Josem Kaufmann.   Hemoglobin A1c today is 7.4%. A1c at last check was 7.7% in 12/2019. He reports adherence to metformin 1500 mg daily. He states he has been trying to improve his diet and exercise more. Today his weight is 195 lb, which is 11 lb less than his last PCP visit in 03/2018 (206 lb)  Plan: Given today's focus on patient's new-onset headaches, I did not make any adjustments to his diabetes regimen. Recommend addressing his chronic medical conditions at next follow-up.  - continue metformin 1500 mg daily - patient is in need of foot exam, ensure documentation of diabetic eye exam, lipid panel

## 2020-10-22 NOTE — Assessment & Plan Note (Signed)
Today's visit was focused on his new-onset headaches. Recommend addressing at subsequent visits: - Shingles vaccine - COVID vaccine: of note, patient and his wife report they both had COVID twice, once in 2019 and subsequently in 2022. Patient states he will not be getting immunized against COVID given that 4 friends had strokes after the vaccine. - Colonoscopy

## 2020-10-22 NOTE — Assessment & Plan Note (Addendum)
Patient reports he has had bilateral tinnitus for many years which he attributes to hunting and skeet shooting since childhood. He reports he has noticed decreased hearing, L>R, and has a history of requiring removal of ear wax, most recently 3-5 years ago by an ENT.  On exam, there is significant cerumen burden bilaterally which obscures the tympanic membranes.  A/P: Bilateral cerumen impaction affecting the patients hearing. Will prescribe Debrox drops and plan for irrigation at next visit. Alternatively, patient states he may schedule with the ENT who did the irrigation previously. - Debrox  - Irrigation at follow-up vs follow-up at ENT (patient to decide) --> ADDENDUM (10/25/20): when called to discuss results of CTH, patient states he plans to follow-up with ENT regarding cerumen impaction

## 2020-10-25 ENCOUNTER — Ambulatory Visit (HOSPITAL_COMMUNITY)
Admission: RE | Admit: 2020-10-25 | Discharge: 2020-10-25 | Disposition: A | Discharge status: Home / Self Care | Payer: PPO | Source: Ambulatory Visit | Attending: Internal Medicine | Admitting: Internal Medicine

## 2020-10-25 ENCOUNTER — Other Ambulatory Visit: Payer: Self-pay

## 2020-10-25 DIAGNOSIS — R519 Headache, unspecified: Secondary | ICD-10-CM | POA: Diagnosis not present

## 2020-10-26 ENCOUNTER — Telehealth: Payer: Self-pay | Admitting: Student in an Organized Health Care Education/Training Program

## 2020-10-26 DIAGNOSIS — H6123 Impacted cerumen, bilateral: Secondary | ICD-10-CM

## 2020-10-26 NOTE — Telephone Encounter (Signed)
Good Morning.  Please advise if a new Referral can be placed for this patient for an ENT.  Patient wife states he kept his CT on yesterday and needs to have his Sinuses checked by an ENT as soon as possible.

## 2020-10-26 NOTE — Telephone Encounter (Signed)
I spoke with the patient regarding his [facility] results which showed chronic ethmoid sinusitis. He has bilateral cerumen impaction which he prefers to see ENT for. I have placed the referral.

## 2020-11-02 DIAGNOSIS — H6123 Impacted cerumen, bilateral: Secondary | ICD-10-CM | POA: Diagnosis not present

## 2020-11-02 DIAGNOSIS — J3489 Other specified disorders of nose and nasal sinuses: Secondary | ICD-10-CM | POA: Diagnosis not present

## 2020-11-04 ENCOUNTER — Other Ambulatory Visit (HOSPITAL_COMMUNITY): Payer: Self-pay

## 2020-11-04 NOTE — Progress Notes (Signed)
nternal Medicine Clinic Attending  Case discussed with Dr. Claudette Laws  At the time of the visit.  We reviewed the resident's history and exam and pertinent patient test results.  I agree with the assessment, diagnosis, and plan of care documented in the resident's note.  Sphenoid sinusitis can cause head pain though not typically in the distribution described by Tyler Bridges.  The daily recurrence in the afternoons is interesting.  Agree with plan.

## 2020-11-05 ENCOUNTER — Other Ambulatory Visit (HOSPITAL_COMMUNITY): Payer: Self-pay

## 2020-11-05 MED ORDER — AMOXICILLIN-POT CLAVULANATE 875-125 MG PO TABS
1.0000 | ORAL_TABLET | Freq: Two times a day (BID) | ORAL | 0 refills | Status: AC
  Filled 2020-11-05: qty 20, 10d supply, fill #0

## 2020-11-09 ENCOUNTER — Encounter: Payer: Self-pay | Admitting: *Deleted

## 2020-11-12 ENCOUNTER — Other Ambulatory Visit: Payer: Self-pay | Admitting: Physician Assistant

## 2020-11-12 DIAGNOSIS — R930 Abnormal findings on diagnostic imaging of skull and head, not elsewhere classified: Secondary | ICD-10-CM

## 2020-11-12 DIAGNOSIS — R519 Headache, unspecified: Secondary | ICD-10-CM

## 2020-11-15 ENCOUNTER — Other Ambulatory Visit: Payer: Self-pay | Admitting: Physician Assistant

## 2020-11-15 DIAGNOSIS — R930 Abnormal findings on diagnostic imaging of skull and head, not elsewhere classified: Secondary | ICD-10-CM

## 2020-11-15 DIAGNOSIS — R519 Headache, unspecified: Secondary | ICD-10-CM

## 2020-11-22 ENCOUNTER — Other Ambulatory Visit: Payer: Self-pay | Admitting: Internal Medicine

## 2020-11-22 ENCOUNTER — Other Ambulatory Visit (HOSPITAL_COMMUNITY): Payer: Self-pay

## 2020-11-22 DIAGNOSIS — E1140 Type 2 diabetes mellitus with diabetic neuropathy, unspecified: Secondary | ICD-10-CM

## 2020-11-22 DIAGNOSIS — N521 Erectile dysfunction due to diseases classified elsewhere: Secondary | ICD-10-CM

## 2020-11-22 MED FILL — Lisinopril Tab 20 MG: ORAL | 90 days supply | Qty: 90 | Fill #1 | Status: AC

## 2020-11-23 ENCOUNTER — Other Ambulatory Visit (HOSPITAL_COMMUNITY): Payer: Self-pay

## 2020-11-23 MED ORDER — METFORMIN HCL ER 500 MG PO TB24
1500.0000 mg | ORAL_TABLET | Freq: Every day | ORAL | 3 refills | Status: DC
  Filled 2020-11-23: qty 270, 90d supply, fill #0

## 2020-11-24 ENCOUNTER — Other Ambulatory Visit: Payer: Self-pay

## 2020-11-24 ENCOUNTER — Ambulatory Visit
Admission: RE | Admit: 2020-11-24 | Discharge: 2020-11-24 | Disposition: A | Discharge status: Home / Self Care | Payer: PPO | Source: Ambulatory Visit | Attending: Physician Assistant | Admitting: Physician Assistant

## 2020-11-24 ENCOUNTER — Other Ambulatory Visit (HOSPITAL_COMMUNITY): Payer: Self-pay

## 2020-11-24 DIAGNOSIS — R930 Abnormal findings on diagnostic imaging of skull and head, not elsewhere classified: Secondary | ICD-10-CM

## 2020-11-24 DIAGNOSIS — R519 Headache, unspecified: Secondary | ICD-10-CM | POA: Diagnosis not present

## 2020-11-24 DIAGNOSIS — I739 Peripheral vascular disease, unspecified: Secondary | ICD-10-CM | POA: Diagnosis not present

## 2020-11-24 DIAGNOSIS — G8929 Other chronic pain: Secondary | ICD-10-CM

## 2020-11-24 MED ORDER — GADOBENATE DIMEGLUMINE 529 MG/ML IV SOLN
18.0000 mL | Freq: Once | INTRAVENOUS | Status: AC | PRN
  Administered 2020-11-24: 18 mL via INTRAVENOUS

## 2020-12-22 DIAGNOSIS — R51 Headache with orthostatic component, not elsewhere classified: Secondary | ICD-10-CM | POA: Diagnosis not present

## 2021-01-04 ENCOUNTER — Other Ambulatory Visit: Payer: Self-pay

## 2021-01-04 ENCOUNTER — Encounter (HOSPITAL_BASED_OUTPATIENT_CLINIC_OR_DEPARTMENT_OTHER): Payer: Self-pay

## 2021-01-04 DIAGNOSIS — W540XXA Bitten by dog, initial encounter: Secondary | ICD-10-CM | POA: Insufficient documentation

## 2021-01-04 DIAGNOSIS — E1169 Type 2 diabetes mellitus with other specified complication: Secondary | ICD-10-CM | POA: Insufficient documentation

## 2021-01-04 DIAGNOSIS — Z7982 Long term (current) use of aspirin: Secondary | ICD-10-CM | POA: Diagnosis not present

## 2021-01-04 DIAGNOSIS — Z79899 Other long term (current) drug therapy: Secondary | ICD-10-CM | POA: Insufficient documentation

## 2021-01-04 DIAGNOSIS — Z7984 Long term (current) use of oral hypoglycemic drugs: Secondary | ICD-10-CM | POA: Diagnosis not present

## 2021-01-04 DIAGNOSIS — Z87891 Personal history of nicotine dependence: Secondary | ICD-10-CM | POA: Diagnosis not present

## 2021-01-04 DIAGNOSIS — Z23 Encounter for immunization: Secondary | ICD-10-CM | POA: Insufficient documentation

## 2021-01-04 DIAGNOSIS — Y92009 Unspecified place in unspecified non-institutional (private) residence as the place of occurrence of the external cause: Secondary | ICD-10-CM | POA: Diagnosis not present

## 2021-01-04 DIAGNOSIS — S01412A Laceration without foreign body of left cheek and temporomandibular area, initial encounter: Secondary | ICD-10-CM | POA: Diagnosis not present

## 2021-01-04 DIAGNOSIS — I10 Essential (primary) hypertension: Secondary | ICD-10-CM | POA: Insufficient documentation

## 2021-01-04 DIAGNOSIS — S0993XA Unspecified injury of face, initial encounter: Secondary | ICD-10-CM | POA: Diagnosis present

## 2021-01-04 NOTE — ED Triage Notes (Signed)
Patient here POV from Home with Animal Bite.  Patient was playing with Dog when he got hit by its Tooth. Patient has approximately 3-4 cm Laceration underneath Left Eye. Dog is UTD on All Vaccinations.  Ambulatory. GCS 15. NAD Noted during Triage.

## 2021-01-05 ENCOUNTER — Other Ambulatory Visit (HOSPITAL_COMMUNITY): Payer: Self-pay

## 2021-01-05 ENCOUNTER — Emergency Department (HOSPITAL_BASED_OUTPATIENT_CLINIC_OR_DEPARTMENT_OTHER)
Admission: EM | Admit: 2021-01-05 | Discharge: 2021-01-05 | Disposition: A | Discharge status: Home / Self Care | Payer: PPO | Attending: Emergency Medicine | Admitting: Emergency Medicine

## 2021-01-05 DIAGNOSIS — S01412A Laceration without foreign body of left cheek and temporomandibular area, initial encounter: Secondary | ICD-10-CM | POA: Diagnosis not present

## 2021-01-05 DIAGNOSIS — W540XXA Bitten by dog, initial encounter: Secondary | ICD-10-CM

## 2021-01-05 MED ORDER — TETANUS-DIPHTH-ACELL PERTUSSIS 5-2.5-18.5 LF-MCG/0.5 IM SUSY
0.5000 mL | PREFILLED_SYRINGE | Freq: Once | INTRAMUSCULAR | Status: AC
  Administered 2021-01-05: 0.5 mL via INTRAMUSCULAR
  Filled 2021-01-05: qty 0.5

## 2021-01-05 MED ORDER — LIDOCAINE-EPINEPHRINE 2 %-1:100000 IJ SOLN
20.0000 mL | Freq: Once | INTRAMUSCULAR | Status: AC
  Administered 2021-01-05: 20 mL
  Filled 2021-01-05: qty 20.4

## 2021-01-05 MED ORDER — AMOXICILLIN-POT CLAVULANATE 875-125 MG PO TABS
1.0000 | ORAL_TABLET | Freq: Two times a day (BID) | ORAL | 0 refills | Status: DC
  Filled 2021-01-05: qty 14, 7d supply, fill #0
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
## 2021-02-21 ENCOUNTER — Ambulatory Visit (INDEPENDENT_AMBULATORY_CARE_PROVIDER_SITE_OTHER): Payer: PPO | Admitting: Student in an Organized Health Care Education/Training Program

## 2021-02-21 ENCOUNTER — Encounter: Payer: Self-pay | Admitting: Student in an Organized Health Care Education/Training Program

## 2021-02-21 ENCOUNTER — Other Ambulatory Visit (HOSPITAL_COMMUNITY): Payer: Self-pay

## 2021-02-21 VITALS — BP 147/71 | HR 68 | Temp 97.9°F | Ht 68.0 in | Wt 201.8 lb

## 2021-02-21 DIAGNOSIS — Z23 Encounter for immunization: Secondary | ICD-10-CM | POA: Diagnosis not present

## 2021-02-21 DIAGNOSIS — N401 Enlarged prostate with lower urinary tract symptoms: Secondary | ICD-10-CM

## 2021-02-21 DIAGNOSIS — N521 Erectile dysfunction due to diseases classified elsewhere: Secondary | ICD-10-CM

## 2021-02-21 DIAGNOSIS — E1140 Type 2 diabetes mellitus with diabetic neuropathy, unspecified: Secondary | ICD-10-CM

## 2021-02-21 DIAGNOSIS — K2100 Gastro-esophageal reflux disease with esophagitis, without bleeding: Secondary | ICD-10-CM

## 2021-02-21 DIAGNOSIS — I10 Essential (primary) hypertension: Secondary | ICD-10-CM | POA: Diagnosis not present

## 2021-02-21 DIAGNOSIS — R351 Nocturia: Secondary | ICD-10-CM

## 2021-02-21 DIAGNOSIS — E781 Pure hyperglyceridemia: Secondary | ICD-10-CM | POA: Diagnosis not present

## 2021-02-21 DIAGNOSIS — E785 Hyperlipidemia, unspecified: Secondary | ICD-10-CM

## 2021-02-21 LAB — POCT GLYCOSYLATED HEMOGLOBIN (HGB A1C): Hemoglobin A1C: 7.6 % — AB (ref 4.0–5.6)

## 2021-02-21 LAB — GLUCOSE, CAPILLARY: Glucose-Capillary: 273 mg/dL — ABNORMAL HIGH (ref 70–99)

## 2021-02-21 MED ORDER — PANTOPRAZOLE SODIUM 40 MG PO TBEC
40.0000 mg | DELAYED_RELEASE_TABLET | Freq: Every day | ORAL | 3 refills | Status: DC
  Filled 2021-02-21: qty 90, 90d supply, fill #0
  Filled 2021-06-06: qty 90, 90d supply, fill #1

## 2021-02-21 MED ORDER — LISINOPRIL 40 MG PO TABS
40.0000 mg | ORAL_TABLET | Freq: Every day | ORAL | 3 refills | Status: DC
  Filled 2021-02-21: qty 90, 90d supply, fill #0
  Filled 2021-06-06: qty 90, 90d supply, fill #1

## 2021-02-21 MED ORDER — TAMSULOSIN HCL 0.4 MG PO CAPS
0.4000 mg | ORAL_CAPSULE | Freq: Every day | ORAL | 0 refills | Status: DC
  Filled 2021-02-21: qty 7, 7d supply, fill #0

## 2021-02-21 MED ORDER — METFORMIN HCL ER 500 MG PO TB24
2000.0000 mg | ORAL_TABLET | Freq: Every day | ORAL | 3 refills | Status: DC
  Filled 2021-02-21: qty 360, 90d supply, fill #0
  Filled 2021-06-06: qty 360, 90d supply, fill #1

## 2021-02-21 NOTE — Assessment & Plan Note (Signed)
Occasional esophageal reflux disease, also at risk for peptic ulcer disease given pretty frequent NSAID use for osteoarthritis.  We will plan to continue pantoprazole 40 mg once daily at least while he is using this much NSAIDs.

## 2021-02-21 NOTE — Assessment & Plan Note (Signed)
Blood pressure elevated above goal today, especially given his good functional status and diabetes.  We will plan to increase lisinopril from 20 mg up to 40 mg daily.  Check BMP today.  Follow-up for blood pressure recheck in 4 months.

## 2021-02-21 NOTE — Assessment & Plan Note (Signed)
Hemoglobin A1c not at goal, currently 7.6%.  Would like to see it under 7.0%.  We will increase metformin from 1500 mg up to 2000 mg daily.  Check urine microalbumin today.  We talked about nutrition interventions.  Follow-up in 3-4 months for repeat A1c.

## 2021-02-21 NOTE — Assessment & Plan Note (Signed)
About 3 or 4 episodes of nocturia nightly, not meeting his expectations.  Plan is to restart tamsulosin 0.4 mg daily.  We will check a PSA as well.

## 2021-02-21 NOTE — Assessment & Plan Note (Addendum)
History of hyperlipidemia, at elevated risk for ischemic events, not able to tolerate statins in the past.  Will check lipid panel today and revisit the issue of his 10-year ASCVD risk.  Addendum: very high cholesterol with very elevated ASCVD risk over 50% for the next 10 years. He has a history of abdominal complaints with atorvastatin, has been off for a few years now. We decided to try Rosuvastatin 20mg , will recheck lipids in 3 months. Low benefit to aspirin for primary prevention.

## 2021-02-21 NOTE — Progress Notes (Deleted)
6 

## 2021-02-21 NOTE — Progress Notes (Signed)
   Assessment and Plan:  See Encounters tab for problem-based medical decision making.   __________________________________________________________  HPI:   75 year old person here for follow-up of diabetes and hypertension.  Patient has no acute complaints today, here for routine follow-up, reports adherence with his medications and no adverse side effects.  Patient enjoys excellent quality of life.  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  Says that he is very active, does a lot of yard work, denies any chest pain or dyspnea with exertion.  Does report soreness and increasing osteoarthritis type pain in his low back and knees on the days after significant exertion.  No recent hospitalizations, no emergency department visits.  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   Hyperlipidemia 02/26/2006   Gastroesophageal reflux disease 02/26/2006    Medications: Reconciled today in Epic __________________________________________________________  Physical Exam:  Vital Signs: Vitals:   02/21/21 1115 02/21/21 1211  BP: (!) 165/77 (!) 147/71  Pulse: 67 68  Temp: 97.9 F (36.6 C)   TempSrc: Oral   SpO2: 99%   Weight: 201 lb 12.8 oz (91.5 kg)   Height: 5\' 8"  (1.727 m)     Gen: Well appearing, NAD Neck: No cervical LAD,  No thyromegaly or nodules CV: RRR, no murmurs Pulm: Normal effort, CTA throughout, no wheezing Abd: Soft, NT, ND Ext: Warm, no edema, anterior crepitus in bilateral knees, no joint effusions, mild varus deformities of both knees, osteoarthritis in bilateral hands with [place] nodules. Skin: No atypical appearing moles. No rashes. Heavily tanned.

## 2021-02-22 ENCOUNTER — Telehealth: Payer: Self-pay

## 2021-02-22 ENCOUNTER — Other Ambulatory Visit (HOSPITAL_COMMUNITY): Payer: Self-pay

## 2021-02-22 LAB — LIPID PANEL
Chol/HDL Ratio: 8.7 ratio — ABNORMAL HIGH (ref 0.0–5.0)
Cholesterol, Total: 260 mg/dL — ABNORMAL HIGH (ref 100–199)
HDL: 30 mg/dL — ABNORMAL LOW (ref >39)
LDL Chol Calc (NIH): 127 mg/dL — ABNORMAL HIGH (ref 0–99)
Triglycerides: 565 mg/dL (ref 0–149)
VLDL Cholesterol Cal: 103 mg/dL — ABNORMAL HIGH (ref 5–40)

## 2021-02-22 LAB — MICROALBUMIN / CREATININE URINE RATIO
Creatinine, Urine: 87.4 mg/dL
Microalb/Creat Ratio: 20 mg/g creat (ref 0–29)
Microalbumin, Urine: 17.6 ug/mL

## 2021-02-22 LAB — BMP8+ANION GAP
Anion Gap: 18 mmol/L (ref 10.0–18.0)
BUN/Creatinine Ratio: 14 (ref 10–24)
BUN: 10 mg/dL (ref 8–27)
CO2: 18 mmol/L — ABNORMAL LOW (ref 20–29)
Calcium: 9.2 mg/dL (ref 8.6–10.2)
Chloride: 102 mmol/L (ref 96–106)
Creatinine, Ser: 0.73 mg/dL — ABNORMAL LOW (ref 0.76–1.27)
Glucose: 215 mg/dL — ABNORMAL HIGH (ref 70–99)
Potassium: 4.4 mmol/L (ref 3.5–5.2)
Sodium: 138 mmol/L (ref 134–144)
eGFR: 94 mL/min/{1.73_m2} (ref >59)

## 2021-02-22 LAB — PSA: Prostate Specific Ag, Serum: 0.7 ng/mL (ref 0.0–4.0)

## 2021-02-22 LAB — VITAMIN B12: Vitamin B-12: 268 pg/mL (ref 232–1245)

## 2021-02-22 MED ORDER — ROSUVASTATIN CALCIUM 20 MG PO TABS
20.0000 mg | ORAL_TABLET | Freq: Every day | ORAL | 3 refills | Status: DC
  Filled 2021-02-22: qty 90, 90d supply, fill #0
  Filled 2021-09-24: qty 90, 90d supply, fill #1
  Filled 2021-12-06: qty 90, 90d supply, fill #2

## 2021-02-22 NOTE — Addendum Note (Signed)
Addended by: Erlinda Hong T on: 02/22/2021 03:40 PM   Modules accepted: Orders

## 2021-02-22 NOTE — Telephone Encounter (Signed)
Noted! Thank you

## 2021-02-22 NOTE — Telephone Encounter (Signed)
Received critical value notification from [facility], lab on:  Triglycerides 565, collection date yesterday (labs are in epic, will place hard copy in MD box). Thank you, SChaplin, RN,BSN

## 2021-02-24 ENCOUNTER — Other Ambulatory Visit (HOSPITAL_COMMUNITY): Payer: Self-pay

## 2021-03-21 ENCOUNTER — Other Ambulatory Visit (HOSPITAL_COMMUNITY): Payer: Self-pay

## 2021-03-21 ENCOUNTER — Other Ambulatory Visit: Payer: Self-pay | Admitting: *Deleted

## 2021-03-21 ENCOUNTER — Telehealth: Payer: Self-pay | Admitting: Internal Medicine

## 2021-03-21 MED ORDER — PAXLOVID (300/100) 20 X 150 MG & 10 X 100MG PO TBPK
ORAL_TABLET | ORAL | 0 refills | Status: DC
  Filled 2021-03-21: qty 30, 5d supply, fill #0

## 2021-03-21 MED ORDER — PAXLOVID (300/100) 20 X 150 MG & 10 X 100MG PO TBPK: ORAL_TABLET | ORAL | 0 refills | Status: DC

## 2021-03-21 NOTE — Telephone Encounter (Signed)
Perfect. Thank you for doing that.

## 2021-03-21 NOTE — Telephone Encounter (Signed)
Call from pt's wife - stated Paxlovid was prescribed  and sent to [facility] but they do not have the medication. Wife wants to know if rx can be sent to [facility] pharmacy? Thanks

## 2021-03-21 NOTE — Telephone Encounter (Signed)
Spoke with Tyler Bridges wife Tyler Bridges. She has been sick with COVID-19, Tyler Bridges came down with a cough yesterday and she tested him today, he was positive.  He is no vaccinated and has comorbidities. Interested in PAXLOVID.  He does appear to be a good candidate, I reviewed instructions to take medication twice daily for 5 days.

## 2021-06-06 ENCOUNTER — Telehealth: Payer: Self-pay | Admitting: *Deleted

## 2021-06-06 ENCOUNTER — Other Ambulatory Visit (HOSPITAL_COMMUNITY): Payer: Self-pay

## 2021-06-06 DIAGNOSIS — K2100 Gastro-esophageal reflux disease with esophagitis, without bleeding: Secondary | ICD-10-CM

## 2021-06-06 MED ORDER — PANTOPRAZOLE SODIUM 40 MG PO TBEC
40.0000 mg | DELAYED_RELEASE_TABLET | Freq: Two times a day (BID) | ORAL | 3 refills | Status: DC
  Filled 2021-06-06: qty 180, 90d supply, fill #0
  Filled 2021-08-26 (×2): qty 180, 90d supply, fill #1
  Filled 2021-12-06: qty 180, 90d supply, fill #2
  Filled 2022-03-06: qty 180, 90d supply, fill #3

## 2021-06-06 NOTE — Telephone Encounter (Signed)
I am sorry to hear that. I have sent a new Rx for bid dosing.

## 2021-06-06 NOTE — Telephone Encounter (Signed)
Patient's wife called in with patient. States patient has been taking Protonix 40 mg 1 tab BID "forever." Last Rx was sent for daily dosing and patient is having "horrible heartburn." They are requesting new Rx for BID dosing sent to [facility]. All other pharmacies removed from list.

## 2021-06-20 ENCOUNTER — Encounter: Payer: Self-pay | Admitting: Student in an Organized Health Care Education/Training Program

## 2021-06-20 ENCOUNTER — Ambulatory Visit (INDEPENDENT_AMBULATORY_CARE_PROVIDER_SITE_OTHER): Payer: PPO | Admitting: Student in an Organized Health Care Education/Training Program

## 2021-06-20 ENCOUNTER — Other Ambulatory Visit (HOSPITAL_COMMUNITY): Payer: Self-pay

## 2021-06-20 VITALS — BP 160/80 | HR 69 | Temp 98.0°F | Ht 68.0 in | Wt 206.0 lb

## 2021-06-20 DIAGNOSIS — E1140 Type 2 diabetes mellitus with diabetic neuropathy, unspecified: Secondary | ICD-10-CM | POA: Diagnosis not present

## 2021-06-20 DIAGNOSIS — N521 Erectile dysfunction due to diseases classified elsewhere: Secondary | ICD-10-CM

## 2021-06-20 DIAGNOSIS — E785 Hyperlipidemia, unspecified: Secondary | ICD-10-CM

## 2021-06-20 DIAGNOSIS — E1142 Type 2 diabetes mellitus with diabetic polyneuropathy: Secondary | ICD-10-CM | POA: Diagnosis not present

## 2021-06-20 DIAGNOSIS — I10 Essential (primary) hypertension: Secondary | ICD-10-CM

## 2021-06-20 DIAGNOSIS — N401 Enlarged prostate with lower urinary tract symptoms: Secondary | ICD-10-CM | POA: Diagnosis not present

## 2021-06-20 DIAGNOSIS — R351 Nocturia: Secondary | ICD-10-CM

## 2021-06-20 LAB — POCT GLYCOSYLATED HEMOGLOBIN (HGB A1C): Hemoglobin A1C: 8.5 % — AB (ref 4.0–5.6)

## 2021-06-20 LAB — GLUCOSE, CAPILLARY: Glucose-Capillary: 275 mg/dL — ABNORMAL HIGH (ref 70–99)

## 2021-06-20 MED ORDER — LISINOPRIL-HYDROCHLOROTHIAZIDE 20-25 MG PO TABS
1.0000 | ORAL_TABLET | Freq: Every day | ORAL | 3 refills | Status: DC
  Filled 2021-06-20: qty 90, 90d supply, fill #0

## 2021-06-20 MED ORDER — GABAPENTIN 100 MG PO CAPS
100.0000 mg | ORAL_CAPSULE | Freq: Every day | ORAL | 3 refills | Status: DC
Start: 2021-06-20 — End: 2023-08-10
  Filled 2021-06-20: qty 90, 90d supply, fill #0

## 2021-06-20 MED ORDER — METFORMIN HCL ER 500 MG PO TB24
500.0000 mg | ORAL_TABLET | Freq: Every day | ORAL | 3 refills | Status: DC
  Filled 2021-06-20 – 2021-10-24 (×2): qty 90, 90d supply, fill #0
  Filled 2021-12-06 – 2022-01-02 (×2): qty 90, 90d supply, fill #1

## 2021-06-20 MED ORDER — EMPAGLIFLOZIN 10 MG PO TABS
10.0000 mg | ORAL_TABLET | Freq: Every day | ORAL | 3 refills | Status: DC
Start: 2021-06-20 — End: 2023-08-10
  Filled 2021-06-20: qty 90, 90d supply, fill #0

## 2021-06-20 MED ORDER — TAMSULOSIN HCL 0.4 MG PO CAPS
0.4000 mg | ORAL_CAPSULE | Freq: Every day | ORAL | 3 refills | Status: DC
  Filled 2021-06-20: qty 90, 90d supply, fill #0
  Filled 2021-09-24: qty 90, 90d supply, fill #1
  Filled 2021-12-06: qty 90, 90d supply, fill #2
  Filled 2022-04-12: qty 90, 90d supply, fill #3

## 2021-06-20 NOTE — Assessment & Plan Note (Signed)
Worsening control with A1c of 8.5% today. He is not tolerating full dose metformin due to diarrhea, only able to tolerate 500mg  daily of XR formulation. Will add Jardiance 10mg  daily. We discussed side effects and precautions. Follow up in 3 months for recheck.

## 2021-06-20 NOTE — Progress Notes (Signed)
° °  Assessment and Plan:  See Encounters tab for problem-based medical decision making.   __________________________________________________________  HPI:   76 year old person her for follow up of diabetes and hypertension. He reports being a little less active this winter. No chest pain, dyspnea, or other complaint limiting his function. He has some low back and knee pain for which he uses advil as needed. Reports some trouble with eating sweets on a daily basis. He denies any fevers or illness. We went over his medications, he had diarrhea with higher dose of metformin so can only tolerate 1 tablet per day. Otherwise mood is good, home life also very supportive with lots of family around.  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    Priority: Medium    Obesity (BMI 30.0-34.9) 02/19/2015    Priority: Medium    Benign prostatic hyperplasia with nocturia 11/29/2007    Priority: Medium    Hyperlipidemia 02/26/2006    Priority: Medium    Seasonal allergic rhinitis 04/09/2015    Priority: Low   Chronic insomnia 04/09/2015    Priority: Low   Aortic atherosclerosis (HCC) 01/14/2015    Priority: Low   Healthcare maintenance 08/03/2014    Priority: Low   Chronic low back pain 09/05/2007    Priority: Low   Osteoarthritis of left knee 11/02/2006    Priority: Low   Gastroesophageal reflux disease 02/26/2006    Priority: Low    Medications: Reconciled today in Epic __________________________________________________________  Physical Exam:  Vital Signs: Vitals:   06/20/21 1054 06/20/21 1055  BP: (!) 169/71 (!) 160/80  Pulse: 68 69  Temp: 98 F (36.7 C)   TempSrc: Oral    SpO2: 98%   Weight: 206 lb (93.4 kg)   Height: 5\' 8"  (1.727 m)     Gen: Well appearing, NAD Neck: No cervical LAD, No thyromegaly or nodules CV: RRR, no murmurs Pulm: Normal effort, CTA throughout, no wheezing Ext: Warm, no edema, normal joints Skin: No atypical appearing moles. No rashes

## 2021-06-20 NOTE — Assessment & Plan Note (Signed)
Stable symptoms, did not fill tamsulosin, some kind of miscommunication. We clarified today, will restart flomax 0.4mg  daily.

## 2021-06-20 NOTE — Assessment & Plan Note (Signed)
New symptom of neuropathic-like pain in both feet bothering him mostly at night. Foot exam normal today, good pulses, and normal monofilament. Maybe early diabetic neuropathy given recent hyperglycemia. Will start gabapentin 100mg  night, gave precautions about sedation.

## 2021-06-20 NOTE — Assessment & Plan Note (Signed)
Hypertension is uncontrolled today. He reports good adherence with lisinopril 40mg  daily. Will switch to combo lisinopril-HCTZ 20-25, take one tablet daily. Follow up BP and BMP in 3 months.

## 2021-06-20 NOTE — Patient Instructions (Signed)
Today we addressed several issues with your health:  Your diabetes is not as well controlled as last time. I have added a medicine called Jardiance, take one tablet daily. This is in addition to taking Metformin one tablet daily.  Your blood pressure was high today. I have switched your medicine from Lisinopril alone, to a combination tablet with both Lisinopril and Hydrochlorathiazide, take one tablet daily.  Your foot pain sounds like neuropathy which can be a complication of diabetes. Because it is bothering you at night, I have prescribed gabapentin, take one tablet before bedtime. This medicine can make you drowsy.  For your cholesterol I have prescribed Rosuvastatin, please pick this up at the pharmacy and take it once daily.  This is a lot of changes for one visit. Please look over all your medicines and call me if you have any questions or confusion.

## 2021-06-20 NOTE — Assessment & Plan Note (Signed)
Poor adherence to rosuvastatin since prescribed in October, but no clear side effects. We decided to try taking the rosuvastatin again. Will recheck lipids next visit if he can tolerate the statin consistently in the interval.

## 2021-08-08 ENCOUNTER — Telehealth: Payer: Self-pay | Admitting: *Deleted

## 2021-08-08 NOTE — Chronic Care Management (AMB) (Signed)
?  Care Management  ? ?Outreach Note ? ?08/08/2021 ?Name: DANON PONTHIEUX MRN: [id number] DOB: [birth year] ? ?Referred by: Axel Filler, MD ?Reason for referral : Care Coordination (Initial outreach to schedule with [facility] ) ? ? ?An unsuccessful telephone outreach was attempted today. The patient was referred to the case management team for assistance with care management and care coordination.  ? ?Follow Up Plan:  ?A HIPAA compliant phone message was left for the patient providing contact information and requesting a return call.  ?The care management team will reach out to the patient again over the next 7 days.  ?If patient returns call to provider office, please advise to call [facility]* at [phone].* ? ?Laverda Sorenson  ?Care Guide, Embedded Care Coordination ?[place]  Care Management  ?Direct Dial: [phone] ? ?

## 2021-08-10 NOTE — Chronic Care Management (AMB) (Signed)
?  Care Management  ? ?Outreach Note ? ?08/10/2021 ?Name: Tyler Bridges MRN: [id number] DOB: February 20, 1946 ? ?Referred by: Tyson Alias, MD ?Reason for referral : Care Coordination (Initial outreach to schedule with [facility] ) ? ? ?An unsuccessful telephone outreach was attempted today. The patient was referred to the case management team for assistance with care management and care coordination.  ? ?Follow Up Plan:  ?A HIPAA compliant phone message was left for the patient providing contact information and requesting a return call.  ?The care management team will reach out to the patient again over the next 14 days.  ?If patient returns call to provider office, please advise to call Embedded Care Management Care Guide Misty Stanley * at [phone].* ? ?Gwenevere Ghazi  ?Care Guide, Embedded Care Coordination ?[place]  Care Management  ?Direct Dial: [phone] ? ?

## 2021-08-23 NOTE — Chronic Care Management (AMB) (Signed)
?  Care Management  ? ?Outreach Note ? ?08/23/2021 ?Name: Tyler Bridges MRN: [id number] DOB: 1945/08/23 ? ? ?Reason for referral : Care Coordination (Initial outreach to schedule with RNCM ) ? ? ?Third unsuccessful telephone outreach was attempted today. The patient was referred to the case management team for assistance with care management and care coordination. The patient's primary care provider has been notified of our unsuccessful attempts to make or maintain contact with the patient. The care management team is pleased to engage with this patient at any time in the future should he/she be interested in assistance from the care management team.  ? ?Follow Up Plan:  ?If patient returns call to provider office, please advise to call [place]* at [phone].* ? ?Laverda Sorenson  ?Care Guide, Embedded Care Coordination ?[place]  Care Management  ?Direct Dial: [phone] ? ?

## 2021-08-26 ENCOUNTER — Other Ambulatory Visit (HOSPITAL_COMMUNITY): Payer: Self-pay

## 2021-09-26 ENCOUNTER — Other Ambulatory Visit (HOSPITAL_COMMUNITY): Payer: Self-pay

## 2021-10-06 ENCOUNTER — Other Ambulatory Visit (HOSPITAL_COMMUNITY): Payer: Self-pay

## 2021-10-06 MED ORDER — OZEMPIC (0.25 OR 0.5 MG/DOSE) 2 MG/3ML ~~LOC~~ SOPN
0.5000 mg | PEN_INJECTOR | SUBCUTANEOUS | 2 refills | Status: DC
  Filled 2021-10-06: qty 3, 28d supply, fill #0

## 2021-10-17 ENCOUNTER — Other Ambulatory Visit (HOSPITAL_COMMUNITY): Payer: Self-pay

## 2021-10-24 ENCOUNTER — Other Ambulatory Visit (HOSPITAL_COMMUNITY): Payer: Self-pay

## 2021-10-26 ENCOUNTER — Other Ambulatory Visit (HOSPITAL_COMMUNITY): Payer: Self-pay

## 2021-11-02 ENCOUNTER — Other Ambulatory Visit (HOSPITAL_COMMUNITY): Payer: Self-pay

## 2021-11-02 MED ORDER — OXYCODONE HCL 5 MG PO TABS
5.0000 mg | ORAL_TABLET | Freq: Four times a day (QID) | ORAL | 0 refills | Status: DC | PRN
  Filled 2021-11-02: qty 10, 3d supply, fill #0

## 2021-11-02 MED ORDER — FARXIGA 5 MG PO TABS
5.0000 mg | ORAL_TABLET | Freq: Every day | ORAL | 5 refills | Status: DC
  Filled 2021-11-02: qty 30, 30d supply, fill #0

## 2021-11-02 MED ORDER — OXYCODONE HCL 5 MG PO TABS
5.0000 mg | ORAL_TABLET | Freq: Four times a day (QID) | ORAL | 0 refills | Status: DC | PRN
  Filled 2021-11-02: qty 10, 10d supply, fill #0
  Filled 2021-11-03 (×2): qty 10, 3d supply, fill #0

## 2021-11-03 ENCOUNTER — Other Ambulatory Visit (HOSPITAL_COMMUNITY): Payer: Self-pay

## 2021-11-03 ENCOUNTER — Other Ambulatory Visit: Payer: Self-pay | Admitting: Urology

## 2021-11-03 NOTE — Progress Notes (Signed)
Left message for patient to call for procedural instructions.

## 2021-11-04 ENCOUNTER — Encounter (HOSPITAL_BASED_OUTPATIENT_CLINIC_OR_DEPARTMENT_OTHER): Payer: Self-pay | Admitting: Urology

## 2021-11-04 NOTE — Progress Notes (Signed)
Patient pre-op phone call complete. Allergies, medications, and past medical history verified. Patient procedure date and arrival time confirmed. Patient advised to stop vitamins and not to take any diabetic medications the day of the procedure. Patient not to have Pepto Bismol or NSAIDS 48 hours prior to procedure and no Aspirin products 72 hours prior. Patient to take a laxative 24 hours prior to procedure. Patient can have clear liquids until 0400 and should be NPO at midnight. Patient denies any recent history of chest pain or COVID. Patient denies any tobacco use. Driver secured.

## 2021-11-04 NOTE — Progress Notes (Signed)
Left message for patient to return phone call for pre-op instructions.

## 2021-11-04 NOTE — Progress Notes (Signed)
Left voicemail for patient to arrive at 0815 rather than 0600 due to schedule change.

## 2021-11-05 ENCOUNTER — Other Ambulatory Visit: Payer: Self-pay | Admitting: Urology

## 2021-11-05 ENCOUNTER — Other Ambulatory Visit (HOSPITAL_COMMUNITY): Payer: Self-pay

## 2021-11-05 MED ORDER — OXYCODONE HCL 5 MG PO TABS
5.0000 mg | ORAL_TABLET | Freq: Four times a day (QID) | ORAL | 0 refills | Status: DC | PRN
  Filled 2021-11-05: qty 30, 8d supply, fill #0
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
## 2021-11-07 ENCOUNTER — Ambulatory Visit (HOSPITAL_COMMUNITY): Payer: PPO

## 2021-11-07 ENCOUNTER — Ambulatory Visit (HOSPITAL_BASED_OUTPATIENT_CLINIC_OR_DEPARTMENT_OTHER)
Admission: RE | Admit: 2021-11-07 | Discharge: 2021-11-07 | Disposition: A | Discharge status: Home / Self Care | Payer: PPO | Attending: Urology | Admitting: Urology

## 2021-11-07 ENCOUNTER — Other Ambulatory Visit: Payer: Self-pay

## 2021-11-07 ENCOUNTER — Encounter (HOSPITAL_BASED_OUTPATIENT_CLINIC_OR_DEPARTMENT_OTHER)
Admission: RE | Disposition: A | Discharge status: Home / Self Care | Payer: Self-pay | Source: Home / Self Care | Attending: Urology

## 2021-11-07 ENCOUNTER — Encounter (HOSPITAL_BASED_OUTPATIENT_CLINIC_OR_DEPARTMENT_OTHER): Payer: Self-pay | Admitting: Urology

## 2021-11-07 DIAGNOSIS — N202 Calculus of kidney with calculus of ureter: Secondary | ICD-10-CM | POA: Diagnosis present

## 2021-11-07 DIAGNOSIS — Z87891 Personal history of nicotine dependence: Secondary | ICD-10-CM | POA: Diagnosis not present

## 2021-11-07 DIAGNOSIS — Z7984 Long term (current) use of oral hypoglycemic drugs: Secondary | ICD-10-CM | POA: Diagnosis not present

## 2021-11-07 DIAGNOSIS — Z79899 Other long term (current) drug therapy: Secondary | ICD-10-CM | POA: Diagnosis not present

## 2021-11-07 DIAGNOSIS — E119 Type 2 diabetes mellitus without complications: Secondary | ICD-10-CM | POA: Insufficient documentation

## 2021-11-07 HISTORY — PX: EXTRACORPOREAL SHOCK WAVE LITHOTRIPSY: SHX1557

## 2021-11-07 LAB — GLUCOSE, CAPILLARY
Glucose-Capillary: 206 mg/dL — ABNORMAL HIGH (ref 70–99)
Glucose-Capillary: 206 mg/dL — ABNORMAL HIGH (ref 70–99)

## 2021-11-07 SURGERY — LITHOTRIPSY, ESWL
Anesthesia: LOCAL | Laterality: Right

## 2021-11-07 MED ORDER — DIPHENHYDRAMINE HCL 25 MG PO CAPS
25.0000 mg | ORAL_CAPSULE | ORAL | Status: AC
  Administered 2021-11-07: 25 mg via ORAL

## 2021-11-07 MED ORDER — DIAZEPAM 5 MG PO TABS
ORAL_TABLET | ORAL | Status: AC
  Filled 2021-11-07: qty 2

## 2021-11-07 MED ORDER — CIPROFLOXACIN HCL 500 MG PO TABS
ORAL_TABLET | ORAL | Status: AC
  Filled 2021-11-07: qty 1

## 2021-11-07 MED ORDER — DIPHENHYDRAMINE HCL 25 MG PO CAPS
ORAL_CAPSULE | ORAL | Status: AC
  Filled 2021-11-07: qty 1

## 2021-11-07 MED ORDER — CIPROFLOXACIN HCL 500 MG PO TABS
500.0000 mg | ORAL_TABLET | ORAL | Status: AC
  Administered 2021-11-07: 500 mg via ORAL

## 2021-11-07 MED ORDER — SODIUM CHLORIDE 0.9% FLUSH: 3.0000 mL | Freq: Two times a day (BID) | INTRAVENOUS | Status: DC

## 2021-11-07 MED ORDER — DIAZEPAM 5 MG PO TABS
10.0000 mg | ORAL_TABLET | ORAL | Status: AC
  Administered 2021-11-07: 10 mg via ORAL

## 2021-11-07 MED ORDER — SODIUM CHLORIDE 0.9 % IV SOLN: INTRAVENOUS | Status: DC

## 2021-11-07 NOTE — Op Note (Addendum)
See [facility]SC note  The stone could not be visualized and the case was canceled.

## 2021-11-07 NOTE — Discharge Instructions (Addendum)
  Post Anesthesia Home Care Instructions  Activity: Get plenty of rest for the remainder of the day. A responsible individual must stay with you for 24 hours following the procedure.  For the next 24 hours, DO NOT: -Drive a car -Operate machinery -Drink alcoholic beverages -Take any medication unless instructed by your physician -Make any legal decisions or sign important papers.  Meals: Start with liquid foods such as gelatin or soup. Progress to regular foods as tolerated. Avoid greasy, spicy, heavy foods. If nausea and/or vomiting occur, drink only clear liquids until the nausea and/or vomiting subsides. Call your physician if vomiting continues.  

## 2021-11-07 NOTE — Interval H&P Note (Signed)
History and Physical Interval Note: No change in stone.   11/07/2021 10:31 AM  Tyler Bridges  has presented today for surgery, with the diagnosis of ureteral calculus.  The various methods of treatment have been discussed with the patient and family. After consideration of risks, benefits and other options for treatment, the patient has consented to  Procedure(s): EXTRACORPOREAL SHOCK WAVE LITHOTRIPSY (ESWL) (Right) as a surgical intervention.  The patient's history has been reviewed, patient examined, no change in status, stable for surgery.  I have reviewed the patient's chart and labs.  Questions were answered to the patient's satisfaction.     Bjorn Pippin

## 2021-11-09 ENCOUNTER — Encounter (HOSPITAL_BASED_OUTPATIENT_CLINIC_OR_DEPARTMENT_OTHER): Payer: Self-pay | Admitting: Urology

## 2021-12-06 ENCOUNTER — Other Ambulatory Visit (HOSPITAL_COMMUNITY): Payer: Self-pay

## 2021-12-07 ENCOUNTER — Other Ambulatory Visit (HOSPITAL_COMMUNITY): Payer: Self-pay

## 2021-12-07 MED ORDER — METFORMIN HCL ER 500 MG PO TB24
500.0000 mg | ORAL_TABLET | Freq: Every day | ORAL | 3 refills | Status: DC
  Filled 2021-12-07: qty 90, 90d supply, fill #0

## 2022-01-02 ENCOUNTER — Other Ambulatory Visit (HOSPITAL_COMMUNITY): Payer: Self-pay

## 2022-01-03 ENCOUNTER — Ambulatory Visit (INDEPENDENT_AMBULATORY_CARE_PROVIDER_SITE_OTHER): Payer: PPO

## 2022-01-03 ENCOUNTER — Other Ambulatory Visit (HOSPITAL_COMMUNITY): Payer: Self-pay

## 2022-01-03 ENCOUNTER — Ambulatory Visit: Payer: Self-pay

## 2022-01-03 ENCOUNTER — Encounter: Payer: Self-pay | Admitting: Orthopaedic Surgery

## 2022-01-03 ENCOUNTER — Ambulatory Visit: Payer: PPO | Admitting: Orthopaedic Surgery

## 2022-01-03 VITALS — Ht 68.0 in | Wt 192.0 lb

## 2022-01-03 DIAGNOSIS — M170 Bilateral primary osteoarthritis of knee: Secondary | ICD-10-CM | POA: Insufficient documentation

## 2022-01-03 DIAGNOSIS — M25561 Pain in right knee: Secondary | ICD-10-CM

## 2022-01-03 DIAGNOSIS — M25562 Pain in left knee: Secondary | ICD-10-CM | POA: Diagnosis not present

## 2022-01-03 MED ORDER — METHYLPREDNISOLONE ACETATE 40 MG/ML IJ SUSP
80.0000 mg | INTRAMUSCULAR | Status: AC | PRN
  Administered 2022-01-03: 80 mg via INTRA_ARTICULAR

## 2022-01-03 MED ORDER — LIDOCAINE HCL 1 % IJ SOLN
2.0000 mL | INTRAMUSCULAR | Status: AC | PRN
  Administered 2022-01-03: 2 mL

## 2022-01-03 MED ORDER — BUPIVACAINE HCL 0.25 % IJ SOLN
2.0000 mL | INTRAMUSCULAR | Status: AC | PRN
  Administered 2022-01-03: 2 mL via INTRA_ARTICULAR
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
## 2022-01-04 ENCOUNTER — Other Ambulatory Visit (HOSPITAL_COMMUNITY): Payer: Self-pay

## 2022-01-04 MED ORDER — METFORMIN HCL ER 500 MG PO TB24
1000.0000 mg | ORAL_TABLET | Freq: Every day | ORAL | 3 refills | Status: DC
  Filled 2022-01-04 – 2022-02-01 (×3): qty 180, 90d supply, fill #0

## 2022-01-12 ENCOUNTER — Other Ambulatory Visit (HOSPITAL_COMMUNITY): Payer: Self-pay

## 2022-01-13 ENCOUNTER — Other Ambulatory Visit (HOSPITAL_COMMUNITY): Payer: Self-pay

## 2022-01-25 ENCOUNTER — Encounter: Payer: Self-pay | Admitting: Orthopaedic Surgery

## 2022-01-25 ENCOUNTER — Ambulatory Visit: Payer: PPO | Admitting: Orthopaedic Surgery

## 2022-01-25 DIAGNOSIS — M1711 Unilateral primary osteoarthritis, right knee: Secondary | ICD-10-CM

## 2022-01-25 DIAGNOSIS — M170 Bilateral primary osteoarthritis of knee: Secondary | ICD-10-CM

## 2022-01-25 MED ORDER — METHYLPREDNISOLONE ACETATE 40 MG/ML IJ SUSP
80.0000 mg | INTRAMUSCULAR | Status: AC | PRN
  Administered 2022-01-25: 80 mg via INTRA_ARTICULAR

## 2022-01-25 MED ORDER — BUPIVACAINE HCL 0.25 % IJ SOLN
2.0000 mL | INTRAMUSCULAR | Status: AC | PRN
  Administered 2022-01-25: 2 mL via INTRA_ARTICULAR

## 2022-01-25 MED ORDER — LIDOCAINE HCL 1 % IJ SOLN
2.0000 mL | INTRAMUSCULAR | Status: AC | PRN
  Administered 2022-01-25: 2 mL
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
## 2022-02-01 ENCOUNTER — Other Ambulatory Visit (HOSPITAL_COMMUNITY): Payer: Self-pay

## 2022-02-03 ENCOUNTER — Other Ambulatory Visit (HOSPITAL_COMMUNITY): Payer: Self-pay

## 2022-02-03 MED ORDER — METFORMIN HCL ER 500 MG PO TB24
2000.0000 mg | ORAL_TABLET | Freq: Every day | ORAL | 3 refills | Status: DC
  Filled 2022-02-03: qty 360, 90d supply, fill #0
  Filled 2022-02-07: qty 60, 15d supply, fill #0
  Filled 2022-02-20: qty 360, 90d supply, fill #1
  Filled 2022-06-11: qty 360, 90d supply, fill #2
  Filled 2022-10-12: qty 360, 90d supply, fill #3
  Filled 2023-01-09: qty 300, 75d supply, fill #4

## 2022-02-07 ENCOUNTER — Other Ambulatory Visit (HOSPITAL_COMMUNITY): Payer: Self-pay

## 2022-02-20 ENCOUNTER — Other Ambulatory Visit (HOSPITAL_COMMUNITY): Payer: Self-pay

## 2022-03-06 ENCOUNTER — Other Ambulatory Visit (HOSPITAL_COMMUNITY): Payer: Self-pay

## 2022-03-06 ENCOUNTER — Other Ambulatory Visit: Payer: Self-pay | Admitting: Student in an Organized Health Care Education/Training Program

## 2022-03-06 MED ORDER — ROSUVASTATIN CALCIUM 20 MG PO TABS
20.0000 mg | ORAL_TABLET | Freq: Every day | ORAL | 3 refills | Status: DC
  Filled 2022-03-06: qty 90, 90d supply, fill #0
  Filled 2022-06-09: qty 90, 90d supply, fill #1
  Filled 2022-09-04: qty 90, 90d supply, fill #2
  Filled 2022-12-04: qty 90, 90d supply, fill #3

## 2022-04-12 ENCOUNTER — Other Ambulatory Visit (HOSPITAL_COMMUNITY): Payer: Self-pay

## 2022-06-09 ENCOUNTER — Other Ambulatory Visit (HOSPITAL_COMMUNITY): Payer: Self-pay

## 2022-06-09 MED ORDER — PANTOPRAZOLE SODIUM 40 MG PO TBEC
40.0000 mg | DELAYED_RELEASE_TABLET | Freq: Two times a day (BID) | ORAL | 3 refills | Status: DC
  Filled 2022-06-09: qty 180, 90d supply, fill #0
  Filled 2022-09-04: qty 180, 90d supply, fill #1
  Filled 2022-12-04: qty 180, 90d supply, fill #2
  Filled 2023-03-26: qty 180, 90d supply, fill #3

## 2022-07-17 ENCOUNTER — Other Ambulatory Visit (HOSPITAL_COMMUNITY): Payer: Self-pay

## 2022-07-17 ENCOUNTER — Other Ambulatory Visit: Payer: Self-pay | Admitting: Student in an Organized Health Care Education/Training Program

## 2022-07-17 DIAGNOSIS — N401 Enlarged prostate with lower urinary tract symptoms: Secondary | ICD-10-CM

## 2022-07-17 MED ORDER — TAMSULOSIN HCL 0.4 MG PO CAPS
0.4000 mg | ORAL_CAPSULE | Freq: Every day | ORAL | 3 refills | Status: DC
  Filled 2022-07-17: qty 90, 90d supply, fill #0
  Filled 2022-10-12: qty 90, 90d supply, fill #1
  Filled 2023-01-09: qty 90, 90d supply, fill #2

## 2022-08-16 ENCOUNTER — Ambulatory Visit: Payer: PPO | Admitting: Orthopaedic Surgery

## 2022-09-04 ENCOUNTER — Other Ambulatory Visit (HOSPITAL_COMMUNITY): Payer: Self-pay

## 2022-10-12 ENCOUNTER — Other Ambulatory Visit (HOSPITAL_COMMUNITY): Payer: Self-pay

## 2022-12-05 ENCOUNTER — Other Ambulatory Visit (HOSPITAL_COMMUNITY): Payer: Self-pay

## 2023-01-09 ENCOUNTER — Other Ambulatory Visit (HOSPITAL_COMMUNITY): Payer: Self-pay

## 2023-03-06 ENCOUNTER — Other Ambulatory Visit (HOSPITAL_COMMUNITY): Payer: Self-pay

## 2023-03-26 ENCOUNTER — Other Ambulatory Visit (HOSPITAL_COMMUNITY): Payer: Self-pay

## 2023-03-28 ENCOUNTER — Other Ambulatory Visit (HOSPITAL_COMMUNITY): Payer: Self-pay

## 2023-03-28 MED ORDER — EYSUVIS 0.25 % OP SUSP
1.0000 [drp] | Freq: Three times a day (TID) | OPHTHALMIC | 1 refills | Status: DC
  Filled 2023-03-28: qty 8.3, 30d supply, fill #0

## 2023-04-02 ENCOUNTER — Other Ambulatory Visit (HOSPITAL_COMMUNITY): Payer: Self-pay

## 2023-04-03 ENCOUNTER — Other Ambulatory Visit (HOSPITAL_COMMUNITY): Payer: Self-pay

## 2023-04-03 ENCOUNTER — Other Ambulatory Visit (HOSPITAL_BASED_OUTPATIENT_CLINIC_OR_DEPARTMENT_OTHER): Payer: Self-pay

## 2023-04-03 ENCOUNTER — Other Ambulatory Visit: Payer: Self-pay

## 2023-04-03 MED ORDER — ONETOUCH ULTRA TEST VI STRP
ORAL_STRIP | 3 refills | Status: DC
  Filled 2023-04-03: qty 300, 75d supply, fill #0

## 2023-04-04 ENCOUNTER — Other Ambulatory Visit (HOSPITAL_COMMUNITY): Payer: Self-pay
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
## 2023-05-10 DIAGNOSIS — H11043 Peripheral pterygium, stationary, bilateral: Secondary | ICD-10-CM | POA: Diagnosis not present

## 2023-05-10 DIAGNOSIS — R519 Headache, unspecified: Secondary | ICD-10-CM | POA: Diagnosis not present

## 2023-05-10 DIAGNOSIS — H43811 Vitreous degeneration, right eye: Secondary | ICD-10-CM | POA: Diagnosis not present

## 2023-06-22 ENCOUNTER — Other Ambulatory Visit (HOSPITAL_COMMUNITY): Payer: Self-pay

## 2023-06-25 MED ORDER — PANTOPRAZOLE SODIUM 40 MG PO TBEC
40.0000 mg | DELAYED_RELEASE_TABLET | Freq: Two times a day (BID) | ORAL | 0 refills | Status: DC
  Filled 2023-06-25: qty 180, 90d supply, fill #0

## 2023-06-26 ENCOUNTER — Other Ambulatory Visit (HOSPITAL_COMMUNITY): Payer: Self-pay

## 2023-08-08 ENCOUNTER — Ambulatory Visit: Admitting: Student in an Organized Health Care Education/Training Program

## 2023-08-10 ENCOUNTER — Ambulatory Visit (INDEPENDENT_AMBULATORY_CARE_PROVIDER_SITE_OTHER): Admitting: Student in an Organized Health Care Education/Training Program

## 2023-08-10 ENCOUNTER — Other Ambulatory Visit (HOSPITAL_BASED_OUTPATIENT_CLINIC_OR_DEPARTMENT_OTHER): Payer: Self-pay

## 2023-08-10 VITALS — BP 124/78 | HR 72 | Temp 98.0°F | Ht 66.54 in | Wt 193.0 lb

## 2023-08-10 DIAGNOSIS — M7581 Other shoulder lesions, right shoulder: Secondary | ICD-10-CM | POA: Insufficient documentation

## 2023-08-10 DIAGNOSIS — I10 Essential (primary) hypertension: Secondary | ICD-10-CM

## 2023-08-10 DIAGNOSIS — E1142 Type 2 diabetes mellitus with diabetic polyneuropathy: Secondary | ICD-10-CM

## 2023-08-10 DIAGNOSIS — Z7984 Long term (current) use of oral hypoglycemic drugs: Secondary | ICD-10-CM

## 2023-08-10 LAB — GLUCOSE, POCT (MANUAL RESULT ENTRY): POC Glucose: 243 mg/dL — AB (ref 70–99)

## 2023-08-10 LAB — COMPREHENSIVE METABOLIC PANEL WITH GFR
ALT: 13 U/L (ref 0–53)
AST: 14 U/L (ref 0–37)
Albumin: 4.4 g/dL (ref 3.5–5.2)
Alkaline Phosphatase: 82 U/L (ref 39–117)
BUN: 12 mg/dL (ref 6–23)
CO2: 24 meq/L (ref 19–32)
Calcium: 9.2 mg/dL (ref 8.4–10.5)
Chloride: 99 meq/L (ref 96–112)
Creatinine, Ser: 0.81 mg/dL (ref 0.40–1.50)
GFR: 84.05 mL/min (ref >60.00)
Glucose, Bld: 292 mg/dL — ABNORMAL HIGH (ref 70–99)
Potassium: 4.3 meq/L (ref 3.5–5.1)
Sodium: 134 meq/L — ABNORMAL LOW (ref 135–145)
Total Bilirubin: 0.5 mg/dL (ref 0.2–1.2)
Total Protein: 7.4 g/dL (ref 6.0–8.3)

## 2023-08-10 LAB — POCT GLYCOSYLATED HEMOGLOBIN (HGB A1C): Hemoglobin A1C: 9.3 % — AB (ref 4.0–5.6)

## 2023-08-10 LAB — LIPID PANEL
Cholesterol: 155 mg/dL (ref 0–200)
HDL: 32.9 mg/dL — ABNORMAL LOW (ref >39.00)
LDL Cholesterol: 62 mg/dL (ref 0–99)
NonHDL: 121.93
Total CHOL/HDL Ratio: 5
Triglycerides: 300 mg/dL — ABNORMAL HIGH (ref 0.0–149.0)
VLDL: 60 mg/dL — ABNORMAL HIGH (ref 0.0–40.0)

## 2023-08-10 LAB — MICROALBUMIN / CREATININE URINE RATIO
Creatinine,U: 109 mg/dL
Microalb Creat Ratio: 12.5 mg/g (ref 0.0–30.0)
Microalb, Ur: 1.4 mg/dL (ref 0.0–1.9)

## 2023-08-10 MED ORDER — EMPAGLIFLOZIN 10 MG PO TABS
10.0000 mg | ORAL_TABLET | Freq: Every day | ORAL | 3 refills | Status: AC
  Filled 2023-08-10: qty 30, 30d supply, fill #0
  Filled 2023-08-10: qty 90, 90d supply, fill #0
  Filled 2023-09-13: qty 30, 30d supply, fill #1
  Filled 2023-10-16: qty 30, 30d supply, fill #2
  Filled 2023-11-12: qty 30, 30d supply, fill #3
  Filled 2024-03-10: qty 30, 30d supply, fill #4

## 2023-08-10 NOTE — Assessment & Plan Note (Signed)
 Exam and history are consistent with a right rotator cuff tendinitis, most likely the subscapularis and supraspinatus tendon on exam.  I did point-of-care ultrasound which showed some long head biceps tendinitis as well, there was a small glenohumeral joint effusion, and some irregularities of the humeral head consistent with osteoarthritis.  We talked about the natural course of both tendinitis and osteoarthritis.  Will continue with supportive care for now.  We talked about doing a steroid injection into the subacromial space first, which we can arrange for next week.

## 2023-08-10 NOTE — Assessment & Plan Note (Signed)
 Chronic and uncontrolled.  A1c today 9.3%.  Not currently on medications due to side effects from using metformin in the past.  Patient also not interested in GLP-1 agonist because of history of bowel obstruction and GI side effects.  We talked about starting SGLT2 inhibitor.  I recommended Jardiance which she has been prescribed in the past.  Will start at 10 mg daily.  I suspect we may need a second agent, but will start here.  Check labs and microalbumin today.  We talked about the risks of complications in the future if we do not get better control of the diabetes.

## 2023-08-10 NOTE — Assessment & Plan Note (Signed)
 Chronic and stable.  Blood pressure is acceptable today 124/78 without medications.  Patient would like to limit his medication burden so not interested in starting an ARB right now.

## 2023-08-10 NOTE — Progress Notes (Signed)
 Established Patient Office Visit  Subjective   Patient ID: Tyler Bridges, male    DOB: [birth year]  Age: 78 y.o. MRN: [id number]  Chief Complaint  Patient presents with   Shoulder Pain    Right shoulder pain since last Friday, Did pick up 60lbs bag of dirt, heard a pop and played with grandson right after. Wife did have left over muscle relaxer that she did provide the patient to take, muscle relaxer did seem to help.  Patient would like to discuss bilateral knee pain and possible setting up appointment for Cortizone shots.     HPI  78 year old person here for management of hypertension and diabetes.  I last saw the patient a little over 79 years ago.  He was under the care of a physician in [place] for a while.  But they stopped taking his insurance.  He was having some GI side effects and so he stopped his diabetes and hypertension medications about 3 months ago.  He started taking natural supplements to try to control these chronic issues with fewer side effects.  He has a good quality of life, functional and all activities of daily living.  Still likes to do a lot of work around the house, fish, play golf, and play with his grandchildren.  He has some pain in his right shoulder on the posterior side after playing baseball.  This is bothersome at night.  No trauma.  No fevers or chills.  No swelling.    Objective:     BP 124/78   Pulse 72   Temp 98 F (36.7 C) (Temporal)   Ht 5' 6.54" (1.69 m)   Wt 193 lb (87.5 kg)   SpO2 97%   BMI 30.65 kg/m    Physical Exam  Gen: Well-appearing man, no distress Neck: Normal thyroid, no adenopathy Heart: Regular, no murmur Lungs: Unlabored, clear throughout Ext: Right shoulder has a scar from remote surgery, he has no pain with passive range of motion, no weakness, tenderness with internal rotation and empty can test. Neuro: Alert, conversational, full strength upper and lower extremities, normal gait, normal get up and go     Assessment & Plan:   Problem List Items Addressed This Visit       High   Type 2 diabetes mellitus (HCC) - Primary (Chronic)   Chronic and uncontrolled.  A1c today 9.3%.  Not currently on medications due to side effects from using metformin in the past.  Patient also not interested in GLP-1 agonist because of history of bowel obstruction and GI side effects.  We talked about starting SGLT2 inhibitor.  I recommended Jardiance which she has been prescribed in the past.  Will start at 10 mg daily.  I suspect we may need a second agent, but will start here.  Check labs and microalbumin today.  We talked about the risks of complications in the future if we do not get better control of the diabetes.      Relevant Medications   empagliflozin (JARDIANCE) 10 MG TABS tablet   Other Relevant Orders   POCT glycosylated hemoglobin (Hb A1C) (Completed)   POCT glucose (manual entry) (Completed)   Comprehensive metabolic panel with GFR   Lipid panel   Microalbumin / creatinine urine ratio   Essential hypertension (Chronic)   Chronic and stable.  Blood pressure is acceptable today 124/78 without medications.  Patient would like to limit his medication burden so not interested in starting an ARB right now.  Unprioritized   Right rotator cuff tendinitis   Exam and history are consistent with a right rotator cuff tendinitis, most likely the subscapularis and supraspinatus tendon on exam.  I did point-of-care ultrasound which showed some long head biceps tendinitis as well, there was a small glenohumeral joint effusion, and some irregularities of the humeral head consistent with osteoarthritis.  We talked about the natural course of both tendinitis and osteoarthritis.  Will continue with supportive care for now.  We talked about doing a steroid injection into the subacromial space first, which we can arrange for next week.       Return in about 1 week (around 08/17/2023) for procedure shoulder  injection.    Tyson Alias, MD

## 2023-08-13 ENCOUNTER — Encounter: Payer: Self-pay | Admitting: Student in an Organized Health Care Education/Training Program

## 2023-08-14 ENCOUNTER — Other Ambulatory Visit (HOSPITAL_COMMUNITY): Payer: Self-pay

## 2023-08-17 ENCOUNTER — Encounter: Payer: Self-pay | Admitting: Student in an Organized Health Care Education/Training Program

## 2023-08-17 ENCOUNTER — Ambulatory Visit: Admitting: Student in an Organized Health Care Education/Training Program

## 2023-08-17 VITALS — BP 164/80 | HR 55 | Temp 97.7°F | Ht 66.5 in | Wt 198.5 lb

## 2023-08-17 DIAGNOSIS — M7581 Other shoulder lesions, right shoulder: Secondary | ICD-10-CM

## 2023-08-17 DIAGNOSIS — M1711 Unilateral primary osteoarthritis, right knee: Secondary | ICD-10-CM

## 2023-08-17 DIAGNOSIS — E1142 Type 2 diabetes mellitus with diabetic polyneuropathy: Secondary | ICD-10-CM | POA: Diagnosis not present

## 2023-08-17 DIAGNOSIS — M170 Bilateral primary osteoarthritis of knee: Secondary | ICD-10-CM

## 2023-08-17 LAB — GLUCOSE, POCT (MANUAL RESULT ENTRY): POC Glucose: 188 mg/dL — AB (ref 70–99)

## 2023-08-17 NOTE — Assessment & Plan Note (Signed)
 Shoulder Injection Procedure Note  Pre-operative Diagnosis: right rotator cuff tendinitis  Procedure Details   Point of care ultrasound was used to identify the subacromial bursa and evaluate for rotator cuff tendinopathy or tears. Consent was obtained for the procedure. The shoulder was prepped with iodine. Using a 25 gauge needle the subacromial bursa was injected with 1 mL 1% lidocaine and 20 mg of triamcinolone (KENALOG) 40mg /ml. The needle was removed and a dressing was applied.  Complications:  None; patient tolerated the procedure well.

## 2023-08-17 NOTE — Progress Notes (Signed)
   Acute Office Visit  Subjective:     Patient ID: Tyler Bridges, male    DOB: 1946/02/13, 78 y.o.   MRN: [id number]  Chief Complaint  Patient presents with   Injections    Pt notes here for shoulder and knee injection.     HPI  Patient is in today for procedure only visit, requesting steroid injections into his right shoulder for rotator cuff tendinitis in his right knee for right knee osteoarthritis.  He has never had an injection into his right shoulder.  Last injection into the right knee was in August 2024.  Reports good benefit from these, denies any side effects in the past.      Objective:    BP (!) 164/80   Pulse (!) 55   Temp 97.7 F (36.5 C) (Temporal)   Ht 5' 6.5" (1.689 m)   Wt 198 lb 8 oz (90 kg)   SpO2 98%   BMI 31.56 kg/m    Physical Exam  Results for orders placed or performed in visit on 08/17/23  POCT glucose (manual entry)  Result Value Ref Range   POC Glucose 188 (A) 70 - 99 mg/dl        Assessment & Plan:   Problem List Items Addressed This Visit       High   Type 2 diabetes mellitus (HCC) - Primary (Chronic)   Relevant Orders   POCT glucose (manual entry) (Completed)     Medium    Bilateral primary osteoarthritis of knee (Chronic)   Knee Injection Procedure Note  Diagnosis: right knee osteoarthritis  Procedure Details   Consent was obtained for the procedure. The joint was prepped with Betadine. A 25 gauge needle was inserted into the superior aspect of the joint from a lateral approach to access the suprapatellar pouch. 1 ml 1% lidocaine and 20 mg of Triamcinolone was then injected into the joint through the same needle. The needle was removed and the area cleansed and dressed.  Complications:  None; patient tolerated the procedure well.         Unprioritized   Right rotator cuff tendinitis   Shoulder Injection Procedure Note  Pre-operative Diagnosis: right rotator cuff tendinitis  Procedure Details   Point of care  ultrasound was used to identify the subacromial bursa and evaluate for rotator cuff tendinopathy or tears. Consent was obtained for the procedure. The shoulder was prepped with iodine. Using a 25 gauge needle the subacromial bursa was injected with 1 mL 1% lidocaine and 20 mg of triamcinolone (KENALOG) 40mg /ml. The needle was removed and a dressing was applied.  Complications:  None; patient tolerated the procedure well.        No orders of the defined types were placed in this encounter.   No follow-ups on file.  Tyson Alias, MD

## 2023-08-17 NOTE — Assessment & Plan Note (Signed)
 Knee Injection Procedure Note  Diagnosis: right knee osteoarthritis  Procedure Details   Consent was obtained for the procedure. The joint was prepped with Betadine. A 25 gauge needle was inserted into the superior aspect of the joint from a lateral approach to access the suprapatellar pouch. 1 ml 1% lidocaine and 20 mg of Triamcinolone was then injected into the joint through the same needle. The needle was removed and the area cleansed and dressed.  Complications:  None; patient tolerated the procedure well.

## 2023-09-13 ENCOUNTER — Other Ambulatory Visit (HOSPITAL_BASED_OUTPATIENT_CLINIC_OR_DEPARTMENT_OTHER): Payer: Self-pay

## 2023-09-17 ENCOUNTER — Other Ambulatory Visit (HOSPITAL_BASED_OUTPATIENT_CLINIC_OR_DEPARTMENT_OTHER): Payer: Self-pay

## 2023-09-17 ENCOUNTER — Other Ambulatory Visit: Payer: Self-pay | Admitting: Student in an Organized Health Care Education/Training Program

## 2023-09-17 ENCOUNTER — Other Ambulatory Visit (HOSPITAL_COMMUNITY): Payer: Self-pay

## 2023-09-17 NOTE — Telephone Encounter (Signed)
 NO LONGER [facility] PATIENT OR PROVIDER

## 2023-09-19 ENCOUNTER — Other Ambulatory Visit: Payer: Self-pay | Admitting: Student in an Organized Health Care Education/Training Program

## 2023-09-19 ENCOUNTER — Other Ambulatory Visit (HOSPITAL_COMMUNITY): Payer: Self-pay

## 2023-09-19 ENCOUNTER — Other Ambulatory Visit (HOSPITAL_BASED_OUTPATIENT_CLINIC_OR_DEPARTMENT_OTHER): Payer: Self-pay

## 2023-09-20 ENCOUNTER — Other Ambulatory Visit (HOSPITAL_COMMUNITY): Payer: Self-pay

## 2023-09-20 ENCOUNTER — Encounter (HOSPITAL_COMMUNITY): Payer: Self-pay

## 2023-09-20 ENCOUNTER — Other Ambulatory Visit: Payer: Self-pay | Admitting: Student in an Organized Health Care Education/Training Program

## 2023-09-20 ENCOUNTER — Other Ambulatory Visit (HOSPITAL_BASED_OUTPATIENT_CLINIC_OR_DEPARTMENT_OTHER): Payer: Self-pay

## 2023-09-26 ENCOUNTER — Other Ambulatory Visit: Payer: Self-pay | Admitting: Student in an Organized Health Care Education/Training Program

## 2023-09-26 ENCOUNTER — Other Ambulatory Visit (HOSPITAL_BASED_OUTPATIENT_CLINIC_OR_DEPARTMENT_OTHER): Payer: Self-pay

## 2023-09-26 ENCOUNTER — Telehealth: Payer: Self-pay

## 2023-09-26 DIAGNOSIS — K2100 Gastro-esophageal reflux disease with esophagitis, without bleeding: Secondary | ICD-10-CM

## 2023-09-26 NOTE — Telephone Encounter (Signed)
Please review medication refill

## 2023-09-26 NOTE — Telephone Encounter (Signed)
 Copied from CRM [id number]. Topic: Clinical - Medication Question >> Sep 26, 2023 12:03 PM Adaysia C wrote: Reason for CRM: Deborah(Patients wife) called to ask if PCP can submit a medication renewal refill for the Rx:pantoprazole  (PROTONIX ) 40 MG tablet because the patient was seen in the office on 08/17/2023 to preferred pharmacy [facility] [address] [place] [address] Phone: [phone]  Fax: [phone]  Please follow up with Deborah(pt's wife) regarding this request [phone]

## 2023-09-27 ENCOUNTER — Other Ambulatory Visit: Payer: Self-pay

## 2023-09-27 ENCOUNTER — Other Ambulatory Visit (HOSPITAL_BASED_OUTPATIENT_CLINIC_OR_DEPARTMENT_OTHER): Payer: Self-pay

## 2023-09-27 MED FILL — Pantoprazole Sodium EC Tab 40 MG (Base Equiv): ORAL | 90 days supply | Qty: 180 | Fill #0 | Status: AC

## 2023-09-27 NOTE — Telephone Encounter (Signed)
 Called patients wife to inform her the medication for her husband was sent in, Did leave a Vm to return call.

## 2023-09-27 NOTE — Telephone Encounter (Signed)
 Protonix  refilled. Thank you.

## 2023-09-28 NOTE — Telephone Encounter (Signed)
 LM to call back to discuss this

## 2023-10-02 ENCOUNTER — Encounter: Payer: Self-pay | Admitting: Student in an Organized Health Care Education/Training Program

## 2023-10-02 NOTE — Telephone Encounter (Signed)
 [employer] letter sent to patient, attempted to call three times.

## 2023-10-02 NOTE — Telephone Encounter (Signed)
 Called patients wife to check in to make sure they did pick up this medication, left VM

## 2023-10-24 ENCOUNTER — Other Ambulatory Visit (HOSPITAL_BASED_OUTPATIENT_CLINIC_OR_DEPARTMENT_OTHER): Payer: Self-pay

## 2023-11-12 ENCOUNTER — Encounter: Payer: Self-pay | Admitting: Student in an Organized Health Care Education/Training Program

## 2023-11-12 ENCOUNTER — Other Ambulatory Visit (INDEPENDENT_AMBULATORY_CARE_PROVIDER_SITE_OTHER): Admitting: Pharmacist

## 2023-11-12 ENCOUNTER — Other Ambulatory Visit (HOSPITAL_BASED_OUTPATIENT_CLINIC_OR_DEPARTMENT_OTHER): Payer: Self-pay

## 2023-11-12 ENCOUNTER — Ambulatory Visit (INDEPENDENT_AMBULATORY_CARE_PROVIDER_SITE_OTHER): Admitting: Student in an Organized Health Care Education/Training Program

## 2023-11-12 VITALS — BP 135/65 | HR 63 | Wt 182.0 lb

## 2023-11-12 DIAGNOSIS — I10 Essential (primary) hypertension: Secondary | ICD-10-CM | POA: Diagnosis not present

## 2023-11-12 DIAGNOSIS — E1142 Type 2 diabetes mellitus with diabetic polyneuropathy: Secondary | ICD-10-CM

## 2023-11-12 DIAGNOSIS — Z7984 Long term (current) use of oral hypoglycemic drugs: Secondary | ICD-10-CM

## 2023-11-12 DIAGNOSIS — M170 Bilateral primary osteoarthritis of knee: Secondary | ICD-10-CM

## 2023-11-12 DIAGNOSIS — M7581 Other shoulder lesions, right shoulder: Secondary | ICD-10-CM

## 2023-11-12 LAB — POCT GLYCOSYLATED HEMOGLOBIN (HGB A1C): Hemoglobin A1C: 8 % — AB (ref 4.0–5.6)

## 2023-11-12 NOTE — Assessment & Plan Note (Signed)
 Right knee discomfort due to cartilage loss. Prefers to avoid surgery due to past experiences. - Consider repeat steroid injection every three months as needed. - Discuss alternative treatments, emphasizing weak evidence for efficacy. - Avoid knee replacement surgery unless symptoms become unmanageable.

## 2023-11-12 NOTE — Assessment & Plan Note (Signed)
 Chronic and improved after steroid injection 3 months ago.  Very functional.  Will continue supportive care and intermittent steroid injections in the subacromial space as needed.

## 2023-11-12 NOTE — Assessment & Plan Note (Signed)
 A1c improved to 8.0% due to weight loss and dietary changes. Current A1c acceptable for age, prefers to avoid additional medications.  Urine microalbumin negative. - Continue Jardiance . - Monitor A1c every three months. - Consider additional oral medication if A1c increases.

## 2023-11-12 NOTE — Assessment & Plan Note (Signed)
 Chronic and improved with recent weight loss.  Blood pressure today 135/65.  Will continue to monitor, no need for antihypertensive right now.  Negative microalbumin.

## 2023-11-12 NOTE — Progress Notes (Signed)
 Established Patient Office Visit  Subjective   Patient ID: Tyler Bridges, male    DOB: 16-Jul-1945  Age: 78 y.o. MRN: [id number]  Chief Complaint  Patient presents with   Medical Management of Chronic Issues    3 month follow up     HPI  Discussed the use of AI scribe software for clinical note transcription with the patient, who gave verbal consent to proceed.  History of Present Illness Tyler Bridges is a 78 year old male with diabetes who presents with constipation and significant weight loss.  Constipation - Significant constipation with hard stools since the recent addition of Jardiance  to his medication regimen - Dietary modifications, including increased intake of Raisin Bran, have provided some improvement  Unintentional weight loss - Weight decreased from 198 pounds in April to 182 pounds currently, totaling a 16-pound loss over three months - Attributes weight loss to improved diet, reduced meal frequency (two meals per day), and increased physical activity (yard work and household chores)  Musculoskeletal symptoms - History of right knee and right shoulder pain - Received steroid injections in both joints three months ago - Significant improvement in right shoulder symptoms - Persistent mild symptoms in right knee, described as 'a little suspect'  Glycemic control - Diabetes managed with daily Jardiance  - Hemoglobin A1c improved from 9.3% to 8.0% over the past three months - Dietary changes include increased cereal consumption and reduced intake of high-calorie foods such as biscuits and gravy  Hypertension - History of hypertension - Not currently taking any antihypertensive medications       Objective:     BP 135/65   Pulse 63   Wt 182 lb (82.6 kg)   SpO2 95%   BMI 28.94 kg/m    Physical Exam  Physical Exam VITALS: BP- 135/65 MEASUREMENTS: Weight- 182. CHEST: Lungs clear to auscultation, no crackles, no wheezing. CARDIOVASCULAR: Heart  regular rate and rhythm, no murmurs. Ext: Warm, knees have anterior crepitus, bony overgrowth, no effusion bilaterally   Results for orders placed or performed in visit on 11/12/23  POCT glycosylated hemoglobin (Hb A1C)  Result Value Ref Range   Hemoglobin A1C 8.0 (A) 4.0 - 5.6 %   HbA1c POC (<> result, manual entry)     HbA1c, POC (prediabetic range)     HbA1c, POC (controlled diabetic range)        Assessment & Plan:   Assessment and Plan  Problem List Items Addressed This Visit       High   Type 2 diabetes mellitus (HCC) - Primary (Chronic)   A1c improved to 8.0% due to weight loss and dietary changes. Current A1c acceptable for age, prefers to avoid additional medications.  Urine microalbumin negative. - Continue Jardiance . - Monitor A1c every three months. - Consider additional oral medication if A1c increases.      Relevant Orders   POCT glycosylated hemoglobin (Hb A1C) (Completed)   Essential hypertension (Chronic)   Chronic and improved with recent weight loss.  Blood pressure today 135/65.  Will continue to monitor, no need for antihypertensive right now.  Negative microalbumin.        Medium    Bilateral primary osteoarthritis of knee (Chronic)   Right knee discomfort due to cartilage loss. Prefers to avoid surgery due to past experiences. - Consider repeat steroid injection every three months as needed. - Discuss alternative treatments, emphasizing weak evidence for efficacy. - Avoid knee replacement surgery unless symptoms become unmanageable.  Low   Right rotator cuff tendinitis (Chronic)   Chronic and improved after steroid injection 3 months ago.  Very functional.  Will continue supportive care and intermittent steroid injections in the subacromial space as needed.       Return in about 3 months (around 02/12/2024) for diabetes management.    Cleatus Debby Specking, MD

## 2023-11-12 NOTE — Progress Notes (Signed)
   11/12/2023 Name: Tyler Bridges MRN: [id number] DOB: 08/14/1945  Chief Complaint  Patient presents with   Medication Management    Tyler Bridges is a 78 y.o. year old male who presented for a telephone visit.   They were referred to the pharmacist by their PCP for assistance in managing medication access.    Subjective: Received the following message from PCP:  Jerrell Cleatus Ned, MD  Carla Milling, RPH-CPP Hi Linkyn Gobin,  Can you check this person's pharmacy benefit for me? He reports Jardiance  is costing him over $70 for one month supply, and that [employer]TA is not covering the medicine. He denied it was an issue with deductible. Is there a better covered SGLT2 inhibitor for him?  Thanks,  Cleatus   Medication Access/Adherence  Current Pharmacy:  [facility] [address] [place] [address] Phone: [phone] Fax: [phone]   Patient reports affordability concerns with their medications: Yes  Patient reports access/transportation concerns to their pharmacy: No  Patient reports adherence concerns with their medications:  No      Objective:  Lab Results  Component Value Date   HGBA1C 8.0 (A) 11/12/2023    Lab Results  Component Value Date   CREATININE 0.81 08/10/2023   BUN 12 08/10/2023   NA 134 (L) 08/10/2023   K 4.3 08/10/2023   CL 99 08/10/2023   CO2 24 08/10/2023    Lab Results  Component Value Date   CHOL 155 08/10/2023   HDL 32.90 (L) 08/10/2023   LDLCALC 62 08/10/2023   LDLDIRECT 60 08/03/2014   TRIG 300.0 (H) 08/10/2023   CHOLHDL 5 08/10/2023    Medications Reviewed Today     Reviewed by Carla Milling, RPH-CPP (Pharmacist) on 11/12/23 at 1350  Med List Status: <None>   Medication Order Taking? Sig Documenting Provider Last Dose Status Informant  Cyanocobalamin (B-12 PO) [id number]  Take 1,250 mcg by mouth daily. [provider]  Active Spouse/Significant Other  empagliflozin   (JARDIANCE ) 10 MG TABS tablet [id number]  Take 1 tablet (10 mg total) by mouth daily before breakfast. Jerrell Cleatus Ned, MD  Active   Garlic 2000 MG TBEC [id number]  Take 2,000 mg by mouth daily. [provider]  Active   pantoprazole  (PROTONIX ) 40 MG tablet [id number]  Take 1 tablet (40 mg total) by mouth 2 (two) times daily. Jerrell Cleatus Ned, MD  Active               Assessment/Plan:   Medication Management: - Screened for medication assistance program for Jardiance  - per report of HH of [facility], their yearly income does qualify for medication assistance program with [employer]. Forwarding to Med Assist team to send application to patient and provider.  - Discussed [employer] plan - his 2025 plan. Cost of Jardiance  is $47 / 30 days or $117.50 / 100 day supply. Patient preferred to continue with $47 / 30 days for now.  - Discussed they could consider [employer] Heart and Diabetes plan in future - could have $0 copay for diabetes related medications.   Follow Up Plan: 2 to 4 weeks  Milling Carla, PharmD Clinical Pharmacist [facility]  Population Health [phone]

## 2023-11-12 NOTE — Patient Instructions (Signed)
  VISIT SUMMARY: Today, we discussed your recent health concerns, including constipation, weight loss, knee and shoulder pain, and diabetes management. We reviewed your current medications and made some adjustments to help improve your symptoms and overall health.  YOUR PLAN: -TYPE 2 DIABETES MELLITUS: Type 2 diabetes is a condition where your body does not use insulin  properly, leading to high blood sugar levels. Your A1c has improved to 8.0% due to weight loss and dietary changes, which is acceptable for your age. Continue taking Jardiance  and monitor your A1c every three months. If your A1c exceeds 8.5%, we may consider adding another oral medication.  -CONSTIPATION: Constipation is when you have infrequent or hard-to-pass bowel movements. Your constipation may be linked to increased cereal consumption rather than Jardiance . To help with this, take 1-2 Senna tablets daily in the morning. If Senna is not effective, you can add Miralax .  -BILATERAL PRIMARY OSTEOARTHRITIS OF KNEE: Osteoarthritis is a condition where the cartilage in your joints wears down, causing pain and stiffness. Your right knee discomfort is due to cartilage loss. We can consider repeating steroid injections every three months as needed. We also discussed alternative treatments, but they have weak evidence for effectiveness. Avoid knee replacement surgery unless your symptoms become unmanageable.  INSTRUCTIONS: Please follow up in three months to monitor your A1c and reassess your symptoms. If you experience any new or worsening symptoms, contact our office.

## 2023-11-13 ENCOUNTER — Telehealth: Payer: Self-pay | Admitting: Student in an Organized Health Care Education/Training Program

## 2023-11-13 ENCOUNTER — Telehealth: Payer: Self-pay

## 2023-11-13 NOTE — Telephone Encounter (Signed)
 Gave pt a call to let him know PAP for Jardiance  will be mailing out to pt address,left a HIPAA VM.

## 2023-11-13 NOTE — Telephone Encounter (Signed)
 Type of form received: Medication Orders for [facility] assistance  Additional comments:   Received by: Fax  Form should be Faxed/mailed to: (address/ fax #) [phone]  Is patient requesting call for pickup: N/A  Form placed:  Labeled & placed in provider bin  Attach charge sheet.  Provider will determine charge.  Individual made aware of 3-5 business day turn around? N/A

## 2023-11-14 NOTE — Telephone Encounter (Signed)
 Forms given to provider  Forms completed and faxed back

## 2023-11-14 NOTE — Telephone Encounter (Signed)
 Received provider portion today,awaiting on pt portion.

## 2023-12-06 ENCOUNTER — Other Ambulatory Visit: Payer: Self-pay | Admitting: Pharmacist

## 2023-12-06 ENCOUNTER — Telehealth (HOSPITAL_BASED_OUTPATIENT_CLINIC_OR_DEPARTMENT_OTHER): Payer: Self-pay | Admitting: Pharmacist

## 2023-12-06 NOTE — Telephone Encounter (Signed)
 Gave [employer] a call to follow up on pt PAP Jardiance  per representative pt has been APPROVED trhu 05/07/2024,they have mail out a refill on 7/23 to pt home address.

## 2023-12-06 NOTE — Telephone Encounter (Signed)
 Attempt was made to contact patient by phone today for follow up by Clinical Pharmacist regarding medication access and diabetes.  Unable to reach patient. LM on VM with my contact number [phone].

## 2023-12-08 ENCOUNTER — Other Ambulatory Visit: Payer: Self-pay | Admitting: Student in an Organized Health Care Education/Training Program

## 2023-12-08 DIAGNOSIS — K2100 Gastro-esophageal reflux disease with esophagitis, without bleeding: Secondary | ICD-10-CM

## 2023-12-10 ENCOUNTER — Other Ambulatory Visit (HOSPITAL_BASED_OUTPATIENT_CLINIC_OR_DEPARTMENT_OTHER): Payer: Self-pay

## 2023-12-10 DIAGNOSIS — M1712 Unilateral primary osteoarthritis, left knee: Secondary | ICD-10-CM | POA: Diagnosis not present

## 2023-12-10 DIAGNOSIS — M170 Bilateral primary osteoarthritis of knee: Secondary | ICD-10-CM | POA: Diagnosis not present

## 2023-12-10 DIAGNOSIS — M1711 Unilateral primary osteoarthritis, right knee: Secondary | ICD-10-CM | POA: Diagnosis not present

## 2023-12-10 DIAGNOSIS — G8929 Other chronic pain: Secondary | ICD-10-CM | POA: Diagnosis not present

## 2023-12-10 MED ORDER — CELECOXIB 200 MG PO CAPS
200.0000 mg | ORAL_CAPSULE | Freq: Every day | ORAL | 1 refills | Status: DC
  Filled 2023-12-10: qty 30, 30d supply, fill #0
  Filled 2024-01-04 – 2024-01-05 (×2): qty 30, 30d supply, fill #1

## 2023-12-10 MED ORDER — PANTOPRAZOLE SODIUM 40 MG PO TBEC
40.0000 mg | DELAYED_RELEASE_TABLET | Freq: Two times a day (BID) | ORAL | 0 refills | Status: DC
  Filled 2023-12-10: qty 180, 90d supply, fill #0

## 2024-01-05 ENCOUNTER — Other Ambulatory Visit (HOSPITAL_BASED_OUTPATIENT_CLINIC_OR_DEPARTMENT_OTHER): Payer: Self-pay

## 2024-02-01 ENCOUNTER — Ambulatory Visit: Admitting: Student in an Organized Health Care Education/Training Program

## 2024-02-05 DIAGNOSIS — E119 Type 2 diabetes mellitus without complications: Secondary | ICD-10-CM | POA: Diagnosis not present

## 2024-02-05 DIAGNOSIS — I739 Peripheral vascular disease, unspecified: Secondary | ICD-10-CM | POA: Diagnosis not present

## 2024-02-05 DIAGNOSIS — H6123 Impacted cerumen, bilateral: Secondary | ICD-10-CM | POA: Diagnosis not present

## 2024-02-05 DIAGNOSIS — L600 Ingrowing nail: Secondary | ICD-10-CM | POA: Diagnosis not present

## 2024-02-05 DIAGNOSIS — Q667 Congenital pes cavus, unspecified foot: Secondary | ICD-10-CM | POA: Diagnosis not present

## 2024-02-05 DIAGNOSIS — Z77122 Contact with and (suspected) exposure to noise: Secondary | ICD-10-CM | POA: Diagnosis not present

## 2024-02-05 DIAGNOSIS — H9313 Tinnitus, bilateral: Secondary | ICD-10-CM | POA: Diagnosis not present

## 2024-02-07 ENCOUNTER — Other Ambulatory Visit (HOSPITAL_BASED_OUTPATIENT_CLINIC_OR_DEPARTMENT_OTHER): Payer: Self-pay

## 2024-02-08 ENCOUNTER — Other Ambulatory Visit (HOSPITAL_BASED_OUTPATIENT_CLINIC_OR_DEPARTMENT_OTHER): Payer: Self-pay

## 2024-02-11 ENCOUNTER — Other Ambulatory Visit (HOSPITAL_BASED_OUTPATIENT_CLINIC_OR_DEPARTMENT_OTHER): Payer: Self-pay

## 2024-02-11 MED ORDER — CELECOXIB 200 MG PO CAPS
200.0000 mg | ORAL_CAPSULE | Freq: Every day | ORAL | 1 refills | Status: DC
  Filled 2024-02-11: qty 30, 30d supply, fill #0
  Filled 2024-03-10: qty 30, 30d supply, fill #1

## 2024-02-12 ENCOUNTER — Other Ambulatory Visit (HOSPITAL_BASED_OUTPATIENT_CLINIC_OR_DEPARTMENT_OTHER): Payer: Self-pay

## 2024-02-12 MED ORDER — SULFAMETHOXAZOLE-TRIMETHOPRIM 800-160 MG PO TABS
1.0000 | ORAL_TABLET | Freq: Two times a day (BID) | ORAL | 0 refills | Status: AC
  Filled 2024-02-12: qty 14, 7d supply, fill #0

## 2024-02-21 DIAGNOSIS — E119 Type 2 diabetes mellitus without complications: Secondary | ICD-10-CM | POA: Diagnosis not present

## 2024-02-21 DIAGNOSIS — L600 Ingrowing nail: Secondary | ICD-10-CM | POA: Diagnosis not present

## 2024-02-21 DIAGNOSIS — Q667 Congenital pes cavus, unspecified foot: Secondary | ICD-10-CM | POA: Diagnosis not present

## 2024-02-21 DIAGNOSIS — I739 Peripheral vascular disease, unspecified: Secondary | ICD-10-CM | POA: Diagnosis not present

## 2024-03-11 ENCOUNTER — Other Ambulatory Visit: Payer: Self-pay | Admitting: Student in an Organized Health Care Education/Training Program

## 2024-03-11 ENCOUNTER — Other Ambulatory Visit (HOSPITAL_BASED_OUTPATIENT_CLINIC_OR_DEPARTMENT_OTHER): Payer: Self-pay

## 2024-03-11 ENCOUNTER — Encounter: Payer: Self-pay | Admitting: Student in an Organized Health Care Education/Training Program

## 2024-03-11 DIAGNOSIS — K2100 Gastro-esophageal reflux disease with esophagitis, without bleeding: Secondary | ICD-10-CM

## 2024-03-11 MED ORDER — PANTOPRAZOLE SODIUM 40 MG PO TBEC
40.0000 mg | DELAYED_RELEASE_TABLET | Freq: Two times a day (BID) | ORAL | 0 refills | Status: DC
  Filled 2024-03-11: qty 180, 90d supply, fill #0

## 2024-03-11 NOTE — Telephone Encounter (Unsigned)
 Copied from CRM [id number]. Topic: Clinical - Medication Refill >> Mar 11, 2024 10:59 AM Carlyon D wrote: Medication:  pantoprazole  (PROTONIX ) 40 MG tablet   Has the patient contacted their pharmacy? Yes (Agent: If no, request that the patient contact the pharmacy for the refill. If patient does not wish to contact the pharmacy document the reason why and proceed with request.) (Agent: If yes, when and what did the pharmacy advise?)  This is the patient's preferred pharmacy:  [facility] [address] [place] [address] Phone: [phone] Fax: [phone]  Is this the correct pharmacy for this prescription? Yes If no, delete pharmacy and type the correct one.   Has the prescription been filled recently? No  Is the patient out of the medication? Has 2 left   Has the patient been seen for an appointment in the last year OR does the patient have an upcoming appointment? Yes  Can we respond through MyChart? No, prefers call   Agent: Please be advised that Rx refills may take up to 3 business days. We ask that you follow-up with your pharmacy.

## 2024-03-11 NOTE — Telephone Encounter (Signed)
 Called patient to schedule appointment with Dr.Vincent for 3 month follow up, Left Vm to return call. Please schedule 3 month follow up with A1C with Dr.Vincent in any opening that best works with patient.

## 2024-03-11 NOTE — Progress Notes (Unsigned)
   03/11/2024 Name: Tyler Bridges MRN: [id number] DOB: [birth year]  No chief complaint on file.   Tyler Bridges is a 78 y.o. year old male who presented for a telephone visit.   They were referred to the pharmacist by their PCP for assistance in managing medication access.    Subjective: Patient was enrolled to received Jardiance  the [employer] program thru 05/07/2024 but [facility] reached out to me today - patient's wife was there to pick up Jardiance  and she was told there was a $47 copay but patient and his wife are stating that cost should be $0.    Medication Access/Adherence  Current Pharmacy:  [facility] [address] [place] [address] Phone: [phone] Fax: [phone]   Patient reports affordability concerns with their medications: Yes  Patient reports access/transportation concerns to their pharmacy: No  Patient reports adherence concerns with their medications:  No      Objective:  Lab Results  Component Value Date   HGBA1C 8.0 (A) 11/12/2023    Lab Results  Component Value Date   CREATININE 0.81 08/10/2023   BUN 12 08/10/2023   NA 134 (L) 08/10/2023   K 4.3 08/10/2023   CL 99 08/10/2023   CO2 24 08/10/2023    Lab Results  Component Value Date   CHOL 155 08/10/2023   HDL 32.90 (L) 08/10/2023   LDLCALC 62 08/10/2023   LDLDIRECT 60 08/03/2014   TRIG 300.0 (H) 08/10/2023   CHOLHDL 5 08/10/2023    Medications Reviewed Today     Reviewed by Carla Milling, RPH-CPP (Pharmacist) on 03/11/24 at 1409  Med List Status: <None>   Medication Order Taking? Sig Documenting Provider Last Dose Status Informant  celecoxib  (CELEBREX ) 200 MG capsule [id number]  Take 1 capsule (200 mg total) by mouth daily.   Active   Cyanocobalamin (B-12 PO) [id number]  Take 1,250 mcg by mouth daily. [provider]  Active Spouse/Significant Other  empagliflozin  (JARDIANCE ) 10 MG TABS tablet [id number]  Take 1  tablet (10 mg total) by mouth daily before breakfast. Jerrell Cleatus Ned, MD  Active   Garlic 2000 MG TBEC [id number]  Take 2,000 mg by mouth daily. [provider]  Active   pantoprazole  (PROTONIX ) 40 MG tablet [id number]  Take 1 tablet (40 mg total) by mouth 2 (two) times daily. Webb, Padonda B, FNP  Active   sulfamethoxazole-trimethoprim (BACTRIM DS) 800-160 MG tablet [id number]  Take 1 tablet by mouth 2 (two) times daily.   Active               Assessment/Plan:   Medication Management: - Assisted in reordering Jardiance  form [employer] medication assistance program. Patient will receive delivery in about 5 to 7 days.  - Discussed 2026 re-enrollment with [employer]. Patient's wife had complete patient portion of application and mailed it back to [facility].  Forwarding to Med Assist team to start provider portion.  - Patient is due to follow up with PCP and have A1c rechecked - forwarding to clinical team to outreach to schedule appt.   Follow Up Plan:  4 weeks  Milling Carla, PharmD Clinical Pharmacist [facility] Primary Care  Population Health [phone]

## 2024-03-17 DIAGNOSIS — G8929 Other chronic pain: Secondary | ICD-10-CM | POA: Diagnosis not present

## 2024-03-17 DIAGNOSIS — M25561 Pain in right knee: Secondary | ICD-10-CM | POA: Diagnosis not present

## 2024-03-17 DIAGNOSIS — M25562 Pain in left knee: Secondary | ICD-10-CM | POA: Diagnosis not present

## 2024-03-20 ENCOUNTER — Other Ambulatory Visit (HOSPITAL_BASED_OUTPATIENT_CLINIC_OR_DEPARTMENT_OTHER): Payer: Self-pay

## 2024-03-26 ENCOUNTER — Telehealth: Payer: Self-pay | Admitting: Student in an Organized Health Care Education/Training Program

## 2024-03-26 NOTE — Telephone Encounter (Signed)
 Type of form received: Incomplete PAP Application - Missing Info   Additional comments:   Received by: Fax  Form should be Faxed/mailed to: (address/ fax #) [phone]  Is patient requesting call for pickup: N/A  Form placed:  Labeled & placed in provider bin  Attach charge sheet.  Provider will determine charge.  Individual made aware of 3-5 business day turn around? N/A

## 2024-03-26 NOTE — Telephone Encounter (Signed)
 Letter placed in folder at front desk of [facility]

## 2024-03-26 NOTE — Telephone Encounter (Signed)
 Can we please forward this application update to Tammy Eckard?

## 2024-03-26 NOTE — Telephone Encounter (Signed)
Placed on desk for review and signature.

## 2024-03-27 NOTE — Telephone Encounter (Unsigned)
 Copied from CRM [id number]. Topic: General - Other >> Mar 27, 2024  1:12 PM Tyler Bridges GRADE wrote: Reason for CRM: [employer] assistance program is calling because of missing pages 4-5 along with the prescription page to be faxed to them [phone].

## 2024-03-28 ENCOUNTER — Telehealth: Payer: Self-pay | Admitting: Student in an Organized Health Care Education/Training Program

## 2024-03-28 ENCOUNTER — Telehealth: Payer: Self-pay

## 2024-03-28 NOTE — Telephone Encounter (Signed)
 Gave Bicares a call following up request from [employer] Tammy on [facility]icares missing imf. Spoke with [employer] representative said they have received pt portion of 2026 re-enrollment but missing provider portion,I have filled and faxed provider portion to sign and date can faxed back to [phone].

## 2024-03-28 NOTE — Telephone Encounter (Signed)
 Type of form received: PAP Application Form  Additional comments:   Received by: Fax  Form should be Faxed/mailed to: (address/ fax #) [phone]  Is patient requesting call for pickup: N/A  Form placed:  Labeled & placed in provider bin  Attach charge sheet.  Provider will determine charge.  Individual made aware of 3-5 business day turn around? N/A

## 2024-03-28 NOTE — Telephone Encounter (Signed)
Placed on providers desk for signature.

## 2024-03-31 NOTE — Telephone Encounter (Signed)
 Faxed back to the requested number on form

## 2024-03-31 NOTE — Telephone Encounter (Signed)
 Signed and placed in MA basket.  Thank you.

## 2024-04-01 NOTE — Telephone Encounter (Signed)
 Received provider portion Bicares (Jardiance ) faxed to [facility] today.

## 2024-04-10 ENCOUNTER — Other Ambulatory Visit (HOSPITAL_BASED_OUTPATIENT_CLINIC_OR_DEPARTMENT_OTHER): Payer: Self-pay

## 2024-04-10 ENCOUNTER — Other Ambulatory Visit: Payer: Self-pay | Admitting: Pharmacist

## 2024-04-10 ENCOUNTER — Telehealth: Payer: Self-pay | Admitting: Pharmacist

## 2024-04-10 NOTE — Telephone Encounter (Signed)
 Attempt was made to contact patient by phone today for follow up by Clinical Pharmacist regarding Jardiance  patient assistance program application for 2026.   Unable to reach patient. LM on VM with my contact number [phone].   Called his home number and spoke with his wife. She states they did complete application and it was sent back to our office.   Called [employer] to check status of application. Patient has been approved thru 05/07/2025.  Notified patient's wife and Med Assist Team.

## 2024-04-11 ENCOUNTER — Other Ambulatory Visit (HOSPITAL_BASED_OUTPATIENT_CLINIC_OR_DEPARTMENT_OTHER): Payer: Self-pay

## 2024-04-12 ENCOUNTER — Other Ambulatory Visit (HOSPITAL_BASED_OUTPATIENT_CLINIC_OR_DEPARTMENT_OTHER): Payer: Self-pay

## 2024-04-12 MED ORDER — CELECOXIB 200 MG PO CAPS
200.0000 mg | ORAL_CAPSULE | Freq: Every day | ORAL | 1 refills | Status: AC
  Filled 2024-04-12: qty 30, 30d supply, fill #0
  Filled 2024-05-12: qty 30, 30d supply, fill #1

## 2024-05-16 ENCOUNTER — Other Ambulatory Visit (HOSPITAL_BASED_OUTPATIENT_CLINIC_OR_DEPARTMENT_OTHER): Payer: Self-pay

## 2024-05-30 ENCOUNTER — Other Ambulatory Visit (HOSPITAL_BASED_OUTPATIENT_CLINIC_OR_DEPARTMENT_OTHER): Payer: Self-pay

## 2024-06-13 ENCOUNTER — Other Ambulatory Visit: Payer: Self-pay | Admitting: Family

## 2024-06-13 ENCOUNTER — Other Ambulatory Visit (HOSPITAL_BASED_OUTPATIENT_CLINIC_OR_DEPARTMENT_OTHER): Payer: Self-pay

## 2024-06-13 DIAGNOSIS — K2100 Gastro-esophageal reflux disease with esophagitis, without bleeding: Secondary | ICD-10-CM

## 2024-06-13 MED ORDER — PANTOPRAZOLE SODIUM 40 MG PO TBEC
40.0000 mg | DELAYED_RELEASE_TABLET | Freq: Two times a day (BID) | ORAL | 0 refills | Status: AC
  Filled 2024-06-13: qty 180, 90d supply, fill #0
